# Patient Record
Sex: Male | Born: 1975
Health system: Southern US, Community
[De-identification: ages and names within clinical notes are randomized; demographics above are authoritative.]

## PROBLEM LIST (undated history)

## (undated) DIAGNOSIS — T7840XA Allergy, unspecified, initial encounter: Secondary | ICD-10-CM

## (undated) DIAGNOSIS — A770 Spotted fever due to Rickettsia rickettsii: Secondary | ICD-10-CM

## (undated) DIAGNOSIS — F419 Anxiety disorder, unspecified: Secondary | ICD-10-CM

## (undated) DIAGNOSIS — G039 Meningitis, unspecified: Secondary | ICD-10-CM

## (undated) HISTORY — DX: Allergy, unspecified, initial encounter: T78.40XA

## (undated) HISTORY — PX: WISDOM TOOTH EXTRACTION: SHX21

---

## 2009-01-19 ENCOUNTER — Emergency Department: Payer: Self-pay | Admitting: Emergency Medicine

## 2009-01-30 ENCOUNTER — Emergency Department: Payer: Self-pay | Admitting: Unknown Physician Specialty

## 2014-12-12 ENCOUNTER — Observation Stay
Admission: EM | Admit: 2014-12-12 | Discharge: 2014-12-14 | Disposition: A | Discharge status: Home / Self Care | Payer: BLUE CROSS/BLUE SHIELD | Attending: Internal Medicine | Admitting: Internal Medicine

## 2014-12-12 ENCOUNTER — Encounter: Payer: Self-pay | Admitting: Emergency Medicine

## 2014-12-12 DIAGNOSIS — R51 Headache: Secondary | ICD-10-CM | POA: Insufficient documentation

## 2014-12-12 DIAGNOSIS — R110 Nausea: Secondary | ICD-10-CM | POA: Insufficient documentation

## 2014-12-12 DIAGNOSIS — J0120 Acute ethmoidal sinusitis, unspecified: Secondary | ICD-10-CM | POA: Diagnosis not present

## 2014-12-12 DIAGNOSIS — J019 Acute sinusitis, unspecified: Secondary | ICD-10-CM | POA: Diagnosis present

## 2014-12-12 DIAGNOSIS — R509 Fever, unspecified: Secondary | ICD-10-CM | POA: Insufficient documentation

## 2014-12-12 DIAGNOSIS — Z23 Encounter for immunization: Secondary | ICD-10-CM | POA: Insufficient documentation

## 2014-12-12 DIAGNOSIS — J329 Chronic sinusitis, unspecified: Secondary | ICD-10-CM | POA: Diagnosis present

## 2014-12-12 DIAGNOSIS — M542 Cervicalgia: Secondary | ICD-10-CM | POA: Diagnosis not present

## 2014-12-12 DIAGNOSIS — G039 Meningitis, unspecified: Secondary | ICD-10-CM | POA: Diagnosis present

## 2014-12-12 MED ORDER — MORPHINE SULFATE (PF) 4 MG/ML IV SOLN
4.0000 mg | Freq: Once | INTRAVENOUS | Status: AC
  Administered 2014-12-13: 4 mg via INTRAVENOUS
  Filled 2014-12-12: qty 1

## 2014-12-12 MED ORDER — SODIUM CHLORIDE 0.9 % IV BOLUS (SEPSIS)
1000.0000 mL | Freq: Once | INTRAVENOUS | Status: AC
  Administered 2014-12-13: 1000 mL via INTRAVENOUS

## 2014-12-12 MED ORDER — ONDANSETRON 8 MG PO TBDP
ORAL_TABLET | ORAL | Status: AC
  Administered 2014-12-12: 8 mg via ORAL
  Filled 2014-12-12: qty 1

## 2014-12-12 MED ORDER — ONDANSETRON 8 MG PO TBDP
8.0000 mg | ORAL_TABLET | Freq: Once | ORAL | Status: AC
  Administered 2014-12-12: 8 mg via ORAL

## 2014-12-12 NOTE — ED Provider Notes (Signed)
[facility] Emergency Department Provider Note  ____________________________________________  Time seen: Approximately 11:32 PM  I have reviewed the triage vital signs and the nursing notes.   HISTORY  Chief Complaint Fever    HPI Khalin Royce is a 39 y.o. male resistance the emergency department complaining of nausea, fever, headache, sinus pressure, neck pain. [facility] and diagnosed with a sinus infection. He was given Augmentin at that time as well as Mucinex. He states that he has been taking both prescriptions with no relief. He reports that symptoms have increased especially over the "last few days". Patient reports increasing fever that he has been taking Tylenol and ibuprofen for without relief. He states that he "had to get a cold bath to reduce the fever." Today patient has endorsed increased nausea and vomiting. He states that he has vomited 3. Patient states that his headache is "the worst I've ever had in my life." He also endorses neck pain that "runs down the center of my neck."   History reviewed. No pertinent past medical history.  There are no active problems to display for this patient.   Past Surgical History  Procedure Laterality Date  . Wisdom tooth extraction      Current Outpatient Rx  Name  Route  Sig  Dispense  Refill  . amoxicillin-clavulanate (AUGMENTIN) 875-125 MG tablet   Oral   Take 1 tablet by mouth 2 (two) times daily.         . citalopram (CELEXA) 40 MG tablet   Oral   Take 40 mg by mouth daily.           Allergies Review of patient's allergies indicates not on file.  No family history on file.  Social History Social History  Substance Use Topics  . Smoking status: Never Smoker   . Smokeless tobacco: None  . Alcohol Use: Yes    Review of Systems Constitutional: Endorses high fever/chills Eyes: No visual changes. ENT: He endorses "scratchy" throat.  Endorses severe nasal congestion. He endorses fullness Cardiovascular: Denies chest pain. Respiratory: Denies shortness of breath. Gastrointestinal: No abdominal pain.  No nausea, no vomiting.  No diarrhea.  No constipation. Genitourinary: Negative for dysuria. Musculoskeletal: Negative for back pain. Skin: Negative for rash. Neurological: Negative for headaches, focal weakness or numbness.  10-point ROS otherwise negative.  ____________________________________________   PHYSICAL EXAM:  VITAL SIGNS: ED Triage Vitals  Enc Vitals Group     BP 12/12/14 2227 121/70 mmHg     Pulse Rate 12/12/14 2227 104     Resp 12/12/14 2227 20     Temp 12/12/14 2227 99.8 F (37.7 C)     Temp Source 12/12/14 2227 Oral     SpO2 12/12/14 2227 94 %     Weight 12/12/14 2227 240 lb (108.863 kg)     Height 12/12/14 2227  (1.778 m)     Head Cir --      Peak Flow --      Pain Score 12/12/14 2228 9     Pain Loc --      Pain Edu? --      Excl. in GC? --     Constitutional: Alert and oriented. Ill-appearing and in no mild distress. Eyes: Conjunctivae are normal. PERRL. EOMI. Head: Atraumatic. Ears: No tenderness to palpation of tragus. External ear canals nonerythematous or edematous. Moderate cerumen bilaterally but TMs visible beyond. Mild bulging but no air-fluid level. Nose: Moderate purulent  congestion/rhinnorhea. Tender to palpation over maxillary sinuses. Tender to percussion over frontal sinuses. Mouth/Throat: Mucous membranes are moist.  Oropharynx non-erythematous. Neck: No stridor.  Moderate cervical spine tenderness to palpation. Positive Kernig's and Brudzinski's. Patient is splinting neck. Hematological/Lymphatic/Immunilogical: Diffuse, mobile, anterior cervical lymphadenopathy. Cardiovascular: Normal rate, regular rhythm. Grossly normal heart sounds.  Good peripheral circulation. Respiratory: Normal respiratory effort.  No retractions. Lungs CTAB. Gastrointestinal: Soft and  nontender. No distention. No abdominal bruits. No CVA tenderness. Musculoskeletal: No lower extremity tenderness nor edema.  No joint effusions. Neurologic:  Normal speech and language. No gross focal neurologic deficits are appreciated. No gait instability. Skin:  Skin is very warm to palpation, diaphoretic and intact. No rash noted. Psychiatric: Mood and affect are normal. Speech and behavior are normal.  ____________________________________________   LABS (all labs ordered are listed, but only abnormal results are displayed)  Labs Reviewed - No data to display ____________________________________________  EKG   ____________________________________________  RADIOLOGY   ____________________________________________   PROCEDURES  Procedure(s) performed: None  Critical Care performed: No  ____________________________________________   INITIAL IMPRESSION / ASSESSMENT AND PLAN / ED COURSE  Pertinent labs & imaging results that were available during my care of the patient were reviewed by me and considered in my medical decision making (see chart for details).  The patient is a 39 year old male who presents to the emergency department complaining of nasal congestion, sinus pressure, and severe headache that is described as "worse of his life" and also "runs down the center of my neck". He also endorses a known sinus infection that was treated on 12/06/[facility]. Patient has been taking Augmentin and Mucinex for same with no improvement. Patient reports worsening of symptoms, increasing fever, and increasing headache and neck pain. Upon initial assessment of patient and the patient is a ill-appearing, moderately in distress male patient. During discussion with patient the patient was splinting his neck and moving of upper body to follow provider around the room. Patient was tender to palpation and percussion over sinuses. Moderate purulent rhinorrhea was noted.  Moderately positive Kernig's and Brudzinski's. I consulted with fellow PA Jenise. After both her assessment and we determined that the patient needed a more detailed workup. CBC, CMP, blood cultures were ordered. Patient was given symptomatic medication, Zofran, morphine, ibuprofen, and IV fluids. She did report some improvement with the morphine and Zofran  I discussed the patient's care up to this point with Dr. Zenda Alpers and the patient was transferred up to room 15. ____________________________________________   FINAL CLINICAL IMPRESSION(S) / ED DIAGNOSES  Final diagnoses:  None      Racheal Patches, PA-C 12/13/14 0101  Rebecka Apley, MD 12/13/14 [id number]

## 2014-12-12 NOTE — ED Notes (Signed)
Spoke with Christiane Ha, [place], patient feeling nauseous, new orders placed, see MAR.

## 2014-12-12 NOTE — ED Notes (Addendum)
Patient presents to ED with c/o fever since 9/27, patient was seen at [facility] and prescribed Augmentin. Patient also c/o headache since Thursday, nausea and vomiting since today. Patient reports has not been getting better. Patient took Augmentin and muccinex at noon today and last dose of Tylenol at 6:30 pm tonight without relief. Patient has vomited x 3 today. Patient alert and oriented, respirations even and unlabored, skin hot and dry. Patient denies chest pain, shortness of breath, or abdominal pain.

## 2014-12-13 ENCOUNTER — Emergency Department: Payer: BLUE CROSS/BLUE SHIELD

## 2014-12-13 DIAGNOSIS — J329 Chronic sinusitis, unspecified: Secondary | ICD-10-CM | POA: Diagnosis present

## 2014-12-13 LAB — COMPREHENSIVE METABOLIC PANEL
ALBUMIN: 4.2 g/dL (ref 3.5–5.0)
ALK PHOS: 160 U/L — AB (ref 38–126)
ALT: 225 U/L — ABNORMAL HIGH (ref 17–63)
ANION GAP: 9 (ref 5–15)
AST: 145 U/L — ABNORMAL HIGH (ref 15–41)
BUN: 15 mg/dL (ref 6–20)
CHLORIDE: 104 mmol/L (ref 101–111)
CO2: 25 mmol/L (ref 22–32)
CREATININE: 1.05 mg/dL (ref 0.61–1.24)
Calcium: 9 mg/dL (ref 8.9–10.3)
GFR calc non Af Amer: >60 mL/min (ref >60)
Glucose, Bld: 117 mg/dL — ABNORMAL HIGH (ref 65–99)
Potassium: 4.3 mmol/L (ref 3.5–5.1)
SODIUM: 138 mmol/L (ref 135–145)
Total Bilirubin: 1.4 mg/dL — ABNORMAL HIGH (ref 0.3–1.2)
Total Protein: 7.5 g/dL (ref 6.5–8.1)

## 2014-12-13 LAB — CBC WITH DIFFERENTIAL/PLATELET
BAND NEUTROPHILS: 5 %
BASOS PCT: 0 %
Basophils Absolute: 0 10*3/uL (ref 0–0.1)
Blasts: 0 %
EOS ABS: 0 10*3/uL (ref 0–0.7)
EOS PCT: 0 %
HCT: 46.9 % (ref 40.0–52.0)
HEMOGLOBIN: 15.5 g/dL (ref 13.0–18.0)
LYMPHS ABS: 2.8 10*3/uL (ref 1.0–3.6)
Lymphocytes Relative: 34 %
MCH: 29.1 pg (ref 26.0–34.0)
MCHC: 33.2 g/dL (ref 32.0–36.0)
MCV: 87.7 fL (ref 80.0–100.0)
MONO ABS: 1.1 10*3/uL — AB (ref 0.2–1.0)
MYELOCYTES: 0 %
Metamyelocytes Relative: 0 %
Monocytes Relative: 13 %
Neutro Abs: 4.3 10*3/uL (ref 1.4–6.5)
Neutrophils Relative %: 48 %
OTHER: 0 %
PLATELETS: 133 10*3/uL — AB (ref 150–440)
PROMYELOCYTES ABS: 0 %
RBC: 5.34 MIL/uL (ref 4.40–5.90)
RDW: 14 % (ref 11.5–14.5)
WBC: 8.2 10*3/uL (ref 3.8–10.6)
nRBC: 0 /100 WBC

## 2014-12-13 MED ORDER — CITALOPRAM HYDROBROMIDE 20 MG PO TABS
40.0000 mg | ORAL_TABLET | Freq: Every day | ORAL | Status: DC
  Administered 2014-12-13 – 2014-12-14 (×2): 40 mg via ORAL
  Filled 2014-12-13 (×2): qty 2

## 2014-12-13 MED ORDER — ONDANSETRON HCL 4 MG/2ML IJ SOLN
4.0000 mg | Freq: Once | INTRAMUSCULAR | Status: AC
  Administered 2014-12-13: 4 mg via INTRAVENOUS
  Filled 2014-12-13: qty 2

## 2014-12-13 MED ORDER — DM-GUAIFENESIN ER 30-600 MG PO TB12: 1.0000 | ORAL_TABLET | Freq: Two times a day (BID) | ORAL | Status: DC

## 2014-12-13 MED ORDER — ACETAMINOPHEN 325 MG PO TABS
650.0000 mg | ORAL_TABLET | Freq: Four times a day (QID) | ORAL | Status: DC | PRN
  Administered 2014-12-13: 650 mg via ORAL
  Filled 2014-12-13: qty 2

## 2014-12-13 MED ORDER — DEXTROSE 5 % IV SOLN
2.0000 g | INTRAVENOUS | Status: DC
  Filled 2014-12-13: qty 2

## 2014-12-13 MED ORDER — GUAIFENESIN ER 600 MG PO TB12
600.0000 mg | ORAL_TABLET | Freq: Two times a day (BID) | ORAL | Status: DC
  Administered 2014-12-13 – 2014-12-14 (×3): 600 mg via ORAL
  Filled 2014-12-13 (×3): qty 1

## 2014-12-13 MED ORDER — PREDNISONE 10 MG (21) PO TBPK: 10.0000 mg | ORAL_TABLET | Freq: Four times a day (QID) | ORAL | Status: DC

## 2014-12-13 MED ORDER — DEXTROSE 5 % IV SOLN: 2.0000 g | INTRAVENOUS | Status: DC

## 2014-12-13 MED ORDER — INFLUENZA VAC SPLIT QUAD 0.5 ML IM SUSY
0.5000 mL | PREFILLED_SYRINGE | INTRAMUSCULAR | Status: AC
  Administered 2014-12-14: 0.5 mL via INTRAMUSCULAR
  Filled 2014-12-13: qty 0.5

## 2014-12-13 MED ORDER — PREDNISONE 10 MG (21) PO TBPK
20.0000 mg | ORAL_TABLET | Freq: Every morning | ORAL | Status: AC
  Administered 2014-12-13: 20 mg via ORAL
  Filled 2014-12-13: qty 21

## 2014-12-13 MED ORDER — DEXTROSE 5 % IV SOLN
2.0000 g | INTRAVENOUS | Status: DC
  Administered 2014-12-14: 2 g via INTRAVENOUS
  Filled 2014-12-13: qty 2

## 2014-12-13 MED ORDER — PREDNISONE 10 MG (21) PO TBPK
10.0000 mg | ORAL_TABLET | Freq: Three times a day (TID) | ORAL | Status: DC
  Administered 2014-12-14: 10 mg via ORAL
  Filled 2014-12-13 (×2): qty 21

## 2014-12-13 MED ORDER — PREDNISONE 10 MG (21) PO TBPK: 20.0000 mg | ORAL_TABLET | Freq: Every evening | ORAL | Status: DC

## 2014-12-13 MED ORDER — OXYMETAZOLINE HCL 0.05 % NA SOLN
1.0000 | Freq: Once | NASAL | Status: AC
  Administered 2014-12-13: 1 via NASAL
  Filled 2014-12-13: qty 15

## 2014-12-13 MED ORDER — DEXTROSE 5 % IV SOLN
2.0000 g | Freq: Once | INTRAVENOUS | Status: AC
  Administered 2014-12-13: 2 g via INTRAVENOUS
  Filled 2014-12-13: qty 2

## 2014-12-13 MED ORDER — ENOXAPARIN SODIUM 40 MG/0.4ML ~~LOC~~ SOLN
40.0000 mg | SUBCUTANEOUS | Status: DC
  Administered 2014-12-13: 40 mg via SUBCUTANEOUS
  Filled 2014-12-13: qty 0.4

## 2014-12-13 MED ORDER — BUTALBITAL-APAP-CAFFEINE 50-325-40 MG PO TABS
2.0000 | ORAL_TABLET | Freq: Once | ORAL | Status: AC
  Administered 2014-12-13: 2 via ORAL
  Filled 2014-12-13: qty 2

## 2014-12-13 MED ORDER — HYDROCODONE-ACETAMINOPHEN 5-325 MG PO TABS
1.0000 | ORAL_TABLET | ORAL | Status: DC | PRN
  Administered 2014-12-13: 2 via ORAL
  Administered 2014-12-13 – 2014-12-14 (×3): 1 via ORAL
  Filled 2014-12-13: qty 1
  Filled 2014-12-13: qty 2
  Filled 2014-12-13 (×2): qty 1

## 2014-12-13 MED ORDER — LORAZEPAM 2 MG PO TABS
2.0000 mg | ORAL_TABLET | Freq: Once | ORAL | Status: AC
  Administered 2014-12-13: 2 mg via ORAL

## 2014-12-13 MED ORDER — SODIUM CHLORIDE 0.9 % IV SOLN
Freq: Once | INTRAVENOUS | Status: AC
  Administered 2014-12-13: 10:00:00 via INTRAVENOUS

## 2014-12-13 MED ORDER — PREDNISONE 10 MG (21) PO TBPK
10.0000 mg | ORAL_TABLET | ORAL | Status: AC
  Administered 2014-12-13: 10 mg via ORAL

## 2014-12-13 MED ORDER — SODIUM CHLORIDE 0.9 % IV BOLUS (SEPSIS)
INTRAVENOUS | Status: AC
Start: 2014-12-13 — End: 2014-12-13
  Administered 2014-12-13: 1000 mL via INTRAVENOUS
  Filled 2014-12-13: qty 1000

## 2014-12-13 MED ORDER — ACETAMINOPHEN 650 MG RE SUPP: 650.0000 mg | Freq: Four times a day (QID) | RECTAL | Status: DC | PRN

## 2014-12-13 MED ORDER — MORPHINE SULFATE (PF) 4 MG/ML IV SOLN
4.0000 mg | Freq: Once | INTRAVENOUS | Status: AC
  Administered 2014-12-13: 4 mg via INTRAVENOUS

## 2014-12-13 MED ORDER — IBUPROFEN 800 MG PO TABS
800.0000 mg | ORAL_TABLET | Freq: Once | ORAL | Status: AC
  Administered 2014-12-13: 800 mg via ORAL
  Filled 2014-12-13: qty 1

## 2014-12-13 MED ORDER — ONDANSETRON HCL 4 MG PO TABS: 4.0000 mg | ORAL_TABLET | Freq: Four times a day (QID) | ORAL | Status: DC | PRN

## 2014-12-13 MED ORDER — ONDANSETRON HCL 4 MG/2ML IJ SOLN: 4.0000 mg | Freq: Four times a day (QID) | INTRAMUSCULAR | Status: DC | PRN

## 2014-12-13 MED ORDER — PREDNISONE 10 MG (21) PO TBPK
20.0000 mg | ORAL_TABLET | Freq: Every evening | ORAL | Status: AC
  Administered 2014-12-13: 20 mg via ORAL

## 2014-12-13 MED ORDER — LORAZEPAM 2 MG PO TABS
ORAL_TABLET | ORAL | Status: AC
  Administered 2014-12-13: 2 mg via ORAL
  Filled 2014-12-13: qty 1

## 2014-12-13 MED ORDER — FLUTICASONE PROPIONATE 50 MCG/ACT NA SUSP
2.0000 | Freq: Every day | NASAL | Status: DC
  Administered 2014-12-13 – 2014-12-14 (×2): 2 via NASAL
  Filled 2014-12-13: qty 16

## 2014-12-13 MED ORDER — DEXTROMETHORPHAN POLISTIREX ER 30 MG/5ML PO SUER
30.0000 mg | Freq: Two times a day (BID) | ORAL | Status: DC
  Administered 2014-12-13 (×2): 30 mg via ORAL
  Filled 2014-12-13 (×5): qty 5

## 2014-12-13 MED ORDER — MORPHINE SULFATE (PF) 4 MG/ML IV SOLN
INTRAVENOUS | Status: AC
  Administered 2014-12-13: 4 mg via INTRAVENOUS
  Filled 2014-12-13: qty 1

## 2014-12-13 NOTE — ED Notes (Signed)
Patient requesting something for nausea. PA made aware, see MAR.

## 2014-12-13 NOTE — ED Notes (Signed)
Report given to Terry, RN

## 2014-12-13 NOTE — ED Notes (Signed)
Pt resting comfortably. Water provided. Abx infusing.   1A floor will call back in 5-10 mins to accept report.

## 2014-12-13 NOTE — H&P (Signed)
[facility] - [place] at [facility]   PATIENT NAME: Jon Saunders    MR#:  [id number]  DATE OF BIRTH:  1975-11-26  DATE OF ADMISSION:  12/12/2014  PRIMARY CARE PHYSICIAN: No primary care provider on file.   REQUESTING/REFERRING PHYSICIAN: Dr. Zenda Alpers  CHIEF COMPLAINT:  Gen. denies headache and fever since Thursday  HISTORY OF PRESENT ILLNESS:  Jon Saunders  is a 39 y.o. male with a known history of seasonal  sinusitis comes to the emergency room with fever since Thursday on and off. MAXIMUM TEMPERATURE was 101 at home. Patient complains of generalized headache more so behind his eyes. He was seen at walk-in clinic on Thursday and prescribed Mucinex and Augmentin. Patient continued to have headache along with fever. He also had some pain in the neck. Came to the emergency room and a CT of the head shows severe sinusitis of the ethmoid sinuses. Patient received 1 dose of IV 2 g Rocephin. He complained of some neck stiffness/pain. ER physician attempted LP which was unsuccessful. Patient currently is afebrile. He did experience some nausea and vomiting at home. He appears to be healing hemodynamically stable at present. He's being admitted for further evaluation and management. There is a question of possible meningitis.   PAST MEDICAL HISTORY:  Seasonal allergies On and off sinusitis  PAST SURGICAL HISTOIRY:   Past Surgical History  Procedure Laterality Date  . Wisdom tooth extraction      SOCIAL HISTORY:   Social History  Substance Use Topics  . Smoking status: Never Smoker   . Smokeless tobacco: Not on file  . Alcohol Use: Yes    FAMILY HISTORY:  Epilepsy  DRUG ALLERGIES:  No known drug allergy  REVIEW OF SYSTEMS:  Review of Systems  Constitutional: Positive for fever. Negative for chills and weight loss.  HENT: Negative for ear discharge, ear pain and nosebleeds.   Eyes: Negative for blurred vision, pain and discharge.  Respiratory: Negative  for sputum production, shortness of breath, wheezing and stridor.   Cardiovascular: Negative for chest pain, palpitations, orthopnea and PND.  Gastrointestinal: Negative for nausea, vomiting, abdominal pain and diarrhea.  Genitourinary: Negative for urgency and frequency.  Musculoskeletal: Negative for back pain and joint pain.  Neurological: Positive for weakness and headaches. Negative for sensory change, speech change and focal weakness.  Endo/Heme/Allergies: Positive for environmental allergies.  Psychiatric/Behavioral: Negative for depression and hallucinations. The patient is not nervous/anxious.   All other systems reviewed and are negative.    MEDICATIONS AT HOME:   Prior to Admission medications   Medication Sig Start Date End Date Taking? Authorizing Provider  amoxicillin-clavulanate (AUGMENTIN) 875-125 MG tablet Take 1 tablet by mouth 2 (two) times daily.   Yes Historical Provider, MD  citalopram (CELEXA) 40 MG tablet Take 40 mg by mouth daily.   Yes Historical Provider, MD      VITAL SIGNS:  Blood pressure 105/68, pulse 70, temperature 97.7 F (36.5 C), temperature source Oral, resp. rate 20, height  (1.778 m), weight 108.863 kg (240 lb), SpO2 97 %.  PHYSICAL EXAMINATION:  GENERAL:  39 y.o.-year-old patient lying in the bed with no acute distress.  EYES: Pupils equal, round, reactive to light and accommodation. No scleral icterus. Extraocular muscles intact.  HEENT: Head atraumatic, normocephalic. Oropharynx and nasopharynx clear.  NECK:  Supple, no jugular venous distention. No thyroid enlargement, no tenderness. Or neck stiffness LUNGS: Normal breath sounds bilaterally, no wheezing, rales,rhonchi or crepitation. No use of accessory muscles of  respiration.  CARDIOVASCULAR: S1, S2 normal. No murmurs, rubs, or gallops.  ABDOMEN: Soft, nontender, nondistended. Bowel sounds present. No organomegaly or mass.  EXTREMITIES: No pedal edema, cyanosis, or clubbing.   NEUROLOGIC: Cranial nerves II through XII are intact. Muscle strength 5/5 in all extremities. Sensation intact. Gait not checked.  PSYCHIATRIC: The patient is alert and oriented x 3.  SKIN: No obvious rash, lesion, or ulcer.   LABORATORY PANEL:   CBC  Recent Labs Lab 12/13/14 0019  WBC 8.2  HGB 15.5  HCT 46.9  PLT 133*   ------------------------------------------------------------------------------------------------------------------  Chemistries   Recent Labs Lab 12/13/14 0019  NA 138  K 4.3  CL 104  CO2 25  GLUCOSE 117*  BUN 15  CREATININE 1.05  CALCIUM 9.0  AST 145*  ALT 225*  ALKPHOS 160*  BILITOT 1.4*   ------------------------------------------------------------------------------------------------------------------  Cardiac Enzymes No results for input(s): TROPONINI in the last 168 hours. ------------------------------------------------------------------------------------------------------------------  RADIOLOGY:  Ct Head Wo Contrast  12/13/2014   CLINICAL DATA:  Nausea fever severe headache and sinus pressure. Seen in Urgent Care on 12/06/2014. Worsened.  EXAM: CT HEAD WITHOUT CONTRAST  TECHNIQUE: Contiguous axial images were obtained from the base of the skull through the vertex without intravenous contrast.  COMPARISON:  None.  FINDINGS: There is no intracranial hemorrhage, mass or evidence of acute infarction. There is no extra-axial fluid collection. Gray matter and white matter appear normal. Cerebral volume is normal for age. Brainstem and posterior fossa are unremarkable. The CSF spaces appear normal.  The bony structures are intact. There is complete opacification of many of the ethmoid air cells. There is moderate membrane thickening of the right sphenoid sinus. The maxillary sinuses are grossly clear but incompletely imaged. Frontal sinuses are clear. Mastoids and middle ears are grossly clear.  IMPRESSION: 1. Normal brain 2. Severe paranasal sinus  disease, predominantly involving the ethmoid air cells bilaterally, with milder involvement of the right sphenoid sinus. No bony destruction.   Electronically Signed   By: Ellery Plunk M.D.   On: 12/13/2014 03:03   IMPRESSION AND PLAN:   39 year old Mr. Michelle with past medical history of intermittent sinusitis comes to the emergency room with complaints of fever and headache and pain behind the eyes. He is being admitted with  1. Acute sinusitis, bilateral ethmoid sinusitis as noted on CT of the head and subjective symptoms with patient complaining of severe headaches and pain behind the eyes. Patient currently is afebrile white count normal CONTINUE IV 2 g Rocephin. ENT consultation We'll curbside infectious disease to discuss if patient needs fluoroscopy guided LP. I do not think this is meningitis.   when necessary pain meds Monitor fever curve  2. Headaches due to #1 Mucinex twice a day When necessary pain meds  3. DVT prophylaxis Lovenox   All the records are reviewed and case discussed with ED provider. Management plans discussed with the patient, family and they are in agreement.  CODE STATUS: full  TOTAL TIME TAKING CARE OF THIS PATIENT:  45 minutes.    Evelyne Makepeace M.D on 12/13/2014 at 7:18 AM  Between 7am to 6pm - Pager - [phone]  After 6pm go to [email or web address] - password [email or web address] [facility]  [place] [place] [facility]  Office  [phone]  CC: Primary care physician; No primary care provider on file.

## 2014-12-13 NOTE — ED Provider Notes (Signed)
-----------------------------------------   7:16 AM on 12/13/2014 -----------------------------------------   Blood pressure 105/68, pulse 70, temperature 97.7 F (36.5 C), temperature source Oral, resp. rate 20, height  (1.778 m), weight 240 lb (108.863 kg), SpO2 97 %.  Assuming care from [employer].  In short, Jon Saunders is a 39 y.o. male with a chief complaint of Fever .  Refer to the original H&P for additional details.  The current plan of care is to follow up the blood work and reassess the patient.   I did give the patient a dose of Fioricet for his headache as well as the morphine that he received earlier. Given the patient's complaint of this being the worse headache of his life I did do a CT scan of his head for evaluation.   CT head: Normal brain, severe when necessary nasal sinus disease predominantly involving the ethmoid air cells bilaterally with mild involvement of the right sphenoid sinus, no bony distraction.  The patient continued to have some significant pain in his head. At this time we had a discussion about possibility of meningitis and evaluation. The patient did agree to a lumbar puncture to evaluate for possible bacterial meningitis given his severe headache as well as neck stiffness he had previously.  LUMBAR PUNCTURE  Date/Time: 12/13/2014 at 500 AM Performed by: Lucrezia Europe P  Consent: Verbal consent obtained. Written consent obtained. Risks and benefits: risks, benefits and alternatives were discussed Consent given by: A shunt Patient understanding: patient states understanding of the procedure being performed  Patient consent: the patient's understanding of the procedure matches consent given  Procedure consent: procedure consent matches procedure scheduled  Relevant documents: relevant documents present and verified  Test results: test results available and properly labeled Site marked: the operative site was marked Imaging studies:  imaging studies available  Patient identity confirmed: verbally with patient and arm band  Time out: Immediately prior to procedure a "time out" was called to verify the correct patient, procedure, equipment, support staff and site/side marked as required.  Indications: Fever, headache and neck stiffness  Anesthesia: local infiltration Local anesthetic: lidocaine 1% without epinephrine Anesthetic total: 4 ml Patient sedated no  Preparation: Patient was prepped and draped in the usual sterile fashion. Lumbar space: L3-L4 interspace Patient's position: left lateral decubitus Needle gauge: 22 Needle length: 3.5 in Number of attempts: 3 I was unable to retrieve flow of cerebrospinal fluid.  Post-procedure: site cleaned and adhesive bandage applied Patient tolerance: Patient tolerated the procedure well with no immediate complications  After the failed lumbar puncture the decision was made to admit the patient to the hospitalist service for further evaluation and a fluoroscopic lumbar puncture. I will give the patient a dose of ceftriaxone 2 g IV 1 for treatment if the patient does have meningitis. Discussed this with the patient and he agrees with this plan. I contacted the admitting team and they did admit the patient to the hospitalist service.   Rebecka Apley, MD 12/13/14 [phone]

## 2014-12-13 NOTE — ED Notes (Signed)
MD Zenda Alpers at bedside, completing LP procedure

## 2014-12-14 MED ORDER — CEFUROXIME AXETIL 500 MG PO TABS: 500.0000 mg | ORAL_TABLET | Freq: Two times a day (BID) | ORAL | Status: DC

## 2014-12-14 MED ORDER — GUAIFENESIN ER 600 MG PO TB12
600.0000 mg | ORAL_TABLET | Freq: Two times a day (BID) | ORAL | Status: DC
Start: 2014-12-14 — End: 2015-01-05

## 2014-12-14 MED ORDER — FLUTICASONE PROPIONATE 50 MCG/ACT NA SUSP: 2.0000 | Freq: Every day | NASAL | Status: DC

## 2014-12-14 MED ORDER — HYDROCODONE-ACETAMINOPHEN 5-325 MG PO TABS: 1.0000 | ORAL_TABLET | ORAL | Status: DC | PRN

## 2014-12-14 MED ORDER — PREDNISONE 10 MG (21) PO TBPK: 10.0000 mg | ORAL_TABLET | Freq: Three times a day (TID) | ORAL | Status: DC

## 2014-12-14 NOTE — Discharge Summary (Signed)
[facility] [place] [facility]   PATIENT NAME: Jon Saunders    MR#:  [id number]  DATE OF BIRTH:  Mar 07, 1976  DATE OF ADMISSION:  12/12/2014 ADMITTING PHYSICIAN: Enedina Finner, MD  DATE OF DISCHARGE:   PRIMARY CARE PHYSICIAN: No primary care provider on file.    ADMISSION DIAGNOSIS:  Meningitis [G03.9] Acute sinusitis, recurrence not specified, unspecified location [J01.90]  DISCHARGE DIAGNOSIS:  Acute ethmoidal sinusitis  SECONDARY DIAGNOSIS:  History reviewed. No pertinent past medical history.  HOSPITAL COURSE:  39 year old Jon Saunders with past medical history of intermittent sinusitis comes to the emergency room with complaints of fever and headache and pain behind the eyes. He is being admitted with  1. Acute sinusitis, bilateral ethmoid sinusitis as noted on CT of the head and subjective symptoms with patient complaining of severe headaches and pain behind the eyes. Patient currently is afebrile white count normal CONTINUE IV 2 g Rocephin---Please adjust all future scheduled administration times for this order to these times:  To po ceftin for 10 days ENT consultation with Dr Jenne Campus. Pt will follow with him as out pt. No fever, pt feels better today Complete prednisone taper as instructed by ENT. flonase bid  2. Headaches due to #1 Mucinex twice a day, prn norco/tylenol When necessary pain meds  3. DVT prophylaxis Lovenox Overall improving D/c home D/w  Wife clinically pt does not have bacterial meningitis CONSULTS OBTAINED:  Treatment Team:  Linus Salmons, MD  DRUG ALLERGIES:  No Known Allergies  DISCHARGE MEDICATIONS:   Current Discharge Medication List    START taking these medications   Details  cefUROXime (CEFTIN) 500 MG tablet Take 1 tablet (500 mg total) by mouth 2 (two) times daily with a meal. Qty: 20 tablet, Refills: 0    fluticasone (FLONASE) 50 MCG/ACT nasal spray Place 2 sprays into both nostrils daily. Qty: 16  g, Refills: 2    guaiFENesin (MUCINEX) 600 MG 12 hr tablet Take 1 tablet (600 mg total) by mouth 2 (two) times daily. Qty: 20 tablet, Refills: 1    HYDROcodone-acetaminophen (NORCO/VICODIN) 5-325 MG tablet Take 1-2 tablets by mouth every 4 (four) hours as needed for moderate pain. Qty: 30 tablet, Refills: 0    predniSONE (STERAPRED UNI-PAK 21 TAB) 10 MG (21) TBPK tablet Take 1 tablet (10 mg total) by mouth 3 x daily with food. Qty: 10 tablet, Refills: 0      CONTINUE these medications which have NOT CHANGED   Details  citalopram (CELEXA) 40 MG tablet Take 40 mg by mouth daily.      STOP taking these medications     amoxicillin-clavulanate (AUGMENTIN) 875-125 MG tablet         If you experience worsening of your admission symptoms, develop shortness of breath, life threatening emergency, suicidal or homicidal thoughts you must seek medical attention immediately by calling 911 or calling your MD immediately  if symptoms less severe.  You Must read complete instructions/literature along with all the possible adverse reactions/side effects for all the Medicines you take and that have been prescribed to you. Take any new Medicines after you have completely understood and accept all the possible adverse reactions/side effects.   Please note  You were cared for by a hospitalist during your hospital stay. If you have any questions about your discharge medications or the care you received while you were in the hospital after you are discharged, you can call the unit and asked to speak with the hospitalist on  call if the hospitalist that took care of you is not available. Once you are discharged, your primary care physician will handle any further medical issues. Please note that NO REFILLS for any discharge medications will be authorized once you are discharged, as it is imperative that you return to your primary care physician (or establish a relationship with a primary care physician if you do  not have one) for your aftercare needs so that they can reassess your need for medications and monitor your lab values. Today   SUBJECTIVE   Feels better  VITAL SIGNS:  Blood pressure 118/78, pulse 75, temperature 97.5 F (36.4 C), temperature source Oral, resp. rate 18, height  (1.778 m), weight 108.863 kg (240 lb), SpO2 96 %.  I/O:   Intake/Output Summary (Last 24 hours) at 12/14/14 0753 Last data filed at 12/13/14 1600  Gross per 24 hour  Intake    741 ml  Output      0 ml  Net    741 ml    PHYSICAL EXAMINATION:  GENERAL:  39 y.o.-year-old patient lying in the bed with no acute distress.  EYES: Pupils equal, round, reactive to light and accommodation. No scleral icterus. Extraocular muscles intact.  HEENT: Head atraumatic, normocephalic. Oropharynx and nasopharynx clear.  NECK:  Supple, no jugular venous distention. No thyroid enlargement, no tenderness.  LUNGS: Normal breath sounds bilaterally, no wheezing, rales,rhonchi or crepitation. No use of accessory muscles of respiration.  CARDIOVASCULAR: S1, S2 normal. No murmurs, rubs, or gallops.  ABDOMEN: Soft, non-tender, non-distended. Bowel sounds present. No organomegaly or mass.  EXTREMITIES: No pedal edema, cyanosis, or clubbing.  NEUROLOGIC: Cranial nerves II through XII are intact. Muscle strength 5/5 in all extremities. Sensation intact. Gait not checked.  PSYCHIATRIC: The patient is alert and oriented x 3.  SKIN: No obvious rash, lesion, or ulcer.   DATA REVIEW:   CBC   Recent Labs Lab 12/13/14 0019  WBC 8.2  HGB 15.5  HCT 46.9  PLT 133*    Chemistries   Recent Labs Lab 12/13/14 0019  NA 138  K 4.3  CL 104  CO2 25  GLUCOSE 117*  BUN 15  CREATININE 1.05  CALCIUM 9.0  AST 145*  ALT 225*  ALKPHOS 160*  BILITOT 1.4*    Microbiology Results   No results found for this or any previous visit (from the past 240 hour(s)).  RADIOLOGY:  Ct Head Wo Contrast  12/13/2014   CLINICAL DATA:   Nausea fever severe headache and sinus pressure. Seen in Urgent Care on 12/06/2014. Worsened.  EXAM: CT HEAD WITHOUT CONTRAST  TECHNIQUE: Contiguous axial images were obtained from the base of the skull through the vertex without intravenous contrast.  COMPARISON:  None.  FINDINGS: There is no intracranial hemorrhage, mass or evidence of acute infarction. There is no extra-axial fluid collection. Gray matter and white matter appear normal. Cerebral volume is normal for age. Brainstem and posterior fossa are unremarkable. The CSF spaces appear normal.  The bony structures are intact. There is complete opacification of many of the ethmoid air cells. There is moderate membrane thickening of the right sphenoid sinus. The maxillary sinuses are grossly clear but incompletely imaged. Frontal sinuses are clear. Mastoids and middle ears are grossly clear.  IMPRESSION: 1. Normal brain 2. Severe paranasal sinus disease, predominantly involving the ethmoid air cells bilaterally, with milder involvement of the right sphenoid sinus. No bony destruction.   Electronically Signed   By: Rosey Bath.D.  On: 12/13/2014 03:03     Management plans discussed with the patient, family and they are in agreement.  CODE STATUS:     Code Status Orders        Start     Ordered   12/13/14 0846  Full code   Continuous     12/13/14 0845    Advance Directive Documentation        Most Recent Value   Type of Advance Directive  Healthcare Power of Attorney   Pre-existing out of facility DNR order (yellow form or pink MOST form)     "MOST" Form in Place?        TOTAL TIME TAKING CARE OF THIS PATIENT: *40 minutes.    Nation Cradle M.D on 12/14/2014 at 7:53 AM  Between 7am to 6pm - Pager - [phone] After 6pm go to [email or web address] - password [email or web address] [facility]  [place] [facility]  Office  [phone]  CC: Primary care physician; No primary care provider on file.

## 2014-12-14 NOTE — ED Notes (Signed)
Pt placed on droplet precautions at this time, with consultation with MD Zenda Alpers.

## 2014-12-18 LAB — CULTURE, BLOOD (ROUTINE X 2)
CULTURE: NO GROWTH
CULTURE: NO GROWTH

## 2015-01-05 ENCOUNTER — Encounter: Payer: Self-pay | Admitting: Internal Medicine

## 2015-01-05 ENCOUNTER — Ambulatory Visit (INDEPENDENT_AMBULATORY_CARE_PROVIDER_SITE_OTHER): Payer: BLUE CROSS/BLUE SHIELD | Admitting: Internal Medicine

## 2015-01-05 VITALS — BP 116/78 | HR 80 | Temp 98.1°F | Ht 71.0 in | Wt 247.2 lb

## 2015-01-05 DIAGNOSIS — D696 Thrombocytopenia, unspecified: Secondary | ICD-10-CM

## 2015-01-05 DIAGNOSIS — R748 Abnormal levels of other serum enzymes: Secondary | ICD-10-CM | POA: Diagnosis not present

## 2015-01-05 DIAGNOSIS — R21 Rash and other nonspecific skin eruption: Secondary | ICD-10-CM | POA: Insufficient documentation

## 2015-01-05 DIAGNOSIS — D72821 Monocytosis (symptomatic): Secondary | ICD-10-CM | POA: Insufficient documentation

## 2015-01-05 DIAGNOSIS — R509 Fever, unspecified: Secondary | ICD-10-CM

## 2015-01-05 LAB — CBC WITH DIFFERENTIAL/PLATELET
BASOS PCT: 1 % (ref 0–1)
Basophils Absolute: 0.1 10*3/uL (ref 0.0–0.1)
Eosinophils Absolute: 0.5 10*3/uL (ref 0.0–0.7)
Eosinophils Relative: 5 % (ref 0–5)
HEMATOCRIT: 44.9 % (ref 39.0–52.0)
HEMOGLOBIN: 15.2 g/dL (ref 13.0–17.0)
LYMPHS ABS: 3.5 10*3/uL (ref 0.7–4.0)
LYMPHS PCT: 39 % (ref 12–46)
MCH: 29.9 pg (ref 26.0–34.0)
MCHC: 33.9 g/dL (ref 30.0–36.0)
MCV: 88.4 fL (ref 78.0–100.0)
MONO ABS: 0.9 10*3/uL (ref 0.1–1.0)
MONOS PCT: 10 % (ref 3–12)
MPV: 10.7 fL (ref 8.6–12.4)
NEUTROS ABS: 4.1 10*3/uL (ref 1.7–7.7)
Neutrophils Relative %: 45 % (ref 43–77)
Platelets: 190 10*3/uL (ref 150–400)
RBC: 5.08 MIL/uL (ref 4.22–5.81)
RDW: 14.4 % (ref 11.5–15.5)
WBC: 9 10*3/uL (ref 4.0–10.5)

## 2015-01-05 LAB — COMPREHENSIVE METABOLIC PANEL
ALBUMIN: 4.1 g/dL (ref 3.6–5.1)
ALT: 30 U/L (ref 9–46)
AST: 22 U/L (ref 10–40)
Alkaline Phosphatase: 72 U/L (ref 40–115)
BUN: 16 mg/dL (ref 7–25)
CALCIUM: 9.1 mg/dL (ref 8.6–10.3)
CHLORIDE: 102 mmol/L (ref 98–110)
CO2: 27 mmol/L (ref 20–31)
Creat: 0.88 mg/dL (ref 0.60–1.35)
Glucose, Bld: 67 mg/dL (ref 65–99)
Potassium: 4.2 mmol/L (ref 3.5–5.3)
Sodium: 139 mmol/L (ref 135–146)
TOTAL PROTEIN: 7 g/dL (ref 6.1–8.1)
Total Bilirubin: 0.9 mg/dL (ref 0.2–1.2)

## 2015-01-05 NOTE — Progress Notes (Signed)
Patient ID: Jon Saunders, male   DOB: [birth year], 39 y.o.   MRN: [id number]         [facility]  Reason for Consult: Fever, headache and rash Referring Physician: Dr. Erline Hau  Patient Active Problem List   Diagnosis Date Noted  . Fever 01/05/2015  . Thrombocytopenia (HCC) 01/05/2015  . Elevated liver enzymes 01/05/2015  . Monocytosis 01/05/2015  . Rash 01/05/2015  . Sinusitis 12/13/2014    Patient's Medications  New Prescriptions   No medications on file  Previous Medications   CETIRIZINE (ZYRTEC) 10 MG TABLET    Take 10 mg by mouth daily.   FLUTICASONE (FLONASE) 50 MCG/ACT NASAL SPRAY    Place 2 sprays into both nostrils daily.  Modified Medications   No medications on file  Discontinued Medications   CEFUROXIME (CEFTIN) 500 MG TABLET    Take 1 tablet (500 mg total) by mouth 2 (two) times daily with a meal.   CITALOPRAM (CELEXA) 40 MG TABLET    Take 40 mg by mouth daily.   GUAIFENESIN (MUCINEX) 600 MG 12 HR TABLET    Take 1 tablet (600 mg total) by mouth 2 (two) times daily.   HYDROCODONE-ACETAMINOPHEN (NORCO/VICODIN) 5-325 MG TABLET    Take 1-2 tablets by mouth every 4 (four) hours as needed for moderate pain.   PREDNISONE (STERAPRED UNI-PAK 21 TAB) 10 MG (21) TBPK TABLET    Take 1 tablet (10 mg total) by mouth 3 x daily with food.    Recommendations: 1. Continue observation off of antibiotics and prednisone 2. CBC with differential 3. Complete metabolic panel 4. Cytomegalovirus IgM 5. Follow-up in 2 weeks   Assessment: I am not entirely certain how to put all of his recent illness together but I suspect that he did have acute ethmoid and sphenoid sinusitis with possible basilar meningitis when he was admitted to the hospital recently. He has had steady clinical improvement and radiographic improvement following antibiotic therapy. Another possibility would be that he had an acute mononucleosis syndrome with incidental sinus findings. This  could explain his mild thrombocytopenia, monocytosis and elevated liver enzymes. Cytomegalovirus mononucleosis can cause severe illness with fever and systemic symptoms that often last week to months and healthy adults. I doubt that he had acute Lyme disease. Lyme is not endemic in the [place] and his presentation is not typical of early Lyme infection.  I suspect that his more recent rash is an allergic reaction to one of his recent antibiotics. The rash started at the tail end of his antibiotic therapy and flared up after he stopped antibiotics and prednisone. The steroids may have been suppressing his allergic reaction. The rash on his lower legs looks very typical of leukocytoclastic vasculitis which is often triggered by allergic drug reactions. Overall he is much better. I favor observation off of antibiotics and prednisone for now. I will repeat his CBC and complete metabolic panel and check for acute cytomegalovirus infection. I will see him back in 2 weeks.  HPI: Jon Saunders is a 39 y.o. male who was previously healthy other than mild seasonal allergies until he developed severe retro-orbital headache in late September. He was seen at a walk-in clinic in [place], [place] and started on Mucinex and amoxicillin clavulanate for possible respiratory infection. Over the next 3 days the headache became the " worst of his life". He also began having high fever and photophobia. He then developed nausea and vomiting leading him to be admitted to [facility] 12/12/2014. A lumbar puncture was attempted but was unsuccessful. A CT scan revealed complete opacification of the ethmoid air cells and the right sphenoid sinus. He was started on empiric ceftriaxone and prednisone. He states that after starting prednisone his headache and photophobia resolved promptly. He was discharged the following day on oral cefuroxime and 3 more days of prednisone. Blood cultures done on admission (after  being on amoxicillin clavulanate) were negative. His CBC revealed mild thrombocytopenia with a platelet count of 133,000 and monocytosis. Complete metabolic panel revealed elevated AST, ALT, alkaline phosphatase and bilirubin.  He was seen by Dr. Jenne CampusMcQueen on 12/20/2014. He still had some mild pressure headache. He was started back on prednisone and his antibiotic was changed from cefuroxime to levofloxacin and clindamycin. He completed those medications on 01/01/2015. Blood work done on 12/20/2014 showed a positive Lyme IgM antibody with 3 positive bands on Western blot testing. Shortly before stopping his antibiotics and prednisone he began to noticed a diffuse truncal rash. He saw Dr. Jenne CampusMcQueen again on 01/02/2015. Repeat CT scan showed improvement in the sinus opacification. He underwent allergy testing and was started on Zyrtec. The rash on his trunk has become less red and pruritic but he has developed more pronounced rash on his lower legs bilaterally. His headache continues to improve. He has had no further fever, nausea or vomiting. He does remain extremely fatigued. He has been anorexic and believes he has lost about 10 pounds during this acute illness.  He works in [employer], [employer]. He has traveled to [place] this year on business but nowhere else out of state. He recalls having an attached tick on his left lower leg in May of this year.  Review of Systems: Constitutional: positive for anorexia, fatigue, fevers and weight loss, negative for chills and sweats Eyes: positive for photophobia Ears, nose, mouth, throat, and face: negative for ear drainage, earaches, nasal congestion, sore mouth and sore throat Respiratory: negative Cardiovascular: negative Gastrointestinal: positive for nausea and vomiting, negative for abdominal pain, change in bowel habits, constipation and diarrhea Genitourinary:positive for frequency, negative for dysuria, hematuria and  hesitancy Integument/breast: positive for pruritus and rash Hematologic/lymphatic: positive for lymphadenopathy Musculoskeletal:positive for arthralgias and neck pain, negative for myalgias and stiff joints Behavioral/Psych: negative    Past Medical History  Diagnosis Date  . Allergy     Social History  Substance Use Topics  . Smoking status: Never Smoker   . Smokeless tobacco: None  . Alcohol Use: 1.8 oz/week    3 Standard drinks or equivalent per week    Family History  Problem Relation Age of Onset  . Hypertension Mother    No Known Allergies  OBJECTIVE: Filed Vitals:   01/05/15 1149  BP: 116/78  Pulse: 80  Temp: 98.1 F (36.7 C)  TempSrc: Oral  Height: 5\' 11"  (1.803 m)  Weight: 247 lb 4 oz (112.152 kg)   Body mass index is 34.5 kg/(m^2).  General: His weight is actually up 7 pounds since he was admitted to the hospital on 12/12/2014. He is alert, well dressed and in no distress. He is accompanied by his wife. Skin: Diffuse blanching maculopapular rash on trunk and arms. Nonblanching, erythematous rash on lower legs compatible with leukocytoclastic vasculitis Lymph nodes: No palpable adenopathy Neck: Supple Ears: Clear bilaterally Eyes: Normal external exam Oral: No oropharyngeal lesions Lungs: Clear Cor: Regular S1 and S2 with no murmurs Abdomen: Soft and nontender with no palpable masses Joints and extremities: Normal Neuro:  Alert with normal speech and conversation. Cranial nerves intact. No focal weakness Mood: Normal. He does not appear anxious or depressed  Microbiology: No results found for this or any previous visit (from the past 240 hour(s)).  Cliffton Asters, MD [facility] [phone] pager   [phone] cell 01/05/2015, 12:51 PM

## 2015-01-06 LAB — CMV IGM: CMV IGM: 32.3 [AU]/ml — AB (ref <30.00)

## 2015-01-16 ENCOUNTER — Ambulatory Visit: Payer: BLUE CROSS/BLUE SHIELD | Admitting: Internal Medicine

## 2015-01-23 ENCOUNTER — Ambulatory Visit: Payer: BLUE CROSS/BLUE SHIELD | Admitting: Internal Medicine

## 2015-01-23 ENCOUNTER — Telehealth: Payer: Self-pay | Admitting: *Deleted

## 2015-01-23 NOTE — Telephone Encounter (Signed)
Pt is improved and does not need to return to [facility]

## 2015-01-31 ENCOUNTER — Other Ambulatory Visit: Payer: Self-pay

## 2016-08-24 ENCOUNTER — Emergency Department
Admission: EM | Admit: 2016-08-24 | Discharge: 2016-08-24 | Disposition: A | Discharge status: Home / Self Care | Payer: BLUE CROSS/BLUE SHIELD | Attending: Emergency Medicine | Admitting: Emergency Medicine

## 2016-08-24 ENCOUNTER — Encounter: Payer: Self-pay | Admitting: Emergency Medicine

## 2016-08-24 ENCOUNTER — Emergency Department: Payer: BLUE CROSS/BLUE SHIELD

## 2016-08-24 DIAGNOSIS — Z79899 Other long term (current) drug therapy: Secondary | ICD-10-CM | POA: Diagnosis not present

## 2016-08-24 DIAGNOSIS — R519 Headache, unspecified: Secondary | ICD-10-CM

## 2016-08-24 DIAGNOSIS — J0120 Acute ethmoidal sinusitis, unspecified: Secondary | ICD-10-CM | POA: Diagnosis not present

## 2016-08-24 DIAGNOSIS — R51 Headache: Secondary | ICD-10-CM

## 2016-08-24 MED ORDER — TRAMADOL HCL 50 MG PO TABS
50.0000 mg | ORAL_TABLET | Freq: Once | ORAL | Status: AC
  Administered 2016-08-24: 50 mg via ORAL
  Filled 2016-08-24: qty 1

## 2016-08-24 MED ORDER — FEXOFENADINE-PSEUDOEPHED ER 60-120 MG PO TB12: 1.0000 | ORAL_TABLET | Freq: Two times a day (BID) | ORAL | 0 refills | Status: DC

## 2016-08-24 MED ORDER — TRAMADOL HCL 50 MG PO TABS: 50.0000 mg | ORAL_TABLET | Freq: Four times a day (QID) | ORAL | 0 refills | Status: DC | PRN

## 2016-08-24 MED ORDER — IBUPROFEN 600 MG PO TABS
600.0000 mg | ORAL_TABLET | Freq: Once | ORAL | Status: AC
  Administered 2016-08-24: 600 mg via ORAL
  Filled 2016-08-24: qty 1

## 2016-08-24 NOTE — ED Notes (Addendum)
Pt stating that he has had a HA since yesterday. Pt stating throbbing in his anterior portion of his head. Pt stating nausea, dizziness, lightheadedness, and light sensitivity. Pt stating he has had meningitis in the past but that this HA is different. Pt is being tx currently for a sinus infection with amoxicillin.

## 2016-08-24 NOTE — ED Triage Notes (Signed)
Pt reports he has been dealing with sinus infection, reports currently on antibiotics reports severe headache since yesterday reports pain to forehead and is sharp reports been taking Tylenol,07:00, and Ibuprofen yesterday. Pt talks in complete sentences no distress noted

## 2016-08-24 NOTE — Discharge Instructions (Signed)
Continue antibiotics as directed

## 2016-08-24 NOTE — ED Provider Notes (Signed)
[facility] Emergency Department Provider Note   ____________________________________________   First MD Initiated Contact with Patient 08/24/16 1230     (approximate)  I have reviewed the triage vital signs and the nursing notes.   HISTORY  Chief Complaint Facial Pain and Headache    HPI Jon Saunders is a 41 y.o. male patient complain of acute frontal headache and photophobia. Patient state currently is being treated for sinus infection abdominal antibiotic for 3 days with no relief of his pain complaint. Patient denies weakness, vertigo, or vision disturbance. Patient state he took Tylenol this morning ibuprofen yesterday with no relief. Patient rates his pain as a 10 over 10. Patient say worse headache of her head. Past Medical History:  Diagnosis Date  . Allergy     Patient Active Problem List   Diagnosis Date Noted  . Fever 01/05/2015  . Thrombocytopenia (HCC) 01/05/2015  . Elevated liver enzymes 01/05/2015  . Monocytosis 01/05/2015  . Rash 01/05/2015  . Sinusitis 12/13/2014    Past Surgical History:  Procedure Laterality Date  . WISDOM TOOTH EXTRACTION      Prior to Admission medications   Medication Sig Start Date End Date Taking? Authorizing Provider  cetirizine (ZYRTEC) 10 MG tablet Take 10 mg by mouth daily.    [provider]  fexofenadine-pseudoephedrine (ALLEGRA-D) 60-120 MG 12 hr tablet Take 1 tablet by mouth 2 (two) times daily. 08/24/16   Joni Reining, PA-C  fluticasone (FLONASE) 50 MCG/ACT nasal spray Place 2 sprays into both nostrils daily. 12/14/14   Enedina Finner, MD  traMADol (ULTRAM) 50 MG tablet Take 1 tablet (50 mg total) by mouth every 6 (six) hours as needed. 08/24/16 08/24/17  Joni Reining, PA-C    Allergies Patient has no known allergies.  Family History  Problem Relation Age of Onset  . Hypertension Mother     Social History Social History  Substance Use Topics  . Smoking status: Never Smoker  .  Smokeless tobacco: Never Used  . Alcohol use 1.8 oz/week    3 Standard drinks or equivalent per week     Comment: every couple days     Review of Systems  Constitutional: No fever/chills Eyes: No visual changes. ENT: No sore throat. Nasal congestion and facial pain. Cardiovascular: Denies chest pain. Respiratory: Denies shortness of breath. Gastrointestinal: No abdominal pain.  No nausea, no vomiting.  No diarrhea.  No constipation. Genitourinary: Negative for dysuria. Musculoskeletal: Negative for back pain. Skin: Negative for rash. Neurological: Positive for headaches, but denies focal weakness or numbness.  ____________________________________________   PHYSICAL EXAM:  VITAL SIGNS: ED Triage Vitals  Enc Vitals Group     BP 08/24/16 1205 119/69     Pulse Rate 08/24/16 1205 79     Resp 08/24/16 1205 20     Temp --      Temp src --      SpO2 08/24/16 1205 97 %     Weight 08/24/16 1205 250 lb (113.4 kg)     Height 08/24/16 1205 5\' 10"  (1.778 m)     Head Circumference --      Peak Flow --      Pain Score 08/24/16 1204 10     Pain Loc --      Pain Edu? --      Excl. in GC? --     Constitutional: Alert and oriented. Moderate distress  Eyes: Conjunctivae are normal. Photophobic  Head: Atraumatic. Nose: Edematous nasal turbinates. Moderate guarding  with palpation of frontal sinuses. Mouth/Throat: Mucous membranes are moist.  Oropharynx non-erythematous. Neck: No stridor.  No cervical spine tenderness to palpation. Hematological/Lymphatic/Immunilogical: No cervical lymphadenopathy. Cardiovascular: Normal rate, regular rhythm. Grossly normal heart sounds.  Good peripheral circulation. Respiratory: Normal respiratory effort.  No retractions. Lungs CTAB. Neurologic:  Normal speech and language. No gross focal neurologic deficits are appreciated. No gait instability. Skin:  Skin is warm, dry and intact. No rash noted. Psychiatric: Mood and affect are normal. Speech and  behavior are normal.  ____________________________________________   LABS (all labs ordered are listed, but only abnormal results are displayed)  Labs Reviewed - No data to display ____________________________________________  EKG   ____________________________________________  RADIOLOGY  No results found.  _His CT unremarkable for severe ethmoid sinusitis ___________________________________________   PROCEDURES  Procedure(s) performed: None  Procedures  Critical Care performed: No  ____________________________________________   INITIAL IMPRESSION / ASSESSMENT AND PLAN / ED COURSE  Pertinent labs & imaging results that were available during my care of the patient were reviewed by me and considered in my medical decision making (see chart for details).  Sinusitis and headache. Discuss CT findings with patient. Patient advised to continue antibiotics. Patient advised to start Allegra-D and tramadol as directed. Patient advised return by ER for condition worsens.      ____________________________________________   FINAL CLINICAL IMPRESSION(S) / ED DIAGNOSES  Final diagnoses:  Acute non-recurrent ethmoidal sinusitis  Sinus headache      NEW MEDICATIONS STARTED DURING THIS VISIT:  New Prescriptions   FEXOFENADINE-PSEUDOEPHEDRINE (ALLEGRA-D) 60-120 MG 12 HR TABLET    Take 1 tablet by mouth 2 (two) times daily.   TRAMADOL (ULTRAM) 50 MG TABLET    Take 1 tablet (50 mg total) by mouth every 6 (six) hours as needed.     Note:  This document was prepared using Dragon voice recognition software and may include unintentional dictation errors.    Joni ReiningSmith, Graylen Noboa K, PA-C 08/24/16 1324    Schaevitz, Myra Rudeavid Matthew, MD 08/24/16 [phone]

## 2016-08-25 ENCOUNTER — Observation Stay
Admission: EM | Admit: 2016-08-25 | Discharge: 2016-08-26 | Disposition: A | Discharge status: Home / Self Care | Payer: BLUE CROSS/BLUE SHIELD | Attending: Internal Medicine | Admitting: Internal Medicine

## 2016-08-25 ENCOUNTER — Inpatient Hospital Stay: Payer: BLUE CROSS/BLUE SHIELD

## 2016-08-25 ENCOUNTER — Encounter: Payer: Self-pay | Admitting: Emergency Medicine

## 2016-08-25 DIAGNOSIS — J329 Chronic sinusitis, unspecified: Secondary | ICD-10-CM | POA: Insufficient documentation

## 2016-08-25 DIAGNOSIS — Z7951 Long term (current) use of inhaled steroids: Secondary | ICD-10-CM | POA: Insufficient documentation

## 2016-08-25 DIAGNOSIS — R519 Headache, unspecified: Secondary | ICD-10-CM | POA: Diagnosis present

## 2016-08-25 DIAGNOSIS — Z79899 Other long term (current) drug therapy: Secondary | ICD-10-CM | POA: Insufficient documentation

## 2016-08-25 DIAGNOSIS — R739 Hyperglycemia, unspecified: Secondary | ICD-10-CM | POA: Insufficient documentation

## 2016-08-25 DIAGNOSIS — F329 Major depressive disorder, single episode, unspecified: Secondary | ICD-10-CM | POA: Diagnosis not present

## 2016-08-25 DIAGNOSIS — A879 Viral meningitis, unspecified: Principal | ICD-10-CM | POA: Insufficient documentation

## 2016-08-25 DIAGNOSIS — R51 Headache: Secondary | ICD-10-CM | POA: Diagnosis not present

## 2016-08-25 HISTORY — DX: Anxiety disorder, unspecified: F41.9

## 2016-08-25 LAB — CSF CELL COUNT WITH DIFFERENTIAL
EOS CSF: 0 %
EOS CSF: UNDETERMINED %
Lymphs, CSF: 77 %
Lymphs, CSF: UNDETERMINED %
Monocyte-Macrophage-Spinal Fluid: 21 %
Monocyte-Macrophage-Spinal Fluid: UNDETERMINED %
OTHER CELLS CSF: 0
Other Cells, CSF: UNDETERMINED
RBC Count, CSF: 56 /mm3 — ABNORMAL HIGH (ref 0–3)
RBC Count, CSF: 75 /mm3 — ABNORMAL HIGH (ref 0–3)
SEGMENTED NEUTROPHILS-CSF: UNDETERMINED %
Segmented Neutrophils-CSF: 2 %
TUBE #: 1
TUBE #: 4
WBC, CSF: 24 /mm3 (ref 0–5)
WBC, CSF: 26 /mm3 (ref 0–5)

## 2016-08-25 LAB — COMPREHENSIVE METABOLIC PANEL
ALBUMIN: 4.6 g/dL (ref 3.5–5.0)
ALK PHOS: 65 U/L (ref 38–126)
ALT: 22 U/L (ref 17–63)
AST: 22 U/L (ref 15–41)
Anion gap: 8 (ref 5–15)
BUN: 21 mg/dL — AB (ref 6–20)
CO2: 26 mmol/L (ref 22–32)
CREATININE: 1.03 mg/dL (ref 0.61–1.24)
Calcium: 9.2 mg/dL (ref 8.9–10.3)
Chloride: 102 mmol/L (ref 101–111)
GFR calc Af Amer: >60 mL/min (ref >60)
GLUCOSE: 105 mg/dL — AB (ref 65–99)
POTASSIUM: 4.2 mmol/L (ref 3.5–5.1)
Sodium: 136 mmol/L (ref 135–145)
Total Bilirubin: 1.2 mg/dL (ref 0.3–1.2)
Total Protein: 8.1 g/dL (ref 6.5–8.1)

## 2016-08-25 LAB — LACTIC ACID, PLASMA: LACTIC ACID, VENOUS: 0.9 mmol/L (ref 0.5–1.9)

## 2016-08-25 LAB — CBC WITH DIFFERENTIAL/PLATELET
BASOS ABS: 0.1 10*3/uL (ref 0–0.1)
BASOS PCT: 1 %
Eosinophils Absolute: 0.1 10*3/uL (ref 0–0.7)
Eosinophils Relative: 0 %
HEMATOCRIT: 46.4 % (ref 40.0–52.0)
HEMOGLOBIN: 15.5 g/dL (ref 13.0–18.0)
LYMPHS PCT: 11 %
Lymphs Abs: 1.4 10*3/uL (ref 1.0–3.6)
MCH: 29 pg (ref 26.0–34.0)
MCHC: 33.4 g/dL (ref 32.0–36.0)
MCV: 86.7 fL (ref 80.0–100.0)
MONO ABS: 0.5 10*3/uL (ref 0.2–1.0)
Monocytes Relative: 4 %
NEUTROS ABS: 10.5 10*3/uL — AB (ref 1.4–6.5)
NEUTROS PCT: 84 %
Platelets: 208 10*3/uL (ref 150–440)
RBC: 5.35 MIL/uL (ref 4.40–5.90)
RDW: 13.3 % (ref 11.5–14.5)
WBC: 12.5 10*3/uL — AB (ref 3.8–10.6)

## 2016-08-25 LAB — PROTEIN AND GLUCOSE, CSF
Glucose, CSF: 53 mg/dL (ref 40–70)
Total  Protein, CSF: 125 mg/dL — ABNORMAL HIGH (ref 15–45)

## 2016-08-25 MED ORDER — ONDANSETRON HCL 4 MG/2ML IJ SOLN
4.0000 mg | Freq: Four times a day (QID) | INTRAMUSCULAR | Status: DC | PRN
Start: 2016-08-25 — End: 2016-08-26

## 2016-08-25 MED ORDER — ACETAMINOPHEN 650 MG RE SUPP: 650.0000 mg | Freq: Four times a day (QID) | RECTAL | Status: DC | PRN

## 2016-08-25 MED ORDER — KETOROLAC TROMETHAMINE 30 MG/ML IJ SOLN
30.0000 mg | Freq: Once | INTRAMUSCULAR | Status: AC
  Administered 2016-08-25: 30 mg via INTRAVENOUS
  Filled 2016-08-25: qty 1

## 2016-08-25 MED ORDER — HYDROMORPHONE HCL 1 MG/ML IJ SOLN
1.0000 mg | Freq: Once | INTRAMUSCULAR | Status: AC
  Administered 2016-08-25: 1 mg via INTRAVENOUS
  Filled 2016-08-25: qty 1

## 2016-08-25 MED ORDER — SENNOSIDES-DOCUSATE SODIUM 8.6-50 MG PO TABS: 1.0000 | ORAL_TABLET | Freq: Every evening | ORAL | Status: DC | PRN

## 2016-08-25 MED ORDER — LORAZEPAM 2 MG/ML IJ SOLN
1.0000 mg | Freq: Once | INTRAMUSCULAR | Status: AC
  Administered 2016-08-25: 1 mg via INTRAVENOUS
  Filled 2016-08-25: qty 1

## 2016-08-25 MED ORDER — ONDANSETRON HCL 4 MG PO TABS: 4.0000 mg | ORAL_TABLET | Freq: Four times a day (QID) | ORAL | Status: DC | PRN

## 2016-08-25 MED ORDER — DEXTROSE 5 % IV SOLN
1.0000 g | INTRAVENOUS | Status: AC
  Administered 2016-08-25: 1 g via INTRAVENOUS
  Filled 2016-08-25: qty 10

## 2016-08-25 MED ORDER — SODIUM CHLORIDE 0.9 % IV SOLN
INTRAVENOUS | Status: DC
  Administered 2016-08-25 (×2): via INTRAVENOUS

## 2016-08-25 MED ORDER — AMOXICILLIN 500 MG PO CAPS
500.0000 mg | ORAL_CAPSULE | Freq: Three times a day (TID) | ORAL | Status: DC
  Administered 2016-08-25 – 2016-08-26 (×4): 500 mg via ORAL
  Filled 2016-08-25 (×4): qty 1

## 2016-08-25 MED ORDER — LIDOCAINE HCL (PF) 1 % IJ SOLN
5.0000 mL | Freq: Once | INTRAMUSCULAR | Status: AC
  Administered 2016-08-25: 5 mL
  Filled 2016-08-25: qty 5

## 2016-08-25 MED ORDER — KETOROLAC TROMETHAMINE 30 MG/ML IJ SOLN
30.0000 mg | Freq: Four times a day (QID) | INTRAMUSCULAR | Status: DC | PRN
  Administered 2016-08-25: 30 mg via INTRAVENOUS
  Filled 2016-08-25: qty 1

## 2016-08-25 MED ORDER — SUMATRIPTAN SUCCINATE 50 MG PO TABS
50.0000 mg | ORAL_TABLET | ORAL | Status: DC | PRN
  Administered 2016-08-26: 02:00:00 50 mg via ORAL
  Filled 2016-08-25: qty 1
  Filled 2016-08-25: qty 2

## 2016-08-25 MED ORDER — LORATADINE 10 MG PO TABS
10.0000 mg | ORAL_TABLET | Freq: Every day | ORAL | Status: DC
  Administered 2016-08-25 – 2016-08-26 (×2): 10 mg via ORAL
  Filled 2016-08-25 (×2): qty 1

## 2016-08-25 MED ORDER — CITALOPRAM HYDROBROMIDE 20 MG PO TABS
40.0000 mg | ORAL_TABLET | Freq: Every day | ORAL | Status: DC
  Administered 2016-08-25 – 2016-08-26 (×2): 40 mg via ORAL
  Filled 2016-08-25 (×2): qty 2

## 2016-08-25 MED ORDER — FLUTICASONE PROPIONATE 50 MCG/ACT NA SUSP
2.0000 | Freq: Every day | NASAL | Status: DC
  Administered 2016-08-25 – 2016-08-26 (×2): 2 via NASAL
  Filled 2016-08-25: qty 16

## 2016-08-25 MED ORDER — ACETAMINOPHEN 325 MG PO TABS: 650.0000 mg | ORAL_TABLET | Freq: Four times a day (QID) | ORAL | Status: DC | PRN

## 2016-08-25 MED ORDER — DEXTROSE 5 % IV SOLN
2.0000 g | Freq: Two times a day (BID) | INTRAVENOUS | Status: DC
  Administered 2016-08-25 – 2016-08-26 (×2): 2 g via INTRAVENOUS
  Filled 2016-08-25 (×3): qty 2

## 2016-08-25 MED ORDER — DEXAMETHASONE SODIUM PHOSPHATE 10 MG/ML IJ SOLN
10.0000 mg | Freq: Once | INTRAMUSCULAR | Status: AC
  Administered 2016-08-25: 10 mg via INTRAVENOUS
  Filled 2016-08-25: qty 1

## 2016-08-25 MED ORDER — DIPHENHYDRAMINE HCL 50 MG/ML IJ SOLN
25.0000 mg | Freq: Once | INTRAMUSCULAR | Status: AC
  Administered 2016-08-25: 25 mg via INTRAVENOUS
  Filled 2016-08-25: qty 1

## 2016-08-25 MED ORDER — METOCLOPRAMIDE HCL 5 MG/ML IJ SOLN
10.0000 mg | Freq: Once | INTRAMUSCULAR | Status: AC
  Administered 2016-08-25: 10 mg via INTRAVENOUS
  Filled 2016-08-25: qty 2

## 2016-08-25 MED ORDER — DEXTROSE 5 % IV SOLN
1.0000 g | Freq: Once | INTRAVENOUS | Status: AC
  Administered 2016-08-25: 1 g via INTRAVENOUS
  Filled 2016-08-25: qty 10

## 2016-08-25 MED ORDER — OXYCODONE-ACETAMINOPHEN 5-325 MG PO TABS
1.0000 | ORAL_TABLET | Freq: Once | ORAL | Status: AC
  Administered 2016-08-25: 1 via ORAL
  Filled 2016-08-25: qty 1

## 2016-08-25 MED ORDER — SODIUM CHLORIDE 0.9 % IV SOLN
Freq: Once | INTRAVENOUS | Status: AC
  Administered 2016-08-25: 05:00:00 via INTRAVENOUS

## 2016-08-25 MED ORDER — SALINE SPRAY 0.65 % NA SOLN
1.0000 | NASAL | Status: DC | PRN
  Administered 2016-08-26: 06:00:00 1 via NASAL
  Filled 2016-08-25 (×2): qty 44

## 2016-08-25 MED ORDER — ONDANSETRON 4 MG PO TBDP
4.0000 mg | ORAL_TABLET | Freq: Once | ORAL | Status: AC | PRN
  Administered 2016-08-25: 4 mg via ORAL
  Filled 2016-08-25: qty 1

## 2016-08-25 MED ORDER — ENOXAPARIN SODIUM 40 MG/0.4ML ~~LOC~~ SOLN
40.0000 mg | SUBCUTANEOUS | Status: DC
  Administered 2016-08-25: 20:00:00 40 mg via SUBCUTANEOUS
  Filled 2016-08-25: qty 0.4

## 2016-08-25 MED ORDER — HYDROCODONE-ACETAMINOPHEN 5-325 MG PO TABS
1.0000 | ORAL_TABLET | ORAL | Status: DC | PRN
  Administered 2016-08-25 (×3): 1 via ORAL
  Administered 2016-08-26: 2 via ORAL
  Filled 2016-08-25 (×3): qty 2
  Filled 2016-08-25 (×2): qty 1

## 2016-08-25 NOTE — ED Provider Notes (Signed)
[facility] Emergency Department Provider Note       Time seen: ----------------------------------------- 4:41 AM on 08/25/2016 -----------------------------------------     I have reviewed the triage vital signs and the nursing notes.   HISTORY   Chief Complaint Headache and Emesis    HPI Jon Saunders is a 41 y.o. male who presents to the ED for severe headache. Patient describes pain as 10 out of 10 behind both eyes. Nothing makes the pain better or worse. Patient was seen in the ER earlier this evening and started treatment for sinusitis. He denies fevers or chills, has had some vomiting and couldn't keep anything down earlier. This happened several years ago where he was diagnosed with pansinusitis.   Past Medical History:  Diagnosis Date  . Allergy     Patient Active Problem List   Diagnosis Date Noted  . Fever 01/05/2015  . Thrombocytopenia (HCC) 01/05/2015  . Elevated liver enzymes 01/05/2015  . Monocytosis 01/05/2015  . Rash 01/05/2015  . Sinusitis 12/13/2014    Past Surgical History:  Procedure Laterality Date  . WISDOM TOOTH EXTRACTION      Allergies Patient has no known allergies.  Social History Social History  Substance Use Topics  . Smoking status: Never Smoker  . Smokeless tobacco: Never Used  . Alcohol use 1.8 oz/week    3 Standard drinks or equivalent per week     Comment: every couple days     Review of Systems Constitutional: Negative for fever. Eyes: Negative for vision changes ENT:  Positive for sinus congestion and pressure Cardiovascular: Negative for chest pain. Respiratory: Negative for shortness of breath. Gastrointestinal: Negative for abdominal pain, positive for vomiting Genitourinary: Negative for dysuria. Musculoskeletal: Negative for back pain. Skin: Negative for rash. Neurological: Positive for headache  All systems negative/normal/unremarkable except as stated in the  HPI  ____________________________________________   PHYSICAL EXAM:  VITAL SIGNS: ED Triage Vitals  Enc Vitals Group     BP 08/25/16 0141 129/74     Pulse Rate 08/25/16 0141 89     Resp 08/25/16 0141 18     Temp 08/25/16 0141 98.4 F (36.9 C)     Temp Source 08/25/16 0141 Oral     SpO2 08/25/16 0141 97 %     Weight 08/25/16 0142 250 lb (113.4 kg)     Height 08/25/16 0142 5\' 10"  (1.778 m)     Head Circumference --      Peak Flow --      Pain Score 08/25/16 0140 10     Pain Loc --      Pain Edu? --      Excl. in GC? --     Constitutional: Alert and oriented. Mild distress Eyes: Conjunctivae are normal. Normal extraocular movements. Mild photophobia is noted ENT   Head: Normocephalic and atraumatic.   Nose: No congestion/rhinnorhea.   Mouth/Throat: Mucous membranes are moist.   Neck: No stridor. Cardiovascular: Normal rate, regular rhythm. No murmurs, rubs, or gallops. Respiratory: Normal respiratory effort without tachypnea nor retractions. Breath sounds are clear and equal bilaterally. No wheezes/rales/rhonchi. Gastrointestinal: Soft and nontender. Normal bowel sounds Musculoskeletal: Nontender with normal range of motion in extremities. No lower extremity tenderness nor edema. Neurologic:  Normal speech and language. No gross focal neurologic deficits are appreciated.  Skin:  Skin is warm, dry and intact. No rash noted. Psychiatric: Mood and affect are normal. Speech and behavior are normal.  ___________________________________________  ED COURSE:  Pertinent labs & imaging results that  were available during my care of the patient were reviewed by me and considered in my medical decision making (see chart for details). Patient presents for headache, we will assess with labs and imaging as indicated.   .Lumbar Puncture Date/Time: 08/25/2016 6:18 AM Performed by: Emily Filbert Authorized by: Daryel November E   Consent:    Consent obtained:   Verbal and written   Consent given by:  Patient Pre-procedure details:    Procedure purpose:  Diagnostic Sedation:    Sedation type:  Anxiolysis Anesthesia (see MAR for exact dosages):    Anesthesia method:  Local infiltration   Local anesthetic:  Lidocaine 1% w/o epi Procedure details:    Lumbar space:  L3-L4 interspace   Patient position:  L lateral decubitus   Needle gauge:  20   Ultrasound guidance: no     Number of attempts:  1   Opening pressure (cm H2O):  25   Fluid appearance:  Clear   Tubes of fluid:  4   Total volume (ml):  6 Post-procedure:    Puncture site:  Adhesive bandage applied   Patient tolerance of procedure:  Tolerated well, no immediate complications   ____________________________________________   LABS (pertinent positives/negatives)  Labs Reviewed  CBC WITH DIFFERENTIAL/PLATELET - Abnormal; Notable for the following:       Result Value   WBC 12.5 (*)    Neutro Abs 10.5 (*)    All other components within normal limits  COMPREHENSIVE METABOLIC PANEL - Abnormal; Notable for the following:    Glucose, Bld 105 (*)    BUN 21 (*)    All other components within normal limits  CSF CULTURE  GRAM STAIN  CULTURE, BLOOD (ROUTINE X 2)  CULTURE, BLOOD (ROUTINE X 2)  CSF CELL COUNT WITH DIFFERENTIAL  CSF CELL COUNT WITH DIFFERENTIAL  PROTEIN AND GLUCOSE, CSF  HIV ANTIBODY (ROUTINE TESTING)  HERPES SIMPLEX VIRUS(HSV) DNA BY PCR  LACTIC ACID, PLASMA  LACTIC ACID, PLASMA    RADIOLOGY  CT head IMPRESSION: No acute intracranial abnormality.  Paranasal sinus disease.  ____________________________________________  FINAL ASSESSMENT AND PLAN  Acute intractable headache, sinusitis, Lumbar puncture  Plan: Patient's labs and imaging were dictated above. Patient had presented for severe acute headache, had been seen earlier in the day and was found to have sinus disease on CT. Labs here revealed mildly elevated white count and he was afebrile but his  headache is intractable. We tried numerous headache medications without any improvement. Decision was made to perform a lumbar puncture which he consented to. CSF results are pending at this time.   Emily Filbert, MD   Note: This note was generated in part or whole with voice recognition software. Voice recognition is usually quite accurate but there are transcription errors that can and very often do occur. I apologize for any typographical errors that were not detected and corrected.     Emily Filbert, MD 08/25/16 [phone]

## 2016-08-25 NOTE — H&P (Signed)
[facility] - [place] [facility]   PATIENT NAME: Jon Saunders    MR#:  [id number]  DATE OF BIRTH:  May 16, 1975  DATE OF ADMISSION:  08/25/2016  PRIMARY CARE PHYSICIAN: System, Pcp Not In   REQUESTING/REFERRING PHYSICIAN: Dr Mayford Knife  CHIEF COMPLAINT:   headache HISTORY OF PRESENT ILLNESS:  Add Dinapoli  is a 41 y.o. male with depression who presents for the second time today to ED for intractable headache. Currently his pain is 5/10. Sneezing, coughing, lights, sitting up make it worse and lying quietly makes it some what better. Oral and pian medications have not been effective for his headache. He developed heaache Friday. He is currently being treated for sinusitis with amoxicillin. LP results are pending. CT head shows no acute pathology.  PAST MEDICAL HISTORY:   Past Medical History:  Diagnosis Date  . Allergy    depression PAST SURGICAL HISTORY:   Past Surgical History:  Procedure Laterality Date  . WISDOM TOOTH EXTRACTION      SOCIAL HISTORY:   Social History  Substance Use Topics  . Smoking status: Never Smoker  . Smokeless tobacco: Never Used  . Alcohol use 1.8 oz/week    3 Standard drinks or equivalent per week     Comment: every couple days     FAMILY HISTORY:   Family History  Problem Relation Age of Onset  . Hypertension Mother     DRUG ALLERGIES:  No Known Allergies  REVIEW OF SYSTEMS:   Review of Systems  Constitutional: Negative.  Negative for chills, fever and malaise/fatigue.  HENT: Negative for ear discharge, ear pain, hearing loss, nosebleeds and sore throat.   Eyes: Negative.  Negative for blurred vision and pain.  Respiratory: Negative.  Negative for cough, hemoptysis, shortness of breath and wheezing.   Cardiovascular: Negative.  Negative for chest pain, palpitations and leg swelling.  Gastrointestinal: Positive for nausea and vomiting. Negative for abdominal pain, blood in stool and diarrhea.  Genitourinary:  Negative.  Negative for dysuria.  Musculoskeletal: Negative.  Negative for back pain.  Skin: Negative.   Neurological: Positive for headaches. Negative for dizziness, tremors, speech change, focal weakness and seizures.  Endo/Heme/Allergies: Negative.  Does not bruise/bleed easily.  Psychiatric/Behavioral: Negative.  Negative for depression, hallucinations and suicidal ideas.    MEDICATIONS AT HOME:   Prior to Admission medications   Medication Sig Start Date End Date Taking? Authorizing Provider  amoxicillin (AMOXIL) 875 MG tablet Take 875 mg by mouth 2 (two) times daily. for 10 days   Yes [provider]  citalopram (CELEXA) 40 MG tablet Take 40 mg by mouth daily.   Yes [provider]  fexofenadine-pseudoephedrine (ALLEGRA-D) 60-120 MG 12 hr tablet Take 1 tablet by mouth 2 (two) times daily. 08/24/16  Yes Joni Reining, PA-C  fluticasone (FLONASE) 50 MCG/ACT nasal spray Place 2 sprays into both nostrils daily. 12/14/14  Yes Enedina Finner, MD  traMADol (ULTRAM) 50 MG tablet Take 1 tablet (50 mg total) by mouth every 6 (six) hours as needed. 08/24/16 08/24/17 Yes Joni Reining, PA-C      VITAL SIGNS:  Blood pressure 107/75, pulse 74, temperature 98.4 F (36.9 C), temperature source Oral, resp. rate 18, height 5\' 10"  (1.778 m), weight 113.4 kg (250 lb), SpO2 91 %.  PHYSICAL EXAMINATION:   Physical Exam  Constitutional: He is oriented to person, place, and time and well-developed, well-nourished, and in no distress. No distress.  HENT:  Head: Normocephalic.  Eyes: No scleral icterus.  Neck: Normal range of motion. Neck supple. No JVD present. No tracheal deviation present.  Cardiovascular: Normal rate, regular rhythm and normal heart sounds.  Exam reveals no gallop and no friction rub.   No murmur heard. Pulmonary/Chest: Effort normal and breath sounds normal. No respiratory distress. He has no wheezes. He has no rales. He exhibits no tenderness.  Abdominal: Soft.  Bowel sounds are normal. He exhibits no distension and no mass. There is no tenderness. There is no rebound and no guarding.  Musculoskeletal: Normal range of motion. He exhibits no edema.  Neurological: He is alert and oriented to person, place, and time.  Skin: Skin is warm. No rash noted. No erythema.  Psychiatric: Affect and judgment normal.      LABORATORY PANEL:   CBC  Recent Labs Lab 08/25/16 0448  WBC 12.5*  HGB 15.5  HCT 46.4  PLT 208   ------------------------------------------------------------------------------------------------------------------  Chemistries   Recent Labs Lab 08/25/16 0448  NA 136  K 4.2  CL 102  CO2 26  GLUCOSE 105*  BUN 21*  CREATININE 1.03  CALCIUM 9.2  AST 22  ALT 22  ALKPHOS 65  BILITOT 1.2   ------------------------------------------------------------------------------------------------------------------  Cardiac Enzymes No results for input(s): TROPONINI in the last 168 hours. ------------------------------------------------------------------------------------------------------------------  RADIOLOGY:  Ct Head Wo Contrast  Result Date: 08/24/2016 CLINICAL DATA:  Acute onset of headache. Being treated for sinus infection. EXAM: CT HEAD WITHOUT CONTRAST TECHNIQUE: Contiguous axial images were obtained from the base of the skull through the vertex without intravenous contrast. COMPARISON:  12/13/2014 FINDINGS: Brain: No evidence of acute infarction, hemorrhage, hydrocephalus, extra-axial collection or mass lesion/mass effect. Vascular: No hyperdense vessel or unexpected calcification. Skull: Normal. Negative for fracture or focal lesion. Sinuses/Orbits: Extensive mucosal thickening in the bilateral ethmoid air cells. Mucosal disease in the bilateral maxillary sinuses, right side greater than left. Minimal disease in the sphenoid sinuses. Other: None. IMPRESSION: No acute intracranial abnormality. Paranasal sinus disease.  Electronically Signed   By: Richarda OverlieAdam  Henn M.D.   On: 08/24/2016 13:38    EKG:  No orders found for this or any previous visit.  IMPRESSION AND PLAN:   41 year old male who presents with intractable headache.   1. Intractable headache: Patient endorses photophobia and neck stiffness however on examination neck is supple. Lumbar puncture results are pending Continue Rocephin Order MRI/MRA brain to rule out aneurysm Follow-up an LP cultures and results Neurology consultation Try Imitrex When necessary supportive pain medications and antiemetics  2. Hyperglycemia: Recheck in a.m.  3. Depression: Continue Celexa  4. Sinusitis: Patient is on Rocephin  ry ocean spray  All the records are reviewed and case discussed with ED provider. Management plans discussed with the patient and he in agreement  CODE STATUS: full  TOTAL TIME TAKING CARE OF THIS PATIENT: 50 minutes.    Arrick Dutton M.D on 08/25/2016 at 7:04 AM  Between 7am to 6pm - Pager - [phone]  After 6pm go to [email or web address] - password [email or web address]  [facility]  Office  [phone]  CC: Primary care physician; System, Pcp Not In

## 2016-08-25 NOTE — Consult Note (Signed)
Reason for Consult:Headache Referring Physician: Mody  CC: Headache  HPI: Jon Saunders is an 41 y.o. male who reports that he has recently been treated for a sinus infection.  On Friday began with a severe headache.  Unable to be relieved with any OTC medications.  Describes headache as frontal, sharp and throbbing.  Headache associated with nausea, vomiting and photophobia.  Was seen in the ED on yesterday and given Allegra and Tramadol.  Tramadol caused vomiting and patient returned with continued severe headache.  LP performed and patient admitted at that time.    Patient reports a similar episode about 2 years ago.  LP unable to be performed and diagnosis unable to be made at that time.     Past Medical History:  Diagnosis Date  . Allergy   . Anxiety     Past Surgical History:  Procedure Laterality Date  . WISDOM TOOTH EXTRACTION      Family History  Problem Relation Age of Onset  . Hypertension Mother     Social History:  reports that he has never smoked. He has never used smokeless tobacco. He reports that he drinks about 2.4 oz of alcohol per week . He reports that he does not use drugs.  No Known Allergies  Medications:  I have reviewed the patient's current medications. Prior to Admission:  Prescriptions Prior to Admission  Medication Sig Dispense Refill Last Dose  . amoxicillin (AMOXIL) 875 MG tablet Take 875 mg by mouth 2 (two) times daily. for 10 days   08/24/2016 at Unknown time  . citalopram (CELEXA) 40 MG tablet Take 40 mg by mouth daily.   08/24/2016 at Unknown time  . fexofenadine-pseudoephedrine (ALLEGRA-D) 60-120 MG 12 hr tablet Take 1 tablet by mouth 2 (two) times daily. 20 tablet 0 08/24/2016 at Unknown time  . fluticasone (FLONASE) 50 MCG/ACT nasal spray Place 2 sprays into both nostrils daily. 16 g 2 08/24/2016 at Unknown time  . traMADol (ULTRAM) 50 MG tablet Take 1 tablet (50 mg total) by mouth every 6 (six) hours as needed. 20 tablet 0 08/24/2016 at Unknown  time   Scheduled: . amoxicillin  500 mg Oral TID  . citalopram  40 mg Oral Daily  . enoxaparin (LOVENOX) injection  40 mg Subcutaneous Q24H  . fluticasone  2 spray Each Nare Daily  . loratadine  10 mg Oral Daily    ROS: History obtained from the patient  General ROS: negative for - chills, fatigue, fever, night sweats, weight gain or weight loss Psychological ROS: negative for - behavioral disorder, hallucinations, memory difficulties, mood swings or suicidal ideation Ophthalmic ROS: negative for - blurry vision, double vision, eye pain or loss of vision ENT ROS: negative for - epistaxis, nasal discharge, oral lesions, sore throat, tinnitus or vertigo Allergy and Immunology ROS: negative for - hives or itchy/watery eyes Hematological and Lymphatic ROS: negative for - bleeding problems, bruising or swollen lymph nodes Endocrine ROS: negative for - galactorrhea, hair pattern changes, polydipsia/polyuria or temperature intolerance Respiratory ROS: negative for - cough, hemoptysis, shortness of breath or wheezing Cardiovascular ROS: negative for - chest pain, dyspnea on exertion, edema or irregular heartbeat Gastrointestinal ROS: as noted in HPI Genito-Urinary ROS: negative for - dysuria, hematuria, incontinence or urinary frequency/urgency Musculoskeletal ROS: negative for - joint swelling or muscular weakness Neurological ROS: as noted in HPI Dermatological ROS: rash on legs  Physical Examination: Blood pressure 93/65, pulse 68, temperature 98.7 F (37.1 C), temperature source Oral, resp. rate 18, height 5'  10" (1.778 m), weight 113.4 kg (250 lb), SpO2 92 %.  HEENT-  Normocephalic, no lesions, without obvious abnormality.  Normal external eye and conjunctiva.  Normal TM's bilaterally.  Normal auditory canals and external ears. Normal external nose, mucus membranes and septum.  Normal pharynx. Cardiovascular- S1, S2 normal, pulses palpable throughout   Lungs- chest clear, no wheezing,  rales, normal symmetric air entry Abdomen- soft, non-tender; bowel sounds normal; no masses,  no organomegaly Extremities- no edema Lymph-no adenopathy palpable Musculoskeletal-no joint tenderness, deformity or swelling Skin-petechial rash on lower extremities  Neurological Examination   Mental Status: Alert, oriented, thought content appropriate.  Speech fluent without evidence of aphasia.  Able to follow 3 step commands without difficulty. Cranial Nerves: II: Discs flat bilaterally; Visual fields grossly normal, pupils equal, round, reactive to light and accommodation III,IV, VI: ptosis not present, extra-ocular motions intact bilaterally V,VII: smile symmetric, facial light touch sensation normal bilaterally VIII: hearing normal bilaterally IX,X: gag reflex present XI: bilateral shoulder shrug XII: midline tongue extension Motor: Right : Upper extremity   5/5    Left:     Upper extremity   5/5  Lower extremity   5/5     Lower extremity   5/5 Tone and bulk:normal tone throughout; no atrophy noted Sensory: Pinprick and light touch intact throughout, bilaterally Deep Tendon Reflexes: 2+ and symmetric throughout Plantars: Right: downgoing   Left: downgoing Cerebellar: normal finger-to-nose, normal rapid alternating movements and normal heel-to-shin test Gait: not tested secondary to severe pain    Laboratory Studies:   Basic Metabolic Panel:  Recent Labs Lab 08/25/16 0448  NA 136  K 4.2  CL 102  CO2 26  GLUCOSE 105*  BUN 21*  CREATININE 1.03  CALCIUM 9.2    Liver Function Tests:  Recent Labs Lab 08/25/16 0448  AST 22  ALT 22  ALKPHOS 65  BILITOT 1.2  PROT 8.1  ALBUMIN 4.6   No results for input(s): LIPASE, AMYLASE in the last 168 hours. No results for input(s): AMMONIA in the last 168 hours.  CBC:  Recent Labs Lab 08/25/16 0448  WBC 12.5*  NEUTROABS 10.5*  HGB 15.5  HCT 46.4  MCV 86.7  PLT 208    Cardiac Enzymes: No results for input(s):  CKTOTAL, CKMB, CKMBINDEX, TROPONINI in the last 168 hours.  BNP: Invalid input(s): POCBNP  CBG: No results for input(s): GLUCAP in the last 168 hours.  Microbiology: Results for orders placed or performed during the hospital encounter of 08/25/16  Gram stain     Status: None   Collection Time: 08/25/16  5:52 AM  Result Value Ref Range Status   Specimen Description CSF  Final   Special Requests Normal  Final   Gram Stain   Final    NO ORGANISMS SEEN MODERATE WBC SEEN RARE NO RBC SEEN    Report Status 08/25/2016 FINAL  Final    Coagulation Studies: No results for input(s): LABPROT, INR in the last 72 hours.  Urinalysis: No results for input(s): COLORURINE, LABSPEC, PHURINE, GLUCOSEU, HGBUR, BILIRUBINUR, KETONESUR, PROTEINUR, UROBILINOGEN, NITRITE, LEUKOCYTESUR in the last 168 hours.  Invalid input(s): APPERANCEUR  Lipid Panel:  No results found for: CHOL, TRIG, HDL, CHOLHDL, VLDL, LDLCALC  HgbA1C: No results found for: HGBA1C  Urine Drug Screen:  No results found for: LABOPIA, COCAINSCRNUR, LABBENZ, AMPHETMU, THCU, LABBARB  Alcohol Level: No results for input(s): ETH in the last 168 hours.   Imaging: Ct Head Wo Contrast  Result Date: 08/24/2016 CLINICAL DATA:  Acute  onset of headache. Being treated for sinus infection. EXAM: CT HEAD WITHOUT CONTRAST TECHNIQUE: Contiguous axial images were obtained from the base of the skull through the vertex without intravenous contrast. COMPARISON:  12/13/2014 FINDINGS: Brain: No evidence of acute infarction, hemorrhage, hydrocephalus, extra-axial collection or mass lesion/mass effect. Vascular: No hyperdense vessel or unexpected calcification. Skull: Normal. Negative for fracture or focal lesion. Sinuses/Orbits: Extensive mucosal thickening in the bilateral ethmoid air cells. Mucosal disease in the bilateral maxillary sinuses, right side greater than left. Minimal disease in the sphenoid sinuses. Other: None. IMPRESSION: No acute  intracranial abnormality. Paranasal sinus disease. Electronically Signed   By: Richarda OverlieAdam  Henn M.D.   On: 08/24/2016 13:38     Assessment/Plan: 41 year old male presenting with intractable headache.  Neurological examination is nonfocal.  Head CT reviewed and shows no acute changes.  LP results show an elevated protein at 125.  Normal glucose and elevated wbc's at 75 (77% lymphs).  Gram stain negative.  Suspect viral meningitis.  Concerned for rash though.  Patient does not admit to tick exposure.    Recommendations: 1.  Lyme titer, RMSF titer 2.  Would consider discontinuation of Rocephin 3.  Patient has received some improvement in pain from Dilaudid but has failed Percocet and Toradol.  Trying Imitrex today.  Will need to find a regimen patient will be able to continue on an outpatient basis.      Thana FarrLeslie Kentley Cedillo, MD Neurology [phone] 08/25/2016, 12:12 PM

## 2016-08-25 NOTE — Plan of Care (Signed)
Problem: Pain Managment: Goal: General experience of comfort will improve Outcome: Progressing Pt admitted from the ED. Headache managed with pain meds. Headache improved during the shift. No c/o n/v.

## 2016-08-25 NOTE — Progress Notes (Signed)
Pharmacy Antibiotic Note  Jon Saunders is a 41 y.o. male admitted on 08/25/2016 with meningitis.  Pharmacy has been consulted for Ceftriaxone dosing. Patient received ceftriaxone 1g IV in ED.  Plan: Will order ceftriaxone 1g IV x 1 dose now (=2g total dose) followed by ceftriaxone 2g IV every 12 hours there after.    Height: 5\' 10"  (177.8 cm) Weight: 250 lb (113.4 kg) IBW/kg (Calculated) : 73  Temp (24hrs), Avg:98.6 F (37 C), Min:98.4 F (36.9 C), Max:98.7 F (37.1 C)   Recent Labs Lab 08/25/16 0448 08/25/16 0552  WBC 12.5*  --   CREATININE 1.03  --   LATICACIDVEN  --  0.9    Estimated Creatinine Clearance: 121.5 mL/min (by C-G formula based on SCr of 1.03 mg/dL).    No Known Allergies  Antimicrobials this admission: 6/17 ceftriaxone >>  6/17 amoxicillin >>  Dose adjustments this admission:  Microbiology results: 6/17 BCx: sent  6/17 CSF: sent    Thank you for allowing pharmacy to be a part of this patient's care.  Gardner CandleSheema M Mikle Sternberg, PharmD, BCPS Clinical Pharmacist 08/25/2016 9:47 AM

## 2016-08-26 DIAGNOSIS — R51 Headache: Secondary | ICD-10-CM

## 2016-08-26 DIAGNOSIS — R519 Headache, unspecified: Secondary | ICD-10-CM | POA: Diagnosis present

## 2016-08-26 LAB — CBC
HCT: 41.4 % (ref 40.0–52.0)
Hemoglobin: 14.1 g/dL (ref 13.0–18.0)
MCH: 30 pg (ref 26.0–34.0)
MCHC: 34.1 g/dL (ref 32.0–36.0)
MCV: 88 fL (ref 80.0–100.0)
Platelets: 165 10*3/uL (ref 150–440)
RBC: 4.7 MIL/uL (ref 4.40–5.90)
RDW: 13.4 % (ref 11.5–14.5)
WBC: 12.9 10*3/uL — ABNORMAL HIGH (ref 3.8–10.6)

## 2016-08-26 LAB — BASIC METABOLIC PANEL
Anion gap: 5 (ref 5–15)
BUN: 24 mg/dL — AB (ref 6–20)
CALCIUM: 8.6 mg/dL — AB (ref 8.9–10.3)
CO2: 26 mmol/L (ref 22–32)
CREATININE: 0.98 mg/dL (ref 0.61–1.24)
Chloride: 107 mmol/L (ref 101–111)
GFR calc non Af Amer: >60 mL/min (ref >60)
GLUCOSE: 96 mg/dL (ref 65–99)
Potassium: 4.2 mmol/L (ref 3.5–5.1)
Sodium: 138 mmol/L (ref 135–145)

## 2016-08-26 LAB — HERPES SIMPLEX VIRUS(HSV) DNA BY PCR
HSV 1 DNA: NEGATIVE
HSV 2 DNA: NEGATIVE

## 2016-08-26 LAB — HIV ANTIBODY (ROUTINE TESTING W REFLEX): HIV Screen 4th Generation wRfx: NONREACTIVE

## 2016-08-26 LAB — PATHOLOGIST SMEAR REVIEW

## 2016-08-26 MED ORDER — SUMATRIPTAN SUCCINATE 50 MG PO TABS: 50.0000 mg | ORAL_TABLET | ORAL | 0 refills | Status: DC | PRN

## 2016-08-26 MED ORDER — HYDROCODONE-ACETAMINOPHEN 5-325 MG PO TABS: 1.0000 | ORAL_TABLET | ORAL | 0 refills | Status: DC | PRN

## 2016-08-26 NOTE — Discharge Summary (Signed)
[facility]   PATIENT NAME: Jon Saunders    MR#:  [id number]  DATE OF BIRTH:  03/19/1975  DATE OF ADMISSION:  08/25/2016 ADMITTING PHYSICIAN: Adrian Saran, MD  DATE OF DISCHARGE: 08/26/2016  PRIMARY CARE PHYSICIAN: System, Pcp Not In    ADMISSION DIAGNOSIS:  Intractable headache, unspecified chronicity pattern, unspecified headache type [R51]  DISCHARGE DIAGNOSIS:  Active Problems:   Intractable headache   SECONDARY DIAGNOSIS:   Past Medical History:  Diagnosis Date  . Allergy   . Anxiety     HOSPITAL COURSE:   41 year old male with history of remote meningitis and acute sinusitis who presents with contract with headache.  1. Intractable headache due to viral meningitis: Patient's headache has improved with oral medications. Patient will need both Imitrex and narcotic at discharge for his headache. At this time HSV viral titers pending. He will have follow-up with neurology in the next 1-2 weeks for this lab. Neurology consultation was obtained and patient was evaluated neurologist. MRI and MR brain were negative for acute pathology  2. Depression: Patient continue Celexa  3. Sinusitis: Patient continue on amoxicillin, nasal saline and supportive measures    DISCHARGE CONDITIONS AND DIET:  Stable for discharge and regular diet  CONSULTS OBTAINED:  Treatment Team:  Thana Farr, MD  DRUG ALLERGIES:  No Known Allergies  DISCHARGE MEDICATIONS:   Current Discharge Medication List    START taking these medications   Details  HYDROcodone-acetaminophen (NORCO/VICODIN) 5-325 MG tablet Take 1-2 tablets by mouth every 4 (four) hours as needed for moderate pain. Qty: 30 tablet, Refills: 0    SUMAtriptan (IMITREX) 50 MG tablet Take 1 tablet (50 mg total) by mouth every 2 (two) hours as needed for migraine or headache. May repeat in 2 hours if headache persists or recurs. Qty: 10 tablet, Refills: 0      CONTINUE these  medications which have NOT CHANGED   Details  amoxicillin (AMOXIL) 875 MG tablet Take 875 mg by mouth 2 (two) times daily. for 10 days    citalopram (CELEXA) 40 MG tablet Take 40 mg by mouth daily.    fexofenadine-pseudoephedrine (ALLEGRA-D) 60-120 MG 12 hr tablet Take 1 tablet by mouth 2 (two) times daily. Qty: 20 tablet, Refills: 0    fluticasone (FLONASE) 50 MCG/ACT nasal spray Place 2 sprays into both nostrils daily. Qty: 16 g, Refills: 2      STOP taking these medications     traMADol (ULTRAM) 50 MG tablet           Today   CHIEF COMPLAINT:   Headache has improved currently 2 out of 10 with medications   VITAL SIGNS:  Blood pressure 101/69, pulse (!) 57, temperature 98 F (36.7 C), temperature source Oral, resp. rate 20, height 5\' 10"  (1.778 m), weight 113.8 kg (250 lb 14.4 oz), SpO2 99 %.   REVIEW OF SYSTEMS:  Review of Systems  Constitutional: Negative.  Negative for chills, fever and malaise/fatigue.  HENT: Negative for ear discharge, ear pain, hearing loss, nosebleeds and sore throat.   Eyes: Negative.  Negative for blurred vision and pain.  Respiratory: Negative.  Negative for cough, hemoptysis, shortness of breath and wheezing.   Cardiovascular: Negative.  Negative for chest pain, palpitations and leg swelling.  Gastrointestinal: Negative.  Negative for abdominal pain, blood in stool, diarrhea, nausea and vomiting.  Genitourinary: Negative.  Negative for dysuria.  Musculoskeletal: Negative.  Negative for back pain.  Skin: Negative.   Neurological: Positive  for headaches (better). Negative for dizziness, tremors, speech change, focal weakness and seizures.  Endo/Heme/Allergies: Negative.  Does not bruise/bleed easily.  Psychiatric/Behavioral: Negative.  Negative for depression, hallucinations and suicidal ideas.     PHYSICAL EXAMINATION:  GENERAL:  41 y.o.-year-old patient lying in the bed with no acute distress.  NECK:  Supple, no jugular venous  distention. No thyroid enlargement, no tenderness.  LUNGS: Normal breath sounds bilaterally, no wheezing, rales,rhonchi  No use of accessory muscles of respiration.  CARDIOVASCULAR: S1, S2 normal. No murmurs, rubs, or gallops.  ABDOMEN: Soft, non-tender, non-distended. Bowel sounds present. No organomegaly or mass.  EXTREMITIES: No pedal edema, cyanosis, or clubbing.  PSYCHIATRIC: The patient is alert and oriented x 3.  SKIN: No obvious rash, lesion, or ulcer.   DATA REVIEW:   CBC  Recent Labs Lab 08/26/16 0425  WBC 12.9*  HGB 14.1  HCT 41.4  PLT 165    Chemistries   Recent Labs Lab 08/25/16 0448 08/26/16 0425  NA 136 138  K 4.2 4.2  CL 102 107  CO2 26 26  GLUCOSE 105* 96  BUN 21* 24*  CREATININE 1.03 0.98  CALCIUM 9.2 8.6*  AST 22  --   ALT 22  --   ALKPHOS 65  --   BILITOT 1.2  --     Cardiac Enzymes No results for input(s): TROPONINI in the last 168 hours.  Microbiology Results  @[email or web address] @  RADIOLOGY:  Ct Head Wo Contrast  Result Date: 08/24/2016 CLINICAL DATA:  Acute onset of headache. Being treated for sinus infection. EXAM: CT HEAD WITHOUT CONTRAST TECHNIQUE: Contiguous axial images were obtained from the base of the skull through the vertex without intravenous contrast. COMPARISON:  12/13/2014 FINDINGS: Brain: No evidence of acute infarction, hemorrhage, hydrocephalus, extra-axial collection or mass lesion/mass effect. Vascular: No hyperdense vessel or unexpected calcification. Skull: Normal. Negative for fracture or focal lesion. Sinuses/Orbits: Extensive mucosal thickening in the bilateral ethmoid air cells. Mucosal disease in the bilateral maxillary sinuses, right side greater than left. Minimal disease in the sphenoid sinuses. Other: None. IMPRESSION: No acute intracranial abnormality. Paranasal sinus disease. Electronically Signed   By: Richarda Overlie M.D.   On: 08/24/2016 13:38   Mr Maxine Glenn Head Wo Contrast  Result Date: 08/25/2016 CLINICAL DATA:  41 y/o  M; recently treated sinus infection. Severe frontal headache with nausea, vomiting, and photophobia. EXAM: MRI HEAD WITHOUT CONTRAST MRA HEAD WITHOUT CONTRAST TECHNIQUE: Multiplanar, multiecho pulse sequences of the brain and surrounding structures were obtained without intravenous contrast. Angiographic images of the head were obtained using MRA technique without contrast. COMPARISON:  08/24/2016 CT head. FINDINGS: MRI HEAD FINDINGS Brain: No acute infarction, hemorrhage, hydrocephalus, extra-axial collection or mass lesion. Vascular: Normal flow voids. Skull and upper cervical spine: Normal marrow signal. Sinuses/Orbits: Diffuse paranasal sinus mucosal thickening with patchy ethmoid opacification. No significant abnormal signal of mastoid air cells. Orbits are unremarkable. Other: None. MRA HEAD FINDINGS Anterior circulation: No large vessel occlusion, aneurysm, or significant stenosis is identified. Posterior circulation: No large vessel occlusion, aneurysm, or significant stenosis is identified. Anatomic variant: Patent anterior and bilateral posterior communicating arteries. IMPRESSION: 1. No acute intracranial abnormality identified. Unremarkable MRI of the brain. 2. Normal MRA of the head. 3. Moderate diffuse paranasal sinus disease greatest in right maxillary and ethmoid sinuses. Electronically Signed   By: Mitzi Hansen M.D.   On: 08/25/2016 14:32   Mr Brain Wo Contrast  Result Date: 08/25/2016 CLINICAL DATA:  41 y/o M; recently treated sinus infection. Severe  frontal headache with nausea, vomiting, and photophobia. EXAM: MRI HEAD WITHOUT CONTRAST MRA HEAD WITHOUT CONTRAST TECHNIQUE: Multiplanar, multiecho pulse sequences of the brain and surrounding structures were obtained without intravenous contrast. Angiographic images of the head were obtained using MRA technique without contrast. COMPARISON:  08/24/2016 CT head. FINDINGS: MRI HEAD FINDINGS Brain: No acute infarction, hemorrhage,  hydrocephalus, extra-axial collection or mass lesion. Vascular: Normal flow voids. Skull and upper cervical spine: Normal marrow signal. Sinuses/Orbits: Diffuse paranasal sinus mucosal thickening with patchy ethmoid opacification. No significant abnormal signal of mastoid air cells. Orbits are unremarkable. Other: None. MRA HEAD FINDINGS Anterior circulation: No large vessel occlusion, aneurysm, or significant stenosis is identified. Posterior circulation: No large vessel occlusion, aneurysm, or significant stenosis is identified. Anatomic variant: Patent anterior and bilateral posterior communicating arteries. IMPRESSION: 1. No acute intracranial abnormality identified. Unremarkable MRI of the brain. 2. Normal MRA of the head. 3. Moderate diffuse paranasal sinus disease greatest in right maxillary and ethmoid sinuses. Electronically Signed   By: Mitzi HansenLance  Furusawa-Stratton M.D.   On: 08/25/2016 14:32      Current Discharge Medication List    START taking these medications   Details  HYDROcodone-acetaminophen (NORCO/VICODIN) 5-325 MG tablet Take 1-2 tablets by mouth every 4 (four) hours as needed for moderate pain. Qty: 30 tablet, Refills: 0    SUMAtriptan (IMITREX) 50 MG tablet Take 1 tablet (50 mg total) by mouth every 2 (two) hours as needed for migraine or headache. May repeat in 2 hours if headache persists or recurs. Qty: 10 tablet, Refills: 0      CONTINUE these medications which have NOT CHANGED   Details  amoxicillin (AMOXIL) 875 MG tablet Take 875 mg by mouth 2 (two) times daily. for 10 days    citalopram (CELEXA) 40 MG tablet Take 40 mg by mouth daily.    fexofenadine-pseudoephedrine (ALLEGRA-D) 60-120 MG 12 hr tablet Take 1 tablet by mouth 2 (two) times daily. Qty: 20 tablet, Refills: 0    fluticasone (FLONASE) 50 MCG/ACT nasal spray Place 2 sprays into both nostrils daily. Qty: 16 g, Refills: 2      STOP taking these medications     traMADol (ULTRAM) 50 MG tablet           }   Management plans discussed with the patient and he is in agreement. Stable for discharge home  Patient should follow up with dr Sherryll Burgershah  CODE STATUS:     Code Status Orders        Start     Ordered   08/25/16 0852  Full code  Continuous     08/25/16 0851    Code Status History    Date Active Date Inactive Code Status Order ID Comments User Context   12/13/2014  8:45 AM 12/14/2014  1:14 PM Full Code [id number]  Enedina FinnerPatel, Sona, MD Inpatient    Advance Directive Documentation     Most Recent Value  Type of Advance Directive  Healthcare Power of Attorney  Pre-existing out of facility DNR order (yellow form or pink MOST form)  -  "MOST" Form in Place?  -      TOTAL TIME TAKING CARE OF THIS PATIENT: 37minutes.    Note: This dictation was prepared with Dragon dictation along with smaller phrase technology. Any transcriptional errors that result from this process are unintentional.  Eleri Ruben M.D on 08/26/2016 at 8:20 AM  Between 7am to 6pm - Pager - [phone] After 6pm go to [email or web address] - password [email or web address]  [facility]  Office  [phone]  CC:dr hemang shah

## 2016-08-26 NOTE — Progress Notes (Signed)
Pt has been discharged home. Discharge papers given and explained to pt. Pt verbalized understanding. Meds and f/u appointment reviewed with pt. RX given. Pt walked to his car, declined a wheelchair.

## 2016-08-27 LAB — B. BURGDORFI ANTIBODIES

## 2016-08-28 LAB — ROCKY MTN SPOTTED FVR ABS PNL(IGG+IGM)
RMSF IGG: POSITIVE — AB
RMSF IGM: 0.47 {index} (ref 0.00–0.89)

## 2016-08-28 LAB — GRAM STAIN
Gram Stain: NONE SEEN
Special Requests: NORMAL

## 2016-08-28 LAB — RMSF, IGG, IFA

## 2016-08-29 LAB — CSF CULTURE
CULTURE: NO GROWTH
GRAM STAIN: NONE SEEN
SPECIAL REQUESTS: NORMAL

## 2016-08-29 LAB — CSF CULTURE W GRAM STAIN

## 2016-08-30 ENCOUNTER — Observation Stay
Admission: EM | Admit: 2016-08-30 | Discharge: 2016-08-31 | Disposition: A | Discharge status: Home / Self Care | Payer: BLUE CROSS/BLUE SHIELD | Attending: Specialist | Admitting: Specialist

## 2016-08-30 DIAGNOSIS — A770 Spotted fever due to Rickettsia rickettsii: Secondary | ICD-10-CM | POA: Diagnosis present

## 2016-08-30 DIAGNOSIS — G43909 Migraine, unspecified, not intractable, without status migrainosus: Secondary | ICD-10-CM | POA: Diagnosis not present

## 2016-08-30 DIAGNOSIS — G039 Meningitis, unspecified: Secondary | ICD-10-CM

## 2016-08-30 DIAGNOSIS — Z7951 Long term (current) use of inhaled steroids: Secondary | ICD-10-CM | POA: Diagnosis not present

## 2016-08-30 DIAGNOSIS — Z8661 Personal history of infections of the central nervous system: Secondary | ICD-10-CM | POA: Diagnosis not present

## 2016-08-30 DIAGNOSIS — Z79899 Other long term (current) drug therapy: Secondary | ICD-10-CM | POA: Insufficient documentation

## 2016-08-30 DIAGNOSIS — Z9889 Other specified postprocedural states: Secondary | ICD-10-CM | POA: Insufficient documentation

## 2016-08-30 DIAGNOSIS — R51 Headache: Secondary | ICD-10-CM | POA: Diagnosis not present

## 2016-08-30 DIAGNOSIS — J329 Chronic sinusitis, unspecified: Secondary | ICD-10-CM | POA: Diagnosis not present

## 2016-08-30 DIAGNOSIS — F419 Anxiety disorder, unspecified: Secondary | ICD-10-CM | POA: Insufficient documentation

## 2016-08-30 DIAGNOSIS — R519 Headache, unspecified: Secondary | ICD-10-CM | POA: Diagnosis present

## 2016-08-30 DIAGNOSIS — F329 Major depressive disorder, single episode, unspecified: Secondary | ICD-10-CM | POA: Diagnosis not present

## 2016-08-30 LAB — BASIC METABOLIC PANEL
Anion gap: 11 (ref 5–15)
BUN: 22 mg/dL — ABNORMAL HIGH (ref 6–20)
CHLORIDE: 101 mmol/L (ref 101–111)
CO2: 22 mmol/L (ref 22–32)
Calcium: 9.7 mg/dL (ref 8.9–10.3)
Creatinine, Ser: 1.23 mg/dL (ref 0.61–1.24)
GFR calc non Af Amer: >60 mL/min (ref >60)
Glucose, Bld: 99 mg/dL (ref 65–99)
POTASSIUM: 4.1 mmol/L (ref 3.5–5.1)
SODIUM: 134 mmol/L — AB (ref 135–145)

## 2016-08-30 LAB — CBC WITH DIFFERENTIAL/PLATELET
Basophils Absolute: 0.1 10*3/uL (ref 0–0.1)
Basophils Relative: 1 %
EOS ABS: 0.2 10*3/uL (ref 0–0.7)
Eosinophils Relative: 1 %
HEMATOCRIT: 51.2 % (ref 40.0–52.0)
HEMOGLOBIN: 17.5 g/dL (ref 13.0–18.0)
LYMPHS ABS: 2.5 10*3/uL (ref 1.0–3.6)
Lymphocytes Relative: 16 %
MCH: 29.2 pg (ref 26.0–34.0)
MCHC: 34.3 g/dL (ref 32.0–36.0)
MCV: 85.2 fL (ref 80.0–100.0)
Monocytes Absolute: 0.9 10*3/uL (ref 0.2–1.0)
Monocytes Relative: 6 %
NEUTROS ABS: 12.1 10*3/uL — AB (ref 1.4–6.5)
Neutrophils Relative %: 76 %
Platelets: 266 10*3/uL (ref 150–440)
RBC: 6.01 MIL/uL — AB (ref 4.40–5.90)
RDW: 13.4 % (ref 11.5–14.5)
WBC: 15.8 10*3/uL — AB (ref 3.8–10.6)

## 2016-08-30 LAB — CULTURE, BLOOD (ROUTINE X 2)
CULTURE: NO GROWTH
Culture: NO GROWTH
Special Requests: ADEQUATE

## 2016-08-30 LAB — CK: CK TOTAL: 39 U/L — AB (ref 49–397)

## 2016-08-30 MED ORDER — DOXYCYCLINE HYCLATE 100 MG IV SOLR
100.0000 mg | Freq: Once | INTRAVENOUS | Status: AC
  Administered 2016-08-31: 100 mg via INTRAVENOUS
  Filled 2016-08-30: qty 100

## 2016-08-30 MED ORDER — KETOROLAC TROMETHAMINE 30 MG/ML IJ SOLN
30.0000 mg | Freq: Once | INTRAMUSCULAR | Status: AC
  Administered 2016-08-30: 30 mg via INTRAVENOUS
  Filled 2016-08-30: qty 1

## 2016-08-30 MED ORDER — METOCLOPRAMIDE HCL 5 MG/ML IJ SOLN
10.0000 mg | Freq: Once | INTRAMUSCULAR | Status: AC
  Administered 2016-08-30: 10 mg via INTRAVENOUS
  Filled 2016-08-30: qty 2

## 2016-08-30 MED ORDER — SODIUM CHLORIDE 0.9 % IV BOLUS (SEPSIS)
1000.0000 mL | Freq: Once | INTRAVENOUS | Status: AC
  Administered 2016-08-30: 1000 mL via INTRAVENOUS

## 2016-08-30 MED ORDER — DIPHENHYDRAMINE HCL 50 MG/ML IJ SOLN
25.0000 mg | Freq: Once | INTRAMUSCULAR | Status: AC
  Administered 2016-08-30: 25 mg via INTRAVENOUS
  Filled 2016-08-30: qty 1

## 2016-08-30 MED ORDER — ONDANSETRON 4 MG PO TBDP
4.0000 mg | ORAL_TABLET | Freq: Once | ORAL | Status: AC
  Administered 2016-08-30: 4 mg via ORAL
  Filled 2016-08-30: qty 1

## 2016-08-30 NOTE — ED Triage Notes (Signed)
Patient states that he was diagnosed with meningitis last week and was discharged on Monday. Patient states that his headache became worse again yesterday.

## 2016-08-30 NOTE — ED Provider Notes (Signed)
[facility] Emergency Department Provider Note   ____________________________________________   First MD Initiated Contact with Patient 08/30/16 2309     (approximate)  I have reviewed the triage vital signs and the nursing notes.   HISTORY  Chief Complaint Headache    HPI Jon Saunders is a 41 y.o. male who comes into the hospital today with a headache. The patient was here Saturday Sunday and Monday after having a bad headache. The patient was diagnosed with viral meningitis. He also had some sinus infection and has been taking amoxicillin. The patient reports though that after he was discharged he is continued to have headaches. Nothing seems to touch the pain. The patient has had some nausea and vomiting with dizziness. He also has pain to the back of his neck. He reports that the pain is extending everywhere. He's had a low-grade temperature to 99 but he's been having chills that of come and gone. The patient states that he had meningitis a couple of years ago but they could not do the lumbar puncture. He reports that he was tested for everything and it was all negative. The patient rates his pain a 10 out of 10 in intensity currently. The patient has been using Imitrex and hydrocodone. He is here today for evaluation of his symptoms.   Past Medical History:  Diagnosis Date  . Allergy   . Anxiety     Patient Active Problem List   Diagnosis Date Noted  . Headache 08/26/2016  . Intractable headache 08/25/2016  . Fever 01/05/2015  . Thrombocytopenia (HCC) 01/05/2015  . Elevated liver enzymes 01/05/2015  . Monocytosis 01/05/2015  . Rash 01/05/2015  . Sinusitis 12/13/2014    Past Surgical History:  Procedure Laterality Date  . WISDOM TOOTH EXTRACTION      Prior to Admission medications   Medication Sig Start Date End Date Taking? Authorizing Provider  amoxicillin (AMOXIL) 875 MG tablet Take 875 mg by mouth 2 (two) times daily. for 10 days   Yes  [provider]  citalopram (CELEXA) 40 MG tablet Take 40 mg by mouth daily.   Yes [provider]  fexofenadine-pseudoephedrine (ALLEGRA-D) 60-120 MG 12 hr tablet Take 1 tablet by mouth 2 (two) times daily. 08/24/16  Yes Joni ReiningSmith, Ronald K, PA-C  fluticasone (FLONASE) 50 MCG/ACT nasal spray Place 2 sprays into both nostrils daily. 12/14/14  Yes Enedina FinnerPatel, Sona, MD  HYDROcodone-acetaminophen (NORCO/VICODIN) 5-325 MG tablet Take 1-2 tablets by mouth every 4 (four) hours as needed for moderate pain. 08/26/16  Yes Mody, Patricia PesaSital, MD  SUMAtriptan (IMITREX) 50 MG tablet Take 1 tablet (50 mg total) by mouth every 2 (two) hours as needed for migraine or headache. May repeat in 2 hours if headache persists or recurs. 08/26/16  Yes Adrian SaranMody, Sital, MD    Allergies Patient has no known allergies.  Family History  Problem Relation Age of Onset  . Hypertension Mother     Social History Social History  Substance Use Topics  . Smoking status: Never Smoker  . Smokeless tobacco: Never Used  . Alcohol use 2.4 oz/week    3 Standard drinks or equivalent, 1 Glasses of wine per week     Comment: every couple days     Review of Systems  Constitutional: chills Eyes: No visual changes. ENT: No sore throat. Cardiovascular: Denies chest pain. Respiratory: Denies shortness of breath. Gastrointestinal: Nausea and vomiting with No abdominal pain.  No diarrhea.  No constipation. Genitourinary: Negative for dysuria. Musculoskeletal: Neck pain  Skin: Negative for rash. Neurological: Headache   ____________________________________________   PHYSICAL EXAM:  VITAL SIGNS: ED Triage Vitals  Enc Vitals Group     BP 08/30/16 2106 125/78     Pulse Rate 08/30/16 2106 (!) 115     Resp 08/30/16 2106 18     Temp 08/30/16 2106 98.7 F (37.1 C)     Temp Source 08/30/16 2106 Oral     SpO2 08/30/16 2106 97 %     Weight 08/30/16 2028 250 lb (113.4 kg)     Height 08/30/16 2028 5\' 10"  (1.778 m)     Head  Circumference --      Peak Flow --      Pain Score 08/30/16 2028 9     Pain Loc --      Pain Edu? --      Excl. in GC? --     Constitutional: Alert and oriented. Well appearing and in Significant distress. Eyes: Conjunctivae are normal. PERRL. EOMI. Head: Atraumatic. Nose: No congestion/rhinnorhea. Mouth/Throat: Mucous membranes are moist.  Oropharynx non-erythematous. Neck: Tenderness to palpation of cervical spine Cardiovascular: Normal rate, regular rhythm. Grossly normal heart sounds.  Good peripheral circulation. Respiratory: Normal respiratory effort.  No retractions. Lungs CTAB. Gastrointestinal: Soft and nontender. No distention. Positive bowel sounds Musculoskeletal: No lower extremity tenderness nor edema.   Neurologic:  Normal speech and language. Cranial nerves II through XII are grossly intact with no focal motor or neuro deficits Skin:  Skin is warm, dry and intact.  Psychiatric: Mood and affect are normal.   ____________________________________________   LABS (all labs ordered are listed, but only abnormal results are displayed)  Labs Reviewed  CBC WITH DIFFERENTIAL/PLATELET - Abnormal; Notable for the following:       Result Value   WBC 15.8 (*)    RBC 6.01 (*)    Neutro Abs 12.1 (*)    All other components within normal limits  BASIC METABOLIC PANEL - Abnormal; Notable for the following:    Sodium 134 (*)    BUN 22 (*)    All other components within normal limits  CK - Abnormal; Notable for the following:    Total CK 39 (*)    All other components within normal limits   ____________________________________________  EKG  none ____________________________________________  RADIOLOGY  No results found.  ____________________________________________   PROCEDURES  Procedure(s) performed: None  Procedures  Critical Care performed: No  ____________________________________________   INITIAL IMPRESSION / ASSESSMENT AND PLAN / ED  COURSE  Pertinent labs & imaging results that were available during my care of the patient were reviewed by me and considered in my medical decision making (see chart for details).  This is a 41 year old male who comes into the hospital today with headache. The patient was diagnosed with meningitis approximately 1 week ago. He was sent home but reports that he is unable to manage the pain at home. Looking back at the patient's results it appears that he has a positive RMSF IgG EIA and IFA. Those results are concerning for possible RMSF infection. The patient's IgM was negative. I will give the patient some Reglan, Benadryl, Toradol, a liter of normal saline as well as some doxycycline. I will then reassess the patient. I will consider readmitting the patient to the hospitalist service.      The patient also received morphine for his headache. He still having some pains I will admit him to the hospitalist service for further treatment of his meningitis. ____________________________________________  FINAL CLINICAL IMPRESSION(S) / ED DIAGNOSES  Final diagnoses:  Meningitis  RMSF [facility] spotted fever)      NEW MEDICATIONS STARTED DURING THIS VISIT:  New Prescriptions   No medications on file     Note:  This document was prepared using Dragon voice recognition software and may include unintentional dictation errors.    Rebecka Apley, MD 08/31/16 [phone]

## 2016-08-31 ENCOUNTER — Encounter: Payer: Self-pay | Admitting: Internal Medicine

## 2016-08-31 LAB — BASIC METABOLIC PANEL
ANION GAP: 5 (ref 5–15)
BUN: 25 mg/dL — ABNORMAL HIGH (ref 6–20)
CALCIUM: 8.7 mg/dL — AB (ref 8.9–10.3)
CO2: 29 mmol/L (ref 22–32)
Chloride: 101 mmol/L (ref 101–111)
Creatinine, Ser: 1.24 mg/dL (ref 0.61–1.24)
GLUCOSE: 83 mg/dL (ref 65–99)
Potassium: 4.3 mmol/L (ref 3.5–5.1)
SODIUM: 135 mmol/L (ref 135–145)

## 2016-08-31 LAB — CBC
HCT: 45.7 % (ref 40.0–52.0)
HEMOGLOBIN: 15.3 g/dL (ref 13.0–18.0)
MCH: 29.7 pg (ref 26.0–34.0)
MCHC: 33.5 g/dL (ref 32.0–36.0)
MCV: 88.7 fL (ref 80.0–100.0)
Platelets: 189 10*3/uL (ref 150–440)
RBC: 5.16 MIL/uL (ref 4.40–5.90)
RDW: 13.5 % (ref 11.5–14.5)
WBC: 11.9 10*3/uL — AB (ref 3.8–10.6)

## 2016-08-31 MED ORDER — SODIUM CHLORIDE 0.9% FLUSH: 3.0000 mL | INTRAVENOUS | Status: DC | PRN

## 2016-08-31 MED ORDER — MORPHINE SULFATE (PF) 4 MG/ML IV SOLN
4.0000 mg | Freq: Once | INTRAVENOUS | Status: AC
  Administered 2016-08-31: 4 mg via INTRAVENOUS
  Filled 2016-08-31: qty 1

## 2016-08-31 MED ORDER — FLUTICASONE PROPIONATE 50 MCG/ACT NA SUSP
2.0000 | Freq: Every day | NASAL | Status: DC
  Administered 2016-08-31: 08:00:00 2 via NASAL
  Filled 2016-08-31: qty 16

## 2016-08-31 MED ORDER — ONDANSETRON HCL 4 MG/2ML IJ SOLN: 4.0000 mg | Freq: Four times a day (QID) | INTRAMUSCULAR | Status: DC | PRN

## 2016-08-31 MED ORDER — MORPHINE SULFATE (PF) 2 MG/ML IV SOLN: 2.0000 mg | INTRAVENOUS | Status: DC | PRN

## 2016-08-31 MED ORDER — OXYCODONE-ACETAMINOPHEN 5-325 MG PO TABS: 1.0000 | ORAL_TABLET | ORAL | Status: DC | PRN

## 2016-08-31 MED ORDER — SUMATRIPTAN SUCCINATE 50 MG PO TABS
50.0000 mg | ORAL_TABLET | ORAL | Status: DC | PRN
  Filled 2016-08-31: qty 1

## 2016-08-31 MED ORDER — ENOXAPARIN SODIUM 40 MG/0.4ML ~~LOC~~ SOLN
40.0000 mg | SUBCUTANEOUS | Status: DC
  Administered 2016-08-31: 07:00:00 40 mg via SUBCUTANEOUS
  Filled 2016-08-31: qty 0.4

## 2016-08-31 MED ORDER — ONDANSETRON HCL 4 MG PO TABS: 4.0000 mg | ORAL_TABLET | Freq: Four times a day (QID) | ORAL | Status: DC | PRN

## 2016-08-31 MED ORDER — MORPHINE SULFATE (PF) 2 MG/ML IV SOLN
2.0000 mg | Freq: Four times a day (QID) | INTRAVENOUS | Status: DC | PRN
Start: 2016-08-31 — End: 2016-08-31

## 2016-08-31 MED ORDER — BUTALBITAL-APAP-CAFFEINE 50-325-40 MG PO TABS
2.0000 | ORAL_TABLET | ORAL | Status: DC | PRN
  Administered 2016-08-31: 2 via ORAL
  Filled 2016-08-31: qty 2

## 2016-08-31 MED ORDER — SODIUM CHLORIDE 0.9 % IV SOLN
INTRAVENOUS | Status: DC
  Administered 2016-08-31: 04:00:00 via INTRAVENOUS

## 2016-08-31 MED ORDER — DOXYCYCLINE HYCLATE 100 MG PO TBEC: 100.0000 mg | DELAYED_RELEASE_TABLET | Freq: Two times a day (BID) | ORAL | 0 refills | Status: AC

## 2016-08-31 MED ORDER — SENNOSIDES-DOCUSATE SODIUM 8.6-50 MG PO TABS: 1.0000 | ORAL_TABLET | Freq: Every evening | ORAL | Status: DC | PRN

## 2016-08-31 MED ORDER — BUTALBITAL-APAP-CAFFEINE 50-325-40 MG PO TABS: 2.0000 | ORAL_TABLET | ORAL | 0 refills | Status: DC | PRN

## 2016-08-31 MED ORDER — DOXYCYCLINE HYCLATE 100 MG IV SOLR
100.0000 mg | Freq: Two times a day (BID) | INTRAVENOUS | Status: DC
  Administered 2016-08-31: 100 mg via INTRAVENOUS
  Filled 2016-08-31 (×2): qty 100

## 2016-08-31 MED ORDER — ACETAMINOPHEN 325 MG PO TABS: 650.0000 mg | ORAL_TABLET | Freq: Four times a day (QID) | ORAL | Status: DC | PRN

## 2016-08-31 MED ORDER — ACETAMINOPHEN 650 MG RE SUPP: 650.0000 mg | Freq: Four times a day (QID) | RECTAL | Status: DC | PRN

## 2016-08-31 MED ORDER — HYDROCODONE-ACETAMINOPHEN 5-325 MG PO TABS: 1.0000 | ORAL_TABLET | ORAL | Status: DC | PRN

## 2016-08-31 MED ORDER — CITALOPRAM HYDROBROMIDE 20 MG PO TABS
40.0000 mg | ORAL_TABLET | Freq: Every day | ORAL | Status: DC
  Administered 2016-08-31: 40 mg via ORAL
  Filled 2016-08-31: qty 2

## 2016-08-31 MED ORDER — SODIUM CHLORIDE 0.9% FLUSH
3.0000 mL | Freq: Two times a day (BID) | INTRAVENOUS | Status: DC
  Administered 2016-08-31: 3 mL via INTRAVENOUS

## 2016-08-31 MED ORDER — KETOROLAC TROMETHAMINE 30 MG/ML IJ SOLN
30.0000 mg | Freq: Four times a day (QID) | INTRAMUSCULAR | Status: DC | PRN
  Administered 2016-08-31: 06:00:00 30 mg via INTRAVENOUS
  Filled 2016-08-31: qty 1

## 2016-08-31 NOTE — Plan of Care (Addendum)
Problem: Education: Goal: Knowledge of [facility] Education information/materials will improve Outcome: Progressing Oriented to unit, including call bell, dietary.  VS WDL, free of falls.  Reported HA pain 9/10, improved to 6/10 w/ PRN IV Toradol 30mg  x1 when lying still.  Per pt request, Dr. Tobi BastosPyreddy paged for IV Morphine if needed.  IV Morphine 2mg  Q4H PRN ordered, pt aware.  No other needs.  Wife at bedside.  Bed in low position, call bell within reach.  WCTM.

## 2016-08-31 NOTE — Progress Notes (Signed)
Ready for discharge home. Transported pt in transport chair to private vehicle. Discharge home to self/wife's care.

## 2016-08-31 NOTE — Discharge Instructions (Signed)
[place] Spotted Fever [place] spotted fever is an illness that is spread to people by infected ticks. The illness causes flulike symptoms and a reddish-purple rash. This illness can quickly become very serious. Treatment must be started right away. When the illness is not treated right away, it can sometimes lead to long-term health problems or even death. This illness is most common during warm weather when ticks are most active. What are the causes? [place] spotted fever is caused by a type of bacteria that is called Rickettsia rickettsii. This type of bacteria is carried by American dog ticks and [place] wood ticks. People get infected through a bite from a tick that is infected with the bacteria. The bite is painless, and it frequently goes unnoticed. The bacteria can also infect a person when tick blood or tick feces get into a person's body through damaged skin. A tick bite is not necessary for an infection to occur. People can get [place] spotted fever if they get a tick's blood or body fluids on their skin in the area of a small cut or sore. This could happen while removing a tick from another person or a dog. The infection is not contagious, and it cannot be spread (transmitted) from person to person. What are the signs or symptoms? Symptoms may begin 2-14 days after a tick bite. The most common early symptoms are:  Fever.  Muscle aches.  Headache.  Nausea.  Vomiting.  Poor appetite.  Abdominal pain.  The reddish-purple rash usually appears 3-5 days after the first symptoms begin. The rash often starts on the wrists and ankles. It may then spread to the palms, the soles of the feet, the legs, and the trunk. How is this diagnosed? Diagnosis is based on a physical exam, medical history, and blood tests. Your health care provider may suspect [place] spotted fever in one of these cases:  If you have recently been bitten by a tick.  If you  have been in areas that have a lot of ticks or in areas where the disease is common.  How is this treated? It is important to begin treatment right away. Treatment will usually involve the use of antibiotic medicines. In some cases, your health care provider may begin treatment before the diagnosis is confirmed. If your symptoms are severe, a hospital stay may be needed. Follow these instructions at home:  Rest as much as possible until you feel better.  Take medicines only as directed by your health care provider.  Take your antibiotic medicine as directed by your health care provider. Finish the antibiotic even if you start to feel better.  Drink enough fluid to keep your urine clear or pale yellow.  Keep all follow-up visits as directed by your health care provider. This is important. How is this prevented? Avoiding tick bites can help to prevent this illness. Take these steps to avoid tick bites when you are outdoors:  Be aware that most ticks live in shrubs, low tree branches, and grassy areas. A tick can climb onto your body when you make contact with leaves or grass where the tick is waiting.  Wear protective clothing. Long sleeves and long pants are best.  Wear white clothes so you can see ticks more easily.  Tuck your pant legs into your socks.  If you go walking on a trail, stay in the middle of the trail to avoid brushing against bushes.  Avoid walking through areas that have long   grass.  Put insect repellent on all exposed skin and along boot tops, pant legs, and sleeve cuffs.  Check clothing, hair, and skin repeatedly and before going inside.  Check family members and pets for ticks.  Brush off any ticks that are not attached.  Take a shower or a bath as soon as possible after you have been outdoors. Check your skin for ticks. The most common places on the body where ticks attach themselves are the scalp, neck, armpits, waist, and groin.  You can also greatly  reduce your chances of getting [place] spotted fever if you remove attached ticks as soon as possible. To remove an attached tick, use a forceps or fine-point tweezers to detach the intact tick without leaving its mouth parts in the skin. The wound from the tick bite should be washed after the tick has been removed. Contact a health care provider if:  You have drainage, swelling, or increased redness or pain in the area of the rash. Get help right away if:  You have chest pain.  You have shortness of breath.  You have a severe headache.  You have a seizure.  You have severe abdominal pain.  You are feeling confused.  You are bruising easily.  You have bleeding from your gums.  You have blood in your stool. This information is not intended to replace advice given to you by your health care provider. Make sure you discuss any questions you have with your health care provider. Document Released: 06/09/2000 Document Revised: 08/03/2015 Document Reviewed: 10/11/2013 Elsevier Interactive Patient Education  2018 [employer]  

## 2016-08-31 NOTE — Progress Notes (Signed)
Pt reports fiorcet effective in reducing HA to tolerable level. Reports HA worse if lies down with head down. Reports decreased HA if lies on side and particularly if he sits up. Pt and wife agreeable with discharge status. IV doxcycline dose infused.Oral and written AVS instructions completed with prescriptions for doxycycline and fiorcet given. Also gave flonase nasal spray with label directions for home use.

## 2016-08-31 NOTE — Discharge Summary (Signed)
[facility] [place] [facility]   PATIENT NAME: Jon Saunders    MR#:  [id number]  DATE OF BIRTH:  November 26, 1975  DATE OF ADMISSION:  08/30/2016 ADMITTING PHYSICIAN: Ihor AustinPavan Pyreddy, MD  DATE OF DISCHARGE: 08/31/2016  PRIMARY CARE PHYSICIAN: System, Pcp Not In    ADMISSION DIAGNOSIS:  Meningitis [G03.9] RMSF [facility]([place] spotted fever) [A77.0]  DISCHARGE DIAGNOSIS:  Active Problems:   Headache   SECONDARY DIAGNOSIS:   Past Medical History:  Diagnosis Date  . Allergy   . Anxiety     HOSPITAL COURSE:   41 year old male with past medical history of Anxiety/depression, history of migraines who presents to the hospital due to a headache.  1. Headache-patient was recently discharge from the hospital after treatment for viral meningitis. He returns back due to his headache getting worse. [facility]. He was admitted and started on IV doxycycline. -Patient on his previous hospitalization had a CT scan of his head and MRI of his brain which were essentially normal. -Patient was admitted to the hospital and treated for his headache with some Toradol, morphine, Vicodin. I have started him on some Fioricet which seems to have helped his headache. At this point patient is being discharged on oral Fioricet and doxycycline will follow up with his primary care physician.  2. Anxiety/Depression - cont. Celexa.   DISCHARGE CONDITIONS:   Full code  CONSULTS OBTAINED:    DRUG ALLERGIES:  No Known Allergies  DISCHARGE MEDICATIONS:   Allergies as of 08/31/2016   No Known Allergies     Medication List    STOP taking these medications   amoxicillin 875 MG tablet Commonly known as:  AMOXIL     TAKE these medications   butalbital-acetaminophen-caffeine 50-325-40 MG tablet Commonly known as:  FIORICET, ESGIC Take 2 tablets by mouth every 4 (four) hours as needed for headache.   citalopram 40 MG tablet Commonly known as:   CELEXA Take 40 mg by mouth daily.   doxycycline 100 MG EC tablet Commonly known as:  DORYX Take 1 tablet (100 mg total) by mouth 2 (two) times daily.   fexofenadine-pseudoephedrine 60-120 MG 12 hr tablet Commonly known as:  ALLEGRA-D Take 1 tablet by mouth 2 (two) times daily.   fluticasone 50 MCG/ACT nasal spray Commonly known as:  FLONASE Place 2 sprays into both nostrils daily.   HYDROcodone-acetaminophen 5-325 MG tablet Commonly known as:  NORCO/VICODIN Take 1-2 tablets by mouth every 4 (four) hours as needed for moderate pain.   SUMAtriptan 50 MG tablet Commonly known as:  IMITREX Take 1 tablet (50 mg total) by mouth every 2 (two) hours as needed for migraine or headache. May repeat in 2 hours if headache persists or recurs.         DISCHARGE INSTRUCTIONS:   DIET:  Regular diet  DISCHARGE CONDITION:  Stable  ACTIVITY:  Activity as tolerated  OXYGEN:  Home Oxygen: No.   Oxygen Delivery: room air  DISCHARGE LOCATION:  home   If you experience worsening of your admission symptoms, develop shortness of breath, life threatening emergency, suicidal or homicidal thoughts you must seek medical attention immediately by calling 911 or calling your MD immediately  if symptoms less severe.  You Must read complete instructions/literature along with all the possible adverse reactions/side effects for all the Medicines you take and that have been prescribed to you. Take any new Medicines after you have completely understood and accpet all the possible adverse reactions/side  effects.   Please note  You were cared for by a hospitalist during your hospital stay. If you have any questions about your discharge medications or the care you received while you were in the hospital after you are discharged, you can call the unit and asked to speak with the hospitalist on call if the hospitalist that took care of you is not available. Once you are discharged, your primary care  physician will handle any further medical issues. Please note that NO REFILLS for any discharge medications will be authorized once you are discharged, as it is imperative that you return to your primary care physician (or establish a relationship with a primary care physician if you do not have one) for your aftercare needs so that they can reassess your need for medications and monitor your lab values.     Today   Headache has improved.  No N/V/Fever/Chills. Will d/c home today.   VITAL SIGNS:  Blood pressure 114/72, pulse 75, temperature 98.2 F (36.8 C), temperature source Oral, resp. rate 16, height 5\' 10"  (1.778 m), weight 108.8 kg (239 lb 14.4 oz), SpO2 98 %.  I/O:   Intake/Output Summary (Last 24 hours) at 08/31/16 1250 Last data filed at 08/31/16 1015  Gross per 24 hour  Intake             1574 ml  Output                0 ml  Net             1574 ml    PHYSICAL EXAMINATION:  GENERAL:  41 y.o.-year-old patient lying in the bed with no acute distress.  EYES: Pupils equal, round, reactive to light and accommodation. No scleral icterus. Extraocular muscles intact.  HEENT: Head atraumatic, normocephalic. Oropharynx and nasopharynx clear.  NECK:  Supple, no jugular venous distention. No thyroid enlargement, no tenderness.  LUNGS: Normal breath sounds bilaterally, no wheezing, rales,rhonchi. No use of accessory muscles of respiration.  CARDIOVASCULAR: S1, S2 normal. No murmurs, rubs, or gallops.  ABDOMEN: Soft, non-tender, non-distended. Bowel sounds present. No organomegaly or mass.  EXTREMITIES: No pedal edema, cyanosis, or clubbing.  NEUROLOGIC: Cranial nerves II through XII are intact. No focal motor or sensory defecits b/l.  PSYCHIATRIC: The patient is alert and oriented x 3. Good affect.  SKIN: No obvious rash, lesion, or ulcer.    DATA REVIEW:   CBC  Recent Labs Lab 08/31/16 0431  WBC 11.9*  HGB 15.3  HCT 45.7  PLT 189    Chemistries   Recent Labs Lab  08/25/16 0448  08/31/16 0431  NA 136  < > 135  K 4.2  < > 4.3  CL 102  < > 101  CO2 26  < > 29  GLUCOSE 105*  < > 83  BUN 21*  < > 25*  CREATININE 1.03  < > 1.24  CALCIUM 9.2  < > 8.7*  AST 22  --   --   ALT 22  --   --   ALKPHOS 65  --   --   BILITOT 1.2  --   --   < > = values in this interval not displayed.  Cardiac Enzymes No results for input(s): TROPONINI in the last 168 hours.  Microbiology Results  Results for orders placed or performed during the hospital encounter of 08/25/16  CSF culture     Status: None   Collection Time: 08/25/16  5:52 AM  Result Value  Ref Range Status   Specimen Description CSF  Final   Special Requests Normal  Final   Gram Stain NO ORGANISMS SEEN MODERATE WBC SEEN   Final   Culture   Final    NO GROWTH 3 DAYS Performed at [facility], [address], [place], [place] [address]    Report Status 08/29/2016 FINAL  Final  Gram stain     Status: None   Collection Time: 08/25/16  5:52 AM  Result Value Ref Range Status   Specimen Description CSF  Final   Special Requests Normal  Final   Gram Stain   Final    NO ORGANISMS SEEN MODERATE WBC SEEN RARE RED BLOOD CELLS CORRECTED ON 06/20 AT 1309: PREVIOUSLY REPORTED AS RARE NO RBC SEEN    Report Status 08/28/2016 FINAL  Final  Culture, blood (routine x 2)     Status: None   Collection Time: 08/25/16  5:52 AM  Result Value Ref Range Status   Specimen Description BLOOD RIGHT ANTECUBITAL  Final   Special Requests   Final    BOTTLES DRAWN AEROBIC AND ANAEROBIC Blood Culture results may not be optimal due to an excessive volume of blood received in culture bottles   Culture NO GROWTH 5 DAYS  Final   Report Status 08/30/2016 FINAL  Final  Culture, blood (routine x 2)     Status: None   Collection Time: 08/25/16  5:52 AM  Result Value Ref Range Status   Specimen Description BLOOD LEFT HAND  Final   Special Requests   Final    BOTTLES DRAWN AEROBIC AND ANAEROBIC Blood Culture adequate  volume   Culture NO GROWTH 5 DAYS  Final   Report Status 08/30/2016 FINAL  Final    RADIOLOGY:  No results found.    Management plans discussed with the patient, family and they are in agreement.  CODE STATUS:     Code Status Orders        Start     Ordered   08/31/16 0351  Full code  Continuous     08/31/16 0350    Code Status History    Date Active Date Inactive Code Status Order ID Comments User Context   08/25/2016  8:51 AM 08/26/2016  2:04 PM Full Code [id number]  Adrian Saran, MD Inpatient   12/13/2014  8:45 AM 12/14/2014  1:14 PM Full Code [id number]  Enedina Finner, MD Inpatient    Advance Directive Documentation     Most Recent Value  Type of Advance Directive  Healthcare Power of Attorney  Pre-existing out of facility DNR order (yellow form or pink MOST form)  -  "MOST" Form in Place?  -      TOTAL TIME TAKING CARE OF THIS PATIENT: 40 minutes.    Houston Siren M.D on 08/31/2016 at 12:50 PM  Between 7am to 6pm - Pager - [phone]  After 6pm go to [email or web address] - Social research officer, government  [facility] [place] [facility]  Office  [phone]  CC: Primary care physician; System, Pcp Not In

## 2016-08-31 NOTE — ED Notes (Signed)
Water provided to patient, per MD ok for patient to have PO fluids.

## 2016-08-31 NOTE — H&P (Signed)
[facility] [place] at [facility]   PATIENT NAME: Jon Saunders    MR#:  [id number]  DATE OF BIRTH:  12-06-1975  DATE OF ADMISSION:  08/30/2016  PRIMARY CARE PHYSICIAN: System, Pcp Not In   REQUESTING/REFERRING PHYSICIAN:   CHIEF COMPLAINT:   Chief Complaint  Patient presents with  . Headache    HISTORY OF PRESENT ILLNESS: Jon Saunders  is a 41 y.o. male with a known history of Anxiety disorder was recently discharged from our hospital on 08/26/2016 after being treated for headache and viral meningitis. After patient went only still continued to have headache which worsened. Says he also had some low-grade fever. Does not remember whether he never had any tick bite. Patient also has some arthralgias in the ankles and wrist. Patient presented to the emergency room his CSF results were reviewed from the last admission which had some positive titers for RMSF IgG EIA. Agent was given IV doxycycline in the emergency room and hospitalist service was consulted.  PAST MEDICAL HISTORY:   Past Medical History:  Diagnosis Date  . Allergy   . Anxiety     PAST SURGICAL HISTORY: Past Surgical History:  Procedure Laterality Date  . WISDOM TOOTH EXTRACTION      SOCIAL HISTORY:  Social History  Substance Use Topics  . Smoking status: Never Smoker  . Smokeless tobacco: Never Used  . Alcohol use 2.4 oz/week    3 Standard drinks or equivalent, 1 Glasses of wine per week     Comment: every couple days     FAMILY HISTORY:  Family History  Problem Relation Age of Onset  . Hypertension Mother   . Epilepsy Mother   . Migraines Father   . Epilepsy Sister     DRUG ALLERGIES: No Known Allergies  REVIEW OF SYSTEMS:   CONSTITUTIONAL: Low grade fever fever, has weakness.  EYES: No blurred or double vision.  EARS, NOSE, AND THROAT: No tinnitus or ear pain.  RESPIRATORY: No cough, shortness of breath, wheezing or hemoptysis.  CARDIOVASCULAR: No chest pain, orthopnea,  edema.  GASTROINTESTINAL: No nausea, vomiting, diarrhea or abdominal pain.  GENITOURINARY: No dysuria, hematuria.  ENDOCRINE: No polyuria, nocturia,  HEMATOLOGY: No anemia, easy bruising or bleeding SKIN: No rash or lesion. MUSCULOSKELETAL: Has arthralgias NEUROLOGIC: No tingling, numbness, weakness. Has headache PSYCHIATRY: No anxiety or depression.   MEDICATIONS AT HOME:  Prior to Admission medications   Medication Sig Start Date End Date Taking? Authorizing Provider  amoxicillin (AMOXIL) 875 MG tablet Take 875 mg by mouth 2 (two) times daily. for 10 days   Yes [provider]  citalopram (CELEXA) 40 MG tablet Take 40 mg by mouth daily.   Yes [provider]  fexofenadine-pseudoephedrine (ALLEGRA-D) 60-120 MG 12 hr tablet Take 1 tablet by mouth 2 (two) times daily. 08/24/16  Yes Joni Reining, PA-C  fluticasone (FLONASE) 50 MCG/ACT nasal spray Place 2 sprays into both nostrils daily. 12/14/14  Yes Enedina Finner, MD  HYDROcodone-acetaminophen (NORCO/VICODIN) 5-325 MG tablet Take 1-2 tablets by mouth every 4 (four) hours as needed for moderate pain. 08/26/16  Yes Mody, Patricia Pesa, MD  SUMAtriptan (IMITREX) 50 MG tablet Take 1 tablet (50 mg total) by mouth every 2 (two) hours as needed for migraine or headache. May repeat in 2 hours if headache persists or recurs. 08/26/16  Yes Adrian Saran, MD      PHYSICAL EXAMINATION:   VITAL SIGNS: Blood pressure 107/82, pulse 78, temperature 98.7 F (37.1 C), temperature source  Oral, resp. rate 16, height 5\' 10"  (1.778 m), weight 113.4 kg (250 lb), SpO2 95 %.  GENERAL:  41 y.o.-year-old patient lying in the bed with no acute distress.  EYES: Pupils equal, round, reactive to light and accommodation. No scleral icterus. Extraocular muscles intact.  HEENT: Head atraumatic, normocephalic. Oropharynx and nasopharynx clear.  NECK:  Supple, no jugular venous distention. No thyroid enlargement, no tenderness.  LUNGS: Normal breath sounds  bilaterally, no wheezing, rales,rhonchi or crepitation. No use of accessory muscles of respiration.  CARDIOVASCULAR: S1, S2 normal. No murmurs, rubs, or gallops.  ABDOMEN: Soft, nontender, nondistended. Bowel sounds present. No organomegaly or mass.  EXTREMITIES: No pedal edema, cyanosis, or clubbing.  NEUROLOGIC: Cranial nerves II through XII are intact. Muscle strength 5/5 in all extremities. Sensation intact. Gait not checked. Has headache PSYCHIATRIC: The patient is alert and oriented x 3.  SKIN: No obvious rash, lesion, or ulcer.   LABORATORY PANEL:   CBC  Recent Labs Lab 08/25/16 0448 08/26/16 0425 08/30/16 2106  WBC 12.5* 12.9* 15.8*  HGB 15.5 14.1 17.5  HCT 46.4 41.4 51.2  PLT 208 165 266  MCV 86.7 88.0 85.2  MCH 29.0 30.0 29.2  MCHC 33.4 34.1 34.3  RDW 13.3 13.4 13.4  LYMPHSABS 1.4  --  2.5  MONOABS 0.5  --  0.9  EOSABS 0.1  --  0.2  BASOSABS 0.1  --  0.1   ------------------------------------------------------------------------------------------------------------------  Chemistries   Recent Labs Lab 08/25/16 0448 08/26/16 0425 08/30/16 2106  NA 136 138 134*  K 4.2 4.2 4.1  CL 102 107 101  CO2 26 26 22   GLUCOSE 105* 96 99  BUN 21* 24* 22*  CREATININE 1.03 0.98 1.23  CALCIUM 9.2 8.6* 9.7  AST 22  --   --   ALT 22  --   --   ALKPHOS 65  --   --   BILITOT 1.2  --   --    ------------------------------------------------------------------------------------------------------------------ estimated creatinine clearance is 101.7 mL/min (by C-G formula based on SCr of 1.23 mg/dL). ------------------------------------------------------------------------------------------------------------------ No results for input(s): TSH, T4TOTAL, T3FREE, THYROIDAB in the last 72 hours.  Invalid input(s): FREET3   Coagulation profile No results for input(s): INR, PROTIME in the last 168  hours. ------------------------------------------------------------------------------------------------------------------- No results for input(s): DDIMER in the last 72 hours. -------------------------------------------------------------------------------------------------------------------  Cardiac Enzymes No results for input(s): CKMB, TROPONINI, MYOGLOBIN in the last 168 hours.  Invalid input(s): CK ------------------------------------------------------------------------------------------------------------------ Invalid input(s): POCBNP  ---------------------------------------------------------------------------------------------------------------  Urinalysis No results found for: COLORURINE, APPEARANCEUR, LABSPEC, PHURINE, GLUCOSEU, HGBUR, BILIRUBINUR, KETONESUR, PROTEINUR, UROBILINOGEN, NITRITE, LEUKOCYTESUR   RADIOLOGY: No results found.  EKG: No orders found for this or any previous visit.  IMPRESSION AND PLAN: 41 year old male patient with history of anxiety disorder presented to the emergency room with headache. Admitting diagnosis 1. Intractable headache 2. RMSF meningitis 3. Anxiety disorder 4. Leukocytosis Treatment plan Infectious disease consultation Start patient on IV doxycycline Pain management for headache with oral Percocet and when necessary Toradol Follow-up WBC count  All the records are reviewed and case discussed with ED provider. Management plans discussed with the patient, family and they are in agreement.  CODE STATUS:FULL CODE Code Status History    Date Active Date Inactive Code Status Order ID Comments User Context   08/25/2016  8:51 AM 08/26/2016  2:04 PM Full Code [id number]  Adrian SaranMody, Sital, MD Inpatient   12/13/2014  8:45 AM 12/14/2014  1:14 PM Full Code [id number]  Enedina FinnerPatel, Sona, MD Inpatient    Advance Directive  Documentation     Most Recent Value  Type of Advance Directive  Healthcare Power of Attorney  Pre-existing out of facility DNR order  (yellow form or pink MOST form)  -  "MOST" Form in Place?  -       TOTAL TIME TAKING CARE OF THIS PATIENT: 50 minutes.    Ihor Austin M.D on 08/31/2016 at 3:26 AM  Between 7am to 6pm - Pager - [phone]  After 6pm go to [email or web address] - password [email or web address]  [facility]  Office  [phone]  CC: Primary care physician; System, Pcp Not In

## 2016-09-04 DIAGNOSIS — Z8661 Personal history of infections of the central nervous system: Secondary | ICD-10-CM | POA: Insufficient documentation

## 2017-07-28 DIAGNOSIS — F419 Anxiety disorder, unspecified: Secondary | ICD-10-CM | POA: Diagnosis not present

## 2017-07-28 DIAGNOSIS — Z7189 Other specified counseling: Secondary | ICD-10-CM | POA: Diagnosis not present

## 2017-07-28 DIAGNOSIS — J310 Chronic rhinitis: Secondary | ICD-10-CM | POA: Diagnosis not present

## 2017-07-28 DIAGNOSIS — Z23 Encounter for immunization: Secondary | ICD-10-CM | POA: Diagnosis not present

## 2017-08-26 DIAGNOSIS — Z23 Encounter for immunization: Secondary | ICD-10-CM | POA: Diagnosis not present

## 2018-09-01 DIAGNOSIS — F419 Anxiety disorder, unspecified: Secondary | ICD-10-CM | POA: Diagnosis not present

## 2019-05-16 ENCOUNTER — Encounter: Payer: Self-pay | Admitting: Nurse Practitioner

## 2019-05-18 ENCOUNTER — Other Ambulatory Visit: Payer: Self-pay

## 2019-05-18 ENCOUNTER — Encounter: Payer: Self-pay | Admitting: Nurse Practitioner

## 2019-05-18 ENCOUNTER — Ambulatory Visit: Payer: BC Managed Care – PPO | Admitting: Nurse Practitioner

## 2019-05-18 VITALS — BP 125/85 | HR 93 | Temp 98.8°F | Ht 70.28 in | Wt 246.2 lb

## 2019-05-18 DIAGNOSIS — Z7689 Persons encountering health services in other specified circumstances: Secondary | ICD-10-CM | POA: Diagnosis not present

## 2019-05-18 DIAGNOSIS — Z6835 Body mass index (BMI) 35.0-35.9, adult: Secondary | ICD-10-CM

## 2019-05-18 DIAGNOSIS — F418 Other specified anxiety disorders: Secondary | ICD-10-CM | POA: Insufficient documentation

## 2019-05-18 DIAGNOSIS — F325 Major depressive disorder, single episode, in full remission: Secondary | ICD-10-CM

## 2019-05-18 DIAGNOSIS — R220 Localized swelling, mass and lump, head: Secondary | ICD-10-CM

## 2019-05-18 DIAGNOSIS — Z8619 Personal history of other infectious and parasitic diseases: Secondary | ICD-10-CM

## 2019-05-18 DIAGNOSIS — E6609 Other obesity due to excess calories: Secondary | ICD-10-CM

## 2019-05-18 DIAGNOSIS — E669 Obesity, unspecified: Secondary | ICD-10-CM | POA: Insufficient documentation

## 2019-05-18 MED ORDER — PREDNISONE 20 MG PO TABS: 40.0000 mg | ORAL_TABLET | Freq: Every day | ORAL | 0 refills | Status: AC

## 2019-05-18 MED ORDER — FLUTICASONE PROPIONATE 50 MCG/ACT NA SUSP: 2.0000 | Freq: Every day | NASAL | 8 refills | Status: DC

## 2019-05-18 NOTE — Assessment & Plan Note (Signed)
Recommend continued focus on healthy diet choices and regular physical activity (30 minutes 5 days a week).  

## 2019-05-18 NOTE — Assessment & Plan Note (Signed)
Chronic, stable.  Denies SI/HI.  Continue Prozac daily and adjust as needed.  Return in 6 weeks for annual physical.

## 2019-05-18 NOTE — Assessment & Plan Note (Signed)
In 2018, with major treatment for several months.  Denies any acute issues at this time.

## 2019-05-18 NOTE — Assessment & Plan Note (Signed)
To right nare, swelling and erythema present.  Has had to have sinus surgery to this area before.  Will place referral to previous ENT in [facility] for further assessment and send in Prednisone script.  Continue daily Flonase.

## 2019-05-18 NOTE — Patient Instructions (Signed)
Living With Depression Everyone experiences occasional disappointment, sadness, and loss in their lives. When you are feeling down, blue, or sad for at least 2 weeks in a row, it may mean that you have depression. Depression can affect your thoughts and feelings, relationships, daily activities, and physical health. It is caused by changes in the way your brain functions. If you receive a diagnosis of depression, your health care provider will tell you which type of depression you have and what treatment options are available to you. If you are living with depression, there are ways to help you recover from it and also ways to prevent it from coming back. How to cope with lifestyle changes Coping with stress     Stress is your body's reaction to life changes and events, both good and bad. Stressful situations may include:  Getting married.  The death of a spouse.  Losing a job.  Retiring.  Having a baby. Stress can last just a few hours or it can be ongoing. Stress can play a major role in depression, so it is important to learn both how to cope with stress and how to think about it differently. Talk with your health care provider or a counselor if you would like to learn more about stress reduction. He or she may suggest some stress reduction techniques, such as:  Music therapy. This can include creating music or listening to music. Choose music that you enjoy and that inspires you.  Mindfulness-based meditation. This kind of meditation can be done while sitting or walking. It involves being aware of your normal breaths, rather than trying to control your breathing.  Centering prayer. This is a kind of meditation that involves focusing on a spiritual word or phrase. Choose a word, phrase, or sacred image that is meaningful to you and that brings you peace.  Deep breathing. To do this, expand your stomach and inhale slowly through your nose. Hold your breath for 3-5 seconds, then exhale  slowly, allowing your stomach muscles to relax.  Muscle relaxation. This involves intentionally tensing muscles then relaxing them. Choose a stress reduction technique that fits your lifestyle and personality. Stress reduction techniques take time and practice to develop. Set aside 5-15 minutes a day to do them. Therapists can offer training in these techniques. The training may be covered by some insurance plans. Other things you can do to manage stress include:  Keeping a stress diary. This can help you learn what triggers your stress and ways to control your response.  Understanding what your limits are and saying no to requests or events that lead to a schedule that is too full.  Thinking about how you respond to certain situations. You may not be able to control everything, but you can control how you react.  Adding humor to your life by watching funny films or TV shows.  Making time for activities that help you relax and not feeling guilty about spending your time this way.  Medicines Your health care provider may suggest certain medicines if he or she feels that they will help improve your condition. Avoid using alcohol and other substances that may prevent your medicines from working properly (may interact). It is also important to:  Talk with your pharmacist or health care provider about all the medicines that you take, their possible side effects, and what medicines are safe to take together.  Make it your goal to take part in all treatment decisions (shared decision-making). This includes giving input on   the side effects of medicines. It is best if shared decision-making with your health care provider is part of your total treatment plan. If your health care provider prescribes a medicine, you may not notice the full benefits of it for 4-8 weeks. Most people who are treated for depression need to be on medicine for at least 6-12 months after they feel better. If you are taking  medicines as part of your treatment, do not stop taking medicines without first talking to your health care provider. You may need to have the medicine slowly decreased (tapered) over time to decrease the risk of harmful side effects. Relationships Your health care provider may suggest family therapy along with individual therapy and drug therapy. While there may not be family problems that are causing you to feel depressed, it is still important to make sure your family learns as much as they can about your mental health. Having your family's support can help make your treatment successful. How to recognize changes in your condition Everyone has a different response to treatment for depression. Recovery from major depression happens when you have not had signs of major depression for two months. This may mean that you will start to:  Have more interest in doing activities.  Feel less hopeless than you did 2 months ago.  Have more energy.  Overeat less often, or have better or improving appetite.  Have better concentration. Your health care provider will work with you to decide the next steps in your recovery. It is also important to recognize when your condition is getting worse. Watch for these signs:  Having fatigue or low energy.  Eating too much or too little.  Sleeping too much or too little.  Feeling restless, agitated, or hopeless.  Having trouble concentrating or making decisions.  Having unexplained physical complaints.  Feeling irritable, angry, or aggressive. Get help as soon as you or your family members notice these symptoms coming back. How to get support and help from others How to talk with friends and family members about your condition  Talking to friends and family members about your condition can provide you with one way to get support and guidance. Reach out to trusted friends or family members, explain your symptoms to them, and let them know that you are  working with a health care provider to treat your depression. Financial resources Not all insurance plans cover mental health care, so it is important to check with your insurance carrier. If paying for co-pays or counseling services is a problem, search for a local or county mental health care center. They may be able to offer public mental health care services at low or no cost when you are not able to see a private health care provider. If you are taking medicine for depression, you may be able to get the generic form, which may be less expensive. Some makers of prescription medicines also offer help to patients who cannot afford the medicines they need. Follow these instructions at home:   Get the right amount and quality of sleep.  Cut down on using caffeine, tobacco, alcohol, and other potentially harmful substances.  Try to exercise, such as walking or lifting small weights.  Take over-the-counter and prescription medicines only as told by your health care provider.  Eat a healthy diet that includes plenty of vegetables, fruits, whole grains, low-fat dairy products, and lean protein. Do not eat a lot of foods that are high in solid fats, added sugars, or salt.    Keep all follow-up visits as told by your health care provider. This is important. Contact a health care provider if:  You stop taking your antidepressant medicines, and you have any of these symptoms: ? Nausea. ? Headache. ? Feeling lightheaded. ? Chills and body aches. ? Not being able to sleep (insomnia).  You or your friends and family think your depression is getting worse. Get help right away if:  You have thoughts of hurting yourself or others. If you ever feel like you may hurt yourself or others, or have thoughts about taking your own life, get help right away. You can go to your nearest emergency department or call:  Your local emergency services (911 in the [place]  A suicide crisis helpline, such as the  National Suicide Prevention Lifeline at 1-800-273-8255. This is open 24-hours a day. Summary  If you are living with depression, there are ways to help you recover from it and also ways to prevent it from coming back.  Work with your health care team to create a management plan that includes counseling, stress management techniques, and healthy lifestyle habits. This information is not intended to replace advice given to you by your health care provider. Make sure you discuss any questions you have with your health care provider. Document Revised: 06/19/2018 Document Reviewed: 01/29/2016 Elsevier Patient Education  2020 [employer]  

## 2019-05-18 NOTE — Progress Notes (Signed)
New Patient Office Visit  Subjective:  Patient ID: Jon Saunders, male    DOB: Nov 21, 1975  Age: 44 y.o. MRN: [id number]  CC:  Chief Complaint  Patient presents with  . Establish Care  . swollen nostril    right    HPI Jon Saunders presents for new patient visit to establish care.  Introduced to Publishing rights manager role and practice setting.  All questions answered.  Is familiar with NP role.  His wife and him have both transferred care to this PCP upon recommendation of relative.  Were being seen by provider in [place] prior to this.  RIGHT NARE EDEMA: In 2018 had [facility] Spotted Fever -- was on abx for 80 straight days.  Had meningitis.  After this had a fungal infection in sinuses and saw ENT in [facility] and saw right sinus was not open correctly, had surgery to correct this.  Since this time has taken Flonase every day to keep area stable.  Dr. Armanda Magic ENT in [place].    Over past 2 months right nostril is swollen shut every.  Tried Afrin, only lasts a couple hours and then swells shut again.  Continues to use Flonase every day.  Nadean Corwin and this helps for a couple hours.  Denies nose bleeds, pain, runny nose, sore throat.  Has some mild congestion.    DEPRESSION Was on Celexa and switched to Prozac 40 MG one year ago.   Mood status: stable Satisfied with current treatment?: no Symptom severity: mild  Duration of current treatment : chronic Side effects: no Medication compliance: good compliance Psychotherapy/counseling: none Previous psychiatric medications: Celexa Anxious mood: no Anhedonia: no Significant weight loss or gain: no Insomnia: none Fatigue: no Feelings of worthlessness or guilt: no Impaired concentration/indecisiveness: no Suicidal ideations: no Hopelessness: no Crying spells: no Depression screen [facility] 2/9 05/18/2019 01/05/2015  Decreased Interest 0 0  Down, Depressed, Hopeless 0 0  PHQ - 2 Score 0 0      Past Medical History:    Diagnosis Date  . Allergy   . Anxiety     Past Surgical History:  Procedure Laterality Date  . WISDOM TOOTH EXTRACTION      Family History  Problem Relation Age of Onset  . Hypertension Mother   . Epilepsy Mother   . Seizures Mother   . Migraines Father   . Alzheimer's disease Father   . Epilepsy Sister   . Seizures Sister     Social History   Socioeconomic History  . Marital status: Married    Spouse name: Not on file  . Number of children: Not on file  . Years of education: Not on file  . Highest education level: Not on file  Occupational History  . Occupation: Tax adviser  Tobacco Use  . Smoking status: Never Smoker  . Smokeless tobacco: Never Used  Substance and Sexual Activity  . Alcohol use: Yes    Alcohol/week: 4.0 standard drinks    Types: 1 Glasses of wine, 3 Standard drinks or equivalent per week    Comment: every couple days   . Drug use: Never  . Sexual activity: Not on file  Other Topics Concern  . Not on file  Social History Narrative  . Not on file   Social Determinants of Health   Financial Resource Strain:   . Difficulty of Paying Living Expenses: Not on file  Food Insecurity:   . Worried About Programme researcher, broadcasting/film/video in the Last Year: Not  on file  . Ran Out of Food in the Last Year: Not on file  Transportation Needs:   . Lack of Transportation (Medical): Not on file  . Lack of Transportation (Non-Medical): Not on file  Physical Activity:   . Days of Exercise per Week: Not on file  . Minutes of Exercise per Session: Not on file  Stress:   . Feeling of Stress : Not on file  Social Connections:   . Frequency of Communication with Friends and Family: Not on file  . Frequency of Social Gatherings with Friends and Family: Not on file  . Attends Religious Services: Not on file  . Active Member of Clubs or Organizations: Not on file  . Attends Banker Meetings: Not on file  . Marital Status: Not on file  Intimate Partner Violence:    . Fear of Current or Ex-Partner: Not on file  . Emotionally Abused: Not on file  . Physically Abused: Not on file  . Sexually Abused: Not on file    ROS Review of Systems  Constitutional: Negative for activity change, diaphoresis, fatigue and fever.  HENT: Positive for congestion. Negative for nosebleeds, postnasal drip, rhinorrhea, sinus pressure, sinus pain and sore throat.   Respiratory: Negative for cough, chest tightness, shortness of breath and wheezing.   Cardiovascular: Negative for chest pain, palpitations and leg swelling.  Gastrointestinal: Negative.   Neurological: Negative.   Psychiatric/Behavioral: Negative.     Objective:   Today's Vitals: BP 125/85 (BP Location: Right Arm, Cuff Size: Normal)   Pulse 93   Temp 98.8 F (37.1 C) (Oral)   Ht 5' 10.28" (1.785 m)   Wt 246 lb 3.2 oz (111.7 kg)   SpO2 95%   BMI 35.05 kg/m   Physical Exam Vitals and nursing note reviewed.  Constitutional:      General: He is awake. He is not in acute distress.    Appearance: He is well-developed. He is obese. He is not ill-appearing.  HENT:     Head: Normocephalic and atraumatic.     Right Ear: Hearing, tympanic membrane, ear canal and external ear normal. No drainage.     Left Ear: Hearing, tympanic membrane, ear canal and external ear normal. No drainage.     Nose: Mucosal edema present. No nasal tenderness.     Right Nostril: No foreign body.     Left Nostril: No foreign body.     Right Turbinates: Swollen (with erythema).     Left Turbinates: Not swollen.     Right Sinus: No maxillary sinus tenderness or frontal sinus tenderness.     Left Sinus: No maxillary sinus tenderness or frontal sinus tenderness.     Mouth/Throat:     Pharynx: Uvula midline.  Eyes:     General: Lids are normal.        Right eye: No discharge.        Left eye: No discharge.     Conjunctiva/sclera: Conjunctivae normal.     Pupils: Pupils are equal, round, and reactive to light.  Neck:      Thyroid: No thyromegaly.     Vascular: No carotid bruit.  Cardiovascular:     Rate and Rhythm: Normal rate and regular rhythm.     Heart sounds: Normal heart sounds, S1 normal and S2 normal. No murmur. No gallop.   Pulmonary:     Effort: Pulmonary effort is normal. No accessory muscle usage or respiratory distress.     Breath sounds: Normal breath sounds.  Abdominal:     General: Bowel sounds are normal.     Palpations: Abdomen is soft.  Musculoskeletal:        General: Normal range of motion.     Cervical back: Normal range of motion and neck supple.     Right lower leg: No edema.     Left lower leg: No edema.  Lymphadenopathy:     Cervical: No cervical adenopathy.  Skin:    General: Skin is warm and dry.     Capillary Refill: Capillary refill takes less than 2 seconds.     Findings: No rash.  Neurological:     Mental Status: He is alert and oriented to person, place, and time.     Deep Tendon Reflexes: Reflexes are normal and symmetric.  Psychiatric:        Attention and Perception: Attention normal.        Mood and Affect: Mood normal.        Speech: Speech normal.        Behavior: Behavior normal. Behavior is cooperative.     Assessment & Plan:   Problem List Items Addressed This Visit    None      Outpatient Encounter Medications as of 05/18/2019  Medication Sig  . FLUoxetine (PROZAC) 40 MG capsule Take 40 mg by mouth daily.  . fluticasone (FLONASE) 50 MCG/ACT nasal spray Place 2 sprays into both nostrils daily.  . [DISCONTINUED] butalbital-acetaminophen-caffeine (FIORICET, ESGIC) 50-325-40 MG tablet Take 2 tablets by mouth every 4 (four) hours as needed for headache.  . [DISCONTINUED] citalopram (CELEXA) 40 MG tablet Take 40 mg by mouth daily.  . [DISCONTINUED] fexofenadine-pseudoephedrine (ALLEGRA-D) 60-120 MG 12 hr tablet Take 1 tablet by mouth 2 (two) times daily.  . [DISCONTINUED] HYDROcodone-acetaminophen (NORCO/VICODIN) 5-325 MG tablet Take 1-2 tablets by  mouth every 4 (four) hours as needed for moderate pain.  . [DISCONTINUED] SUMAtriptan (IMITREX) 50 MG tablet Take 1 tablet (50 mg total) by mouth every 2 (two) hours as needed for migraine or headache. May repeat in 2 hours if headache persists or recurs.   No facility-administered encounter medications on file as of 05/18/2019.    Follow-up: No follow-ups on file.   Venita Lick, NP

## 2019-06-02 DIAGNOSIS — J0181 Other acute recurrent sinusitis: Secondary | ICD-10-CM | POA: Diagnosis not present

## 2019-06-02 DIAGNOSIS — J312 Chronic pharyngitis: Secondary | ICD-10-CM | POA: Diagnosis not present

## 2019-06-29 ENCOUNTER — Telehealth: Payer: Self-pay | Admitting: Nurse Practitioner

## 2019-06-29 ENCOUNTER — Encounter: Payer: BC Managed Care – PPO | Admitting: Nurse Practitioner

## 2019-06-29 NOTE — Telephone Encounter (Signed)
Unable to lvm to reschedule physical.

## 2019-06-29 NOTE — Telephone Encounter (Signed)
-----   Message from [name], NP sent at 06/29/2019 10:17 AM EDT ----- Please call to see if he wishes to reschedule.  Missed physical today.

## 2019-07-05 ENCOUNTER — Ambulatory Visit: Payer: BC Managed Care – PPO | Admitting: Nurse Practitioner

## 2019-07-05 ENCOUNTER — Encounter: Payer: Self-pay | Admitting: Nurse Practitioner

## 2019-07-05 ENCOUNTER — Other Ambulatory Visit: Payer: Self-pay

## 2019-07-05 ENCOUNTER — Ambulatory Visit (INDEPENDENT_AMBULATORY_CARE_PROVIDER_SITE_OTHER): Payer: BC Managed Care – PPO | Admitting: Nurse Practitioner

## 2019-07-05 VITALS — BP 116/73 | HR 85 | Temp 98.4°F | Ht 70.5 in | Wt 246.2 lb

## 2019-07-05 DIAGNOSIS — Z136 Encounter for screening for cardiovascular disorders: Secondary | ICD-10-CM | POA: Diagnosis not present

## 2019-07-05 DIAGNOSIS — Z0000 Encounter for general adult medical examination without abnormal findings: Secondary | ICD-10-CM

## 2019-07-05 DIAGNOSIS — E6609 Other obesity due to excess calories: Secondary | ICD-10-CM

## 2019-07-05 DIAGNOSIS — F325 Major depressive disorder, single episode, in full remission: Secondary | ICD-10-CM | POA: Diagnosis not present

## 2019-07-05 DIAGNOSIS — Z6834 Body mass index (BMI) 34.0-34.9, adult: Secondary | ICD-10-CM | POA: Diagnosis not present

## 2019-07-05 DIAGNOSIS — J0181 Other acute recurrent sinusitis: Secondary | ICD-10-CM | POA: Diagnosis not present

## 2019-07-05 MED ORDER — FLUOXETINE HCL 40 MG PO CAPS: 40.0000 mg | ORAL_CAPSULE | Freq: Every day | ORAL | 4 refills | Status: DC

## 2019-07-05 NOTE — Patient Instructions (Addendum)
Living With Depression Everyone experiences occasional disappointment, sadness, and loss in their lives. When you are feeling down, blue, or sad for at least 2 weeks in a row, it may mean that you have depression. Depression can affect your thoughts and feelings, relationships, daily activities, and physical health. It is caused by changes in the way your brain functions. If you receive a diagnosis of depression, your health care provider will tell you which type of depression you have and what treatment options are available to you. If you are living with depression, there are ways to help you recover from it and also ways to prevent it from coming back. How to cope with lifestyle changes Coping with stress     Stress is your body's reaction to life changes and events, both good and bad. Stressful situations may include:  Getting married.  The death of a spouse.  Losing a job.  Retiring.  Having a baby. Stress can last just a few hours or it can be ongoing. Stress can play a major role in depression, so it is important to learn both how to cope with stress and how to think about it differently. Talk with your health care provider or a counselor if you would like to learn more about stress reduction. He or she may suggest some stress reduction techniques, such as:  Music therapy. This can include creating music or listening to music. Choose music that you enjoy and that inspires you.  Mindfulness-based meditation. This kind of meditation can be done while sitting or walking. It involves being aware of your normal breaths, rather than trying to control your breathing.  Centering prayer. This is a kind of meditation that involves focusing on a spiritual word or phrase. Choose a word, phrase, or sacred image that is meaningful to you and that brings you peace.  Deep breathing. To do this, expand your stomach and inhale slowly through your nose. Hold your breath for 3-5 seconds, then exhale  slowly, allowing your stomach muscles to relax.  Muscle relaxation. This involves intentionally tensing muscles then relaxing them. Choose a stress reduction technique that fits your lifestyle and personality. Stress reduction techniques take time and practice to develop. Set aside 5-15 minutes a day to do them. Therapists can offer training in these techniques. The training may be covered by some insurance plans. Other things you can do to manage stress include:  Keeping a stress diary. This can help you learn what triggers your stress and ways to control your response.  Understanding what your limits are and saying no to requests or events that lead to a schedule that is too full.  Thinking about how you respond to certain situations. You may not be able to control everything, but you can control how you react.  Adding humor to your life by watching funny films or TV shows.  Making time for activities that help you relax and not feeling guilty about spending your time this way.  Medicines Your health care provider may suggest certain medicines if he or she feels that they will help improve your condition. Avoid using alcohol and other substances that may prevent your medicines from working properly (may interact). It is also important to:  Talk with your pharmacist or health care provider about all the medicines that you take, their possible side effects, and what medicines are safe to take together.  Make it your goal to take part in all treatment decisions (shared decision-making). This includes giving input on   the side effects of medicines. It is best if shared decision-making with your health care provider is part of your total treatment plan. If your health care provider prescribes a medicine, you may not notice the full benefits of it for 4-8 weeks. Most people who are treated for depression need to be on medicine for at least 6-12 months after they feel better. If you are taking  medicines as part of your treatment, do not stop taking medicines without first talking to your health care provider. You may need to have the medicine slowly decreased (tapered) over time to decrease the risk of harmful side effects. Relationships Your health care provider may suggest family therapy along with individual therapy and drug therapy. While there may not be family problems that are causing you to feel depressed, it is still important to make sure your family learns as much as they can about your mental health. Having your family's support can help make your treatment successful. How to recognize changes in your condition Everyone has a different response to treatment for depression. Recovery from major depression happens when you have not had signs of major depression for two months. This may mean that you will start to:  Have more interest in doing activities.  Feel less hopeless than you did 2 months ago.  Have more energy.  Overeat less often, or have better or improving appetite.  Have better concentration. Your health care provider will work with you to decide the next steps in your recovery. It is also important to recognize when your condition is getting worse. Watch for these signs:  Having fatigue or low energy.  Eating too much or too little.  Sleeping too much or too little.  Feeling restless, agitated, or hopeless.  Having trouble concentrating or making decisions.  Having unexplained physical complaints.  Feeling irritable, angry, or aggressive. Get help as soon as you or your family members notice these symptoms coming back. How to get support and help from others How to talk with friends and family members about your condition  Talking to friends and family members about your condition can provide you with one way to get support and guidance. Reach out to trusted friends or family members, explain your symptoms to them, and let them know that you are  working with a health care provider to treat your depression. Financial resources Not all insurance plans cover mental health care, so it is important to check with your insurance carrier. If paying for co-pays or counseling services is a problem, search for a local or county mental health care center. They may be able to offer public mental health care services at low or no cost when you are not able to see a private health care provider. If you are taking medicine for depression, you may be able to get the generic form, which may be less expensive. Some makers of prescription medicines also offer help to patients who cannot afford the medicines they need. Follow these instructions at home:   Get the right amount and quality of sleep.  Cut down on using caffeine, tobacco, alcohol, and other potentially harmful substances.  Try to exercise, such as walking or lifting small weights.  Take over-the-counter and prescription medicines only as told by your health care provider.  Eat a healthy diet that includes plenty of vegetables, fruits, whole grains, low-fat dairy products, and lean protein. Do not eat a lot of foods that are high in solid fats, added sugars, or salt.    Keep all follow-up visits as told by your health care provider. This is important. Contact a health care provider if:  You stop taking your antidepressant medicines, and you have any of these symptoms: ? Nausea. ? Headache. ? Feeling lightheaded. ? Chills and body aches. ? Not being able to sleep (insomnia).  You or your friends and family think your depression is getting worse. Get help right away if:  You have thoughts of hurting yourself or others. If you ever feel like you may hurt yourself or others, or have thoughts about taking your own life, get help right away. You can go to your nearest emergency department or call:  Your local emergency services (911 in the [place]  A suicide crisis helpline, such as the  [place] at (862)342-9349. This is open 24-hours a day. Summary  If you are living with depression, there are ways to help you recover from it and also ways to prevent it from coming back.  Work with your health care team to create a management plan that includes counseling, stress management techniques, and healthy lifestyle habits. This information is not intended to replace advice given to you by your health care provider. Make sure you discuss any questions you have with your health care provider. Document Revised: 06/19/2018 Document Reviewed: 01/29/2016 Elsevier Patient Education  2020 [place] Maintenance, Male Adopting a healthy lifestyle and getting preventive care are important in promoting health and wellness. Ask your health care provider about:  The right schedule for you to have regular tests and exams.  Things you can do on your own to prevent diseases and keep yourself healthy. What should I know about diet, weight, and exercise? Eat a healthy diet   Eat a diet that includes plenty of vegetables, fruits, low-fat dairy products, and lean protein.  Do not eat a lot of foods that are high in solid fats, added sugars, or sodium. Maintain a healthy weight Body mass index (BMI) is a measurement that can be used to identify possible weight problems. It estimates body fat based on height and weight. Your health care provider can help determine your BMI and help you achieve or maintain a healthy weight. Get regular exercise Get regular exercise. This is one of the most important things you can do for your health. Most adults should:  Exercise for at least 150 minutes each week. The exercise should increase your heart rate and make you sweat (moderate-intensity exercise).  Do strengthening exercises at least twice a week. This is in addition to the moderate-intensity exercise.  Spend less time sitting. Even light physical activity can  be beneficial. Watch cholesterol and blood lipids Have your blood tested for lipids and cholesterol at 42 years of age, then have this test every 5 years. You may need to have your cholesterol levels checked more often if:  Your lipid or cholesterol levels are high.  You are older than 42 years of age.  You are at high risk for heart disease. What should I know about cancer screening? Many types of cancers can be detected early and may often be prevented. Depending on your health history and family history, you may need to have cancer screening at various ages. This may include screening for:  Colorectal cancer.  Prostate cancer.  Skin cancer.  Lung cancer. What should I know about heart disease, diabetes, and high blood pressure? Blood pressure and heart disease  High blood pressure causes heart disease and increases  the risk of stroke. This is more likely to develop in people who have high blood pressure readings, are of African descent, or are overweight.  Talk with your health care provider about your target blood pressure readings.  Have your blood pressure checked: ? Every 3-5 years if you are 38-42 years of age. ? Every year if you are 30 years old or older.  If you are between the ages of 73 and 57 and are a current or former smoker, ask your health care provider if you should have a one-time screening for abdominal aortic aneurysm (AAA). Diabetes Have regular diabetes screenings. This checks your fasting blood sugar level. Have the screening done:  Once every three years after age 90 if you are at a normal weight and have a low risk for diabetes.  More often and at a younger age if you are overweight or have a high risk for diabetes. What should I know about preventing infection? Hepatitis B If you have a higher risk for hepatitis B, you should be screened for this virus. Talk with your health care provider to find out if you are at risk for hepatitis B  infection. Hepatitis C Blood testing is recommended for:  Everyone born from [birth year] through 1963.  Anyone with known risk factors for hepatitis C. Sexually transmitted infections (STIs)  You should be screened each year for STIs, including gonorrhea and chlamydia, if: ? You are sexually active and are younger than 42 years of age. ? You are older than 42 years of age and your health care provider tells you that you are at risk for this type of infection. ? Your sexual activity has changed since you were last screened, and you are at increased risk for chlamydia or gonorrhea. Ask your health care provider if you are at risk.  Ask your health care provider about whether you are at high risk for HIV. Your health care provider may recommend a prescription medicine to help prevent HIV infection. If you choose to take medicine to prevent HIV, you should first get tested for HIV. You should then be tested every 3 months for as long as you are taking the medicine. Follow these instructions at home: Lifestyle  Do not use any products that contain nicotine or tobacco, such as cigarettes, e-cigarettes, and chewing tobacco. If you need help quitting, ask your health care provider.  Do not use street drugs.  Do not share needles.  Ask your health care provider for help if you need support or information about quitting drugs. Alcohol use  Do not drink alcohol if your health care provider tells you not to drink.  If you drink alcohol: ? Limit how much you have to 0-2 drinks a day. ? Be aware of how much alcohol is in your drink. In the [place] one drink equals one 12 oz bottle of beer (355 mL), one 5 oz glass of wine (148 mL), or one 1 oz glass of hard liquor (44 mL). General instructions  Schedule regular health, dental, and eye exams.  Stay current with your vaccines.  Tell your health care provider if: ? You often feel depressed. ? You have ever been abused or do not feel safe at  home. Summary  Adopting a healthy lifestyle and getting preventive care are important in promoting health and wellness.  Follow your health care provider's instructions about healthy diet, exercising, and getting tested or screened for diseases.  Follow your health care provider's instructions on monitoring your  cholesterol and blood pressure. This information is not intended to replace advice given to you by your health care provider. Make sure you discuss any questions you have with your health care provider. Document Revised: 02/18/2018 Document Reviewed: 02/18/2018 Elsevier Patient Education  2020 [employer].  American Heart Association [facility]) Exercise Recommendation  Being physically active is important to prevent heart disease and stroke, the nation's No. 1and No. 5killers. To improve overall cardiovascular health, we suggest at least 150 minutes per week of moderate exercise or 75 minutes per week of vigorous exercise (or a combination of moderate and vigorous activity). Thirty minutes a day, five times a week is an easy goal to remember. You will also experience benefits even if you divide your time into two or three segments of 10 to 15 minutes per day.  For people who would benefit from lowering their blood pressure or cholesterol, we recommend 40 minutes of aerobic exercise of moderate to vigorous intensity three to four times a week to lower the risk for heart attack and stroke.  Physical activity is anything that makes you move your body and burn calories.  This includes things like climbing stairs or playing sports. Aerobic exercises benefit your heart, and include walking, jogging, swimming or biking. Strength and stretching exercises are best for overall stamina and flexibility.  The simplest, positive change you can make to effectively improve your heart health is to start walking. It's enjoyable, free, easy, social and great exercise. A walking program is flexible and boasts  high success rates because people can stick with it. It's easy for walking to become a regular and satisfying part of life.   For Overall Cardiovascular Health:  At least 30 minutes of moderate-intensity aerobic activity at least 5 days per week for a total of 150  OR   At least 25 minutes of vigorous aerobic activity at least 3 days per week for a total of 75 minutes; or a combination of moderate- and vigorous-intensity aerobic activity  AND   Moderate- to high-intensity muscle-strengthening activity at least 2 days per week for additional health benefits.  For Lowering Blood Pressure and Cholesterol  An average 40 minutes of moderate- to vigorous-intensity aerobic activity 3 or 4 times per week  What if I can't make it to the time goal? Something is always better than nothing! And everyone has to start somewhere. Even if you've been sedentary for years, today is the day you can begin to make healthy changes in your life. If you don't think you'll make it for 30 or 40 minutes, set a reachable goal for today. You can work up toward your overall goal by increasing your time as you get stronger. Don't let all-or-nothing thinking rob you of doing what you can every day.  Source:http://www.heart.org

## 2019-07-05 NOTE — Assessment & Plan Note (Addendum)
Recommended eating smaller high protein, low fat meals more frequently and exercising 30 mins a day 5 times a week with a goal of 10-15lb weight loss in the next 3 months. Patient voiced their understanding and motivation to adhere to these recommendations.  Obtain labs today TSH, lipid, CMP, CBC.

## 2019-07-05 NOTE — Progress Notes (Signed)
BP 116/73   Pulse 85   Temp 98.4 F (36.9 C) (Oral)   Ht 5' 10.5" (1.791 m)   Wt 246 lb 3.2 oz (111.7 kg)   SpO2 96%   BMI 34.83 kg/m    Subjective:    Patient ID: Jon Saunders, male    DOB: May 25, 1975, 44 y.o.   MRN: [id number]  HPI: Jon Saunders is a 44 y.o. male presenting on 07/05/2019 for comprehensive medical examination. Current medical complaints include:none  He currently lives with: wife Interim Problems from his last visit: no  Functional Status Survey: Is the patient deaf or have difficulty hearing?: No Does the patient have difficulty seeing, even when wearing glasses/contacts?: No Does the patient have difficulty concentrating, remembering, or making decisions?: No Does the patient have difficulty walking or climbing stairs?: No Does the patient have difficulty dressing or bathing?: No Does the patient have difficulty doing errands alone such as visiting a doctor's office or shopping?: No  FALL RISK: Fall Risk  07/05/2019 05/18/2019 01/05/2015  Falls in the past year? 0 0 No  Number falls in past yr: 0 0 -  Injury with Fall? 0 0 -  Follow up Falls evaluation completed - -    Depression Screen Depression screen [facility] 2/9 07/05/2019 05/18/2019 01/05/2015  Decreased Interest 0 0 0  Down, Depressed, Hopeless 0 0 0  PHQ - 2 Score 0 0 0  Altered sleeping 0 - -  Tired, decreased energy 1 - -  Change in appetite 0 - -  Feeling bad or failure about yourself  0 - -  Trouble concentrating 0 - -  Moving slowly or fidgety/restless 0 - -  Suicidal thoughts 0 - -  PHQ-9 Score 1 - -  Difficult doing work/chores Not difficult at all - -    Advanced Directives <no information>  Past Medical History:  Past Medical History:  Diagnosis Date  . Allergy   . Anxiety     Surgical History:  Past Surgical History:  Procedure Laterality Date  . WISDOM TOOTH EXTRACTION      Medications:  Current Outpatient Medications on File Prior to Visit  Medication Sig  .  fluticasone (FLONASE) 50 MCG/ACT nasal spray Place 2 sprays into both nostrils daily.   No current facility-administered medications on file prior to visit.    Allergies:  No Known Allergies  Social History:  Social History   Socioeconomic History  . Marital status: Married    Spouse name: Not on file  . Number of children: Not on file  . Years of education: Not on file  . Highest education level: Not on file  Occupational History  . Occupation: Tax adviser  Tobacco Use  . Smoking status: Never Smoker  . Smokeless tobacco: Never Used  Substance and Sexual Activity  . Alcohol use: Yes    Alcohol/week: 4.0 standard drinks    Types: 1 Glasses of wine, 3 Standard drinks or equivalent per week    Comment: every couple days   . Drug use: Never  . Sexual activity: Yes  Other Topics Concern  . Not on file  Social History Narrative  . Not on file   Social Determinants of Health   Financial Resource Strain:   . Difficulty of Paying Living Expenses:   Food Insecurity:   . Worried About Programme researcher, broadcasting/film/video in the Last Year:   . [employer] of Food in the Last Year:   Transportation Needs:   . Lack of  Transportation (Medical):   Marland Kitchen. Lack of Transportation (Non-Medical):   Physical Activity:   . Days of Exercise per Week:   . Minutes of Exercise per Session:   Stress:   . Feeling of Stress :   Social Connections:   . Frequency of Communication with Friends and Family:   . Frequency of Social Gatherings with Friends and Family:   . Attends Religious Services:   . Active Member of Clubs or Organizations:   . Attends BankerClub or Organization Meetings:   Marland Kitchen. Marital Status:   Intimate Partner Violence:   . Fear of Current or Ex-Partner:   . Emotionally Abused:   Marland Kitchen. Physically Abused:   . Sexually Abused:    Social History   Tobacco Use  Smoking Status Never Smoker  Smokeless Tobacco Never Used   Social History   Substance and Sexual Activity  Alcohol Use Yes  . Alcohol/week:  4.0 standard drinks  . Types: 1 Glasses of wine, 3 Standard drinks or equivalent per week   Comment: every couple days     Family History:  Family History  Problem Relation Age of Onset  . Hypertension Mother   . Epilepsy Mother   . Seizures Mother   . Migraines Father   . Alzheimer's disease Father   . Epilepsy Sister   . Seizures Sister   . Ovarian cancer Maternal Grandmother     Past medical history, surgical history, medications, allergies, family history and social history reviewed with patient today and changes made to appropriate areas of the chart.   Review of Systems - negative All other ROS negative except what is listed above and in the HPI.      Objective:    BP 116/73   Pulse 85   Temp 98.4 F (36.9 C) (Oral)   Ht 5' 10.5" (1.791 m)   Wt 246 lb 3.2 oz (111.7 kg)   SpO2 96%   BMI 34.83 kg/m   Wt Readings from Last 3 Encounters:  07/05/19 246 lb 3.2 oz (111.7 kg)  05/18/19 246 lb 3.2 oz (111.7 kg)  08/31/16 239 lb 14.4 oz (108.8 kg)    Physical Exam Vitals and nursing note reviewed.  Constitutional:      General: He is awake. He is not in acute distress.    Appearance: He is well-developed and well-groomed. He is obese. He is not ill-appearing.  HENT:     Head: Normocephalic and atraumatic.     Right Ear: Hearing, tympanic membrane, ear canal and external ear normal. No drainage.     Left Ear: Hearing, tympanic membrane, ear canal and external ear normal. No drainage.     Nose: Nose normal.     Mouth/Throat:     Pharynx: Uvula midline.  Eyes:     General: Lids are normal.        Right eye: No discharge.        Left eye: No discharge.     Extraocular Movements: Extraocular movements intact.     Conjunctiva/sclera: Conjunctivae normal.     Pupils: Pupils are equal, round, and reactive to light.     Visual Fields: Right eye visual fields normal and left eye visual fields normal.  Neck:     Thyroid: No thyromegaly.     Vascular: No carotid bruit or  JVD.     Trachea: Trachea normal.  Cardiovascular:     Rate and Rhythm: Normal rate and regular rhythm.     Heart sounds: Normal heart sounds,  S1 normal and S2 normal. No murmur. No gallop.   Pulmonary:     Effort: Pulmonary effort is normal. No accessory muscle usage or respiratory distress.     Breath sounds: Normal breath sounds.  Abdominal:     General: Bowel sounds are normal.     Palpations: Abdomen is soft. There is no hepatomegaly or splenomegaly.     Tenderness: There is no abdominal tenderness.  Musculoskeletal:        General: Normal range of motion.     Cervical back: Normal range of motion and neck supple.     Right lower leg: No edema.     Left lower leg: No edema.  Lymphadenopathy:     Head:     Right side of head: No submental, submandibular, tonsillar, preauricular or posterior auricular adenopathy.     Left side of head: No submental, submandibular, tonsillar, preauricular or posterior auricular adenopathy.     Cervical: No cervical adenopathy.  Skin:    General: Skin is warm and dry.     Capillary Refill: Capillary refill takes less than 2 seconds.     Findings: No rash.  Neurological:     Mental Status: He is alert and oriented to person, place, and time.     Cranial Nerves: Cranial nerves are intact.     Gait: Gait is intact.     Deep Tendon Reflexes: Reflexes are normal and symmetric.     Reflex Scores:      Brachioradialis reflexes are 2+ on the right side and 2+ on the left side.      Patellar reflexes are 2+ on the right side and 2+ on the left side. Psychiatric:        Attention and Perception: Attention normal.        Mood and Affect: Mood normal.        Speech: Speech normal.        Behavior: Behavior normal. Behavior is cooperative.        Thought Content: Thought content normal.        Cognition and Memory: Cognition normal.        Judgment: Judgment normal.    Results for orders placed or performed during the hospital encounter of 08/30/16    CBC with Differential  Result Value Ref Range   WBC 15.8 (H) 3.8 - 10.6 K/uL   RBC 6.01 (H) 4.40 - 5.90 MIL/uL   Hemoglobin 17.5 13.0 - 18.0 g/dL   HCT 32.4 40.1 - 02.7 %   MCV 85.2 80.0 - 100.0 fL   MCH 29.2 26.0 - 34.0 pg   MCHC 34.3 32.0 - 36.0 g/dL   RDW 25.3 66.4 - 40.3 %   Platelets 266 150 - 440 K/uL   Neutrophils Relative % 76 %   Neutro Abs 12.1 (H) 1.4 - 6.5 K/uL   Lymphocytes Relative 16 %   Lymphs Abs 2.5 1.0 - 3.6 K/uL   Monocytes Relative 6 %   Monocytes Absolute 0.9 0.2 - 1.0 K/uL   Eosinophils Relative 1 %   Eosinophils Absolute 0.2 0 - 0.7 K/uL   Basophils Relative 1 %   Basophils Absolute 0.1 0 - 0.1 K/uL  Basic metabolic panel  Result Value Ref Range   Sodium 134 (L) 135 - 145 mmol/L   Potassium 4.1 3.5 - 5.1 mmol/L   Chloride 101 101 - 111 mmol/L   CO2 22 22 - 32 mmol/L   Glucose, Bld 99 65 - 99 mg/dL  BUN 22 (H) 6 - 20 mg/dL   Creatinine, Ser 4.09 0.61 - 1.24 mg/dL   Calcium 9.7 8.9 - 73.5 mg/dL   GFR calc non Af Amer >60 >60 mL/min   GFR calc Af Amer >60 >60 mL/min   Anion gap 11 5 - 15  CK  Result Value Ref Range   Total CK 39 (L) 49 - 397 U/L  Basic metabolic panel  Result Value Ref Range   Sodium 135 135 - 145 mmol/L   Potassium 4.3 3.5 - 5.1 mmol/L   Chloride 101 101 - 111 mmol/L   CO2 29 22 - 32 mmol/L   Glucose, Bld 83 65 - 99 mg/dL   BUN 25 (H) 6 - 20 mg/dL   Creatinine, Ser 3.29 0.61 - 1.24 mg/dL   Calcium 8.7 (L) 8.9 - 10.3 mg/dL   GFR calc non Af Amer >60 >60 mL/min   GFR calc Af Amer >60 >60 mL/min   Anion gap 5 5 - 15  CBC  Result Value Ref Range   WBC 11.9 (H) 3.8 - 10.6 K/uL   RBC 5.16 4.40 - 5.90 MIL/uL   Hemoglobin 15.3 13.0 - 18.0 g/dL   HCT 92.4 26.8 - 34.1 %   MCV 88.7 80.0 - 100.0 fL   MCH 29.7 26.0 - 34.0 pg   MCHC 33.5 32.0 - 36.0 g/dL   RDW 96.2 22.9 - 79.8 %   Platelets 189 150 - 440 K/uL      Assessment & Plan:   Problem List Items Addressed This Visit      Other   Obesity    Recommended eating  smaller high protein, low fat meals more frequently and exercising 30 mins a day 5 times a week with a goal of 10-15lb weight loss in the next 3 months. Patient voiced their understanding and motivation to adhere to these recommendations.  Obtain labs today TSH, lipid, CMP, CBC.       Depression, major, single episode, complete remission (HCC)    Chronic, stable.  Denies SI/HI.  Continue Prozac daily and adjust as needed.  Obtain labs today.  Refills sent, 90 day supply with 4 refills.  Return in 6 months.      Relevant Medications   FLUoxetine (PROZAC) 40 MG capsule   Other Relevant Orders   TSH    Other Visit Diagnoses    Annual physical exam    -  Primary   Annual physical labs to include CBC, CMP, TSH, lipid   Relevant Orders   CBC with Differential/Platelet   Comprehensive metabolic panel   Lipid Panel w/o Chol/HDL Ratio      Discussed aspirin prophylaxis for myocardial infarction prevention and decision was it was not indicated  LABORATORY TESTING:  Health maintenance labs ordered today as discussed above.  No family history of Prostate CA -- will start labs PSA at 50, unless symptomatic beforehand.  IMMUNIZATIONS:   - Tdap: Tetanus vaccination status reviewed: last tetanus booster within 10 years. - Influenza: Up to date - Pneumovax: Not applicable - Prevnar: Not applicable - Zostavax vaccine: Not applicable  SCREENING: - Colonoscopy: Not applicable  Discussed with patient purpose of the colonoscopy is to detect colon cancer at curable precancerous or early stages   - AAA Screening: Not applicable  -Hearing Test: Not applicable  -Spirometry: Not applicable   PATIENT COUNSELING:    Sexuality: Discussed sexually transmitted diseases, partner selection, use of condoms, avoidance of unintended pregnancy  and contraceptive alternatives.  Advised to avoid cigarette smoking.  I discussed with the patient that most people either abstain from alcohol or drink within  safe limits (<=14/week and <=4 drinks/occasion for males, <=7/weeks and <= 3 drinks/occasion for females) and that the risk for alcohol disorders and other health effects rises proportionally with the number of drinks per week and how often a drinker exceeds daily limits.  Discussed cessation/primary prevention of drug use and availability of treatment for abuse.   Diet: Encouraged to adjust caloric intake to maintain  or achieve ideal body weight, to reduce intake of dietary saturated fat and total fat, to limit sodium intake by avoiding high sodium foods and not adding table salt, and to maintain adequate dietary potassium and calcium preferably from fresh fruits, vegetables, and low-fat dairy products.    stressed the importance of regular exercise  Injury prevention: Discussed safety belts, safety helmets, smoke detector, smoking near bedding or upholstery.   Dental health: Discussed importance of regular tooth brushing, flossing, and dental visits.   Follow up plan: NEXT PREVENTATIVE PHYSICAL DUE IN 1 YEAR. Return in about 6 months (around 01/04/2020) for MOOD.

## 2019-07-05 NOTE — Assessment & Plan Note (Signed)
Chronic, stable.  Denies SI/HI.  Continue Prozac daily and adjust as needed.  Obtain labs today.  Refills sent, 90 day supply with 4 refills.  Return in 6 months.

## 2019-07-06 LAB — COMPREHENSIVE METABOLIC PANEL
ALT: 25 IU/L (ref 0–44)
AST: 21 IU/L (ref 0–40)
Albumin/Globulin Ratio: 1.7 (ref 1.2–2.2)
Albumin: 4.3 g/dL (ref 4.0–5.0)
Alkaline Phosphatase: 78 IU/L (ref 39–117)
BUN/Creatinine Ratio: 14 (ref 9–20)
BUN: 16 mg/dL (ref 6–24)
Bilirubin Total: 0.4 mg/dL (ref 0.0–1.2)
CO2: 23 mmol/L (ref 20–29)
Calcium: 9.4 mg/dL (ref 8.7–10.2)
Chloride: 101 mmol/L (ref 96–106)
Creatinine, Ser: 1.11 mg/dL (ref 0.76–1.27)
GFR calc Af Amer: 94 mL/min/{1.73_m2} (ref >59)
GFR calc non Af Amer: 81 mL/min/{1.73_m2} (ref >59)
Globulin, Total: 2.5 g/dL (ref 1.5–4.5)
Glucose: 94 mg/dL (ref 65–99)
Potassium: 4.1 mmol/L (ref 3.5–5.2)
Sodium: 138 mmol/L (ref 134–144)
Total Protein: 6.8 g/dL (ref 6.0–8.5)

## 2019-07-06 LAB — CBC WITH DIFFERENTIAL/PLATELET
Basophils Absolute: 0 10*3/uL (ref 0.0–0.2)
Basos: 0 %
EOS (ABSOLUTE): 0.2 10*3/uL (ref 0.0–0.4)
Eos: 2 %
Hematocrit: 45.6 % (ref 37.5–51.0)
Hemoglobin: 15 g/dL (ref 13.0–17.7)
Immature Grans (Abs): 0 10*3/uL (ref 0.0–0.1)
Immature Granulocytes: 0 %
Lymphocytes Absolute: 3.3 10*3/uL — ABNORMAL HIGH (ref 0.7–3.1)
Lymphs: 33 %
MCH: 29.5 pg (ref 26.6–33.0)
MCHC: 32.9 g/dL (ref 31.5–35.7)
MCV: 90 fL (ref 79–97)
Monocytes Absolute: 0.7 10*3/uL (ref 0.1–0.9)
Monocytes: 7 %
Neutrophils Absolute: 5.8 10*3/uL (ref 1.4–7.0)
Neutrophils: 58 %
Platelets: 218 10*3/uL (ref 150–450)
RBC: 5.08 x10E6/uL (ref 4.14–5.80)
RDW: 13.1 % (ref 11.6–15.4)
WBC: 10 10*3/uL (ref 3.4–10.8)

## 2019-07-06 LAB — LIPID PANEL W/O CHOL/HDL RATIO
Cholesterol, Total: 169 mg/dL (ref 100–199)
HDL: 51 mg/dL (ref >39)
LDL Chol Calc (NIH): 86 mg/dL (ref 0–99)
Triglycerides: 186 mg/dL — ABNORMAL HIGH (ref 0–149)
VLDL Cholesterol Cal: 32 mg/dL (ref 5–40)

## 2019-07-06 LAB — TSH: TSH: 2.3 u[IU]/mL (ref 0.450–4.500)

## 2019-07-06 NOTE — Progress Notes (Signed)
Contacted via MyChart Good afternoon Jon Saunders, your labs have returned and overall they look great.  Lymphocytes were mildly elevated (very mild), but WBC normal.  Suspect related to your allergies.  Kidney and liver function are normal.  Cholesterol levels, show mild elevation in triglycerides.  Continue to focus on healthy diet changes.  Thyroid testing normal.  Overall fantastic!!   Keep being awesome!! Kindest regards, Jon Saunders The 10-year ASCVD risk score [name] DC Jr., et al., 2013) is: 0.9%   Values used to calculate the score:     Age: 44 years     Sex: Male     Is Non-Hispanic African American: No     Diabetic: No     Tobacco smoker: No     Systolic Blood Pressure: 116 mmHg     Is BP treated: No     HDL Cholesterol: 51 mg/dL     Total Cholesterol: 169 mg/dL

## 2020-03-20 DIAGNOSIS — Z03818 Encounter for observation for suspected exposure to other biological agents ruled out: Secondary | ICD-10-CM | POA: Diagnosis not present

## 2020-03-30 ENCOUNTER — Other Ambulatory Visit: Payer: BC Managed Care – PPO

## 2020-03-30 DIAGNOSIS — Z20822 Contact with and (suspected) exposure to covid-19: Secondary | ICD-10-CM | POA: Diagnosis not present

## 2020-04-01 LAB — NOVEL CORONAVIRUS, NAA: SARS-CoV-2, NAA: NOT DETECTED

## 2020-04-01 LAB — SARS-COV-2, NAA 2 DAY TAT

## 2020-07-26 ENCOUNTER — Ambulatory Visit: Payer: BC Managed Care – PPO | Admitting: Nurse Practitioner

## 2020-07-31 ENCOUNTER — Other Ambulatory Visit: Payer: Self-pay | Admitting: Nurse Practitioner

## 2020-07-31 NOTE — Telephone Encounter (Signed)
Scheduled 5/24

## 2020-08-01 ENCOUNTER — Ambulatory Visit: Payer: BC Managed Care – PPO | Admitting: Nurse Practitioner

## 2020-12-05 DIAGNOSIS — R350 Frequency of micturition: Secondary | ICD-10-CM | POA: Diagnosis not present

## 2020-12-10 ENCOUNTER — Ambulatory Visit: Admit: 2020-12-10 | Discharge status: Against Medical Advice | Payer: BC Managed Care – PPO

## 2020-12-10 ENCOUNTER — Inpatient Hospital Stay (HOSPITAL_BASED_OUTPATIENT_CLINIC_OR_DEPARTMENT_OTHER)
Admission: EM | Admit: 2020-12-10 | Discharge: 2020-12-14 | DRG: 103 | Disposition: A | Discharge status: Home / Self Care | Payer: BC Managed Care – PPO | Attending: Family Medicine | Admitting: Family Medicine

## 2020-12-10 ENCOUNTER — Other Ambulatory Visit: Payer: Self-pay

## 2020-12-10 ENCOUNTER — Emergency Department (HOSPITAL_BASED_OUTPATIENT_CLINIC_OR_DEPARTMENT_OTHER): Payer: BC Managed Care – PPO

## 2020-12-10 ENCOUNTER — Encounter (HOSPITAL_BASED_OUTPATIENT_CLINIC_OR_DEPARTMENT_OTHER): Payer: Self-pay | Admitting: Emergency Medicine

## 2020-12-10 DIAGNOSIS — M542 Cervicalgia: Secondary | ICD-10-CM | POA: Diagnosis not present

## 2020-12-10 DIAGNOSIS — G039 Meningitis, unspecified: Secondary | ICD-10-CM | POA: Insufficient documentation

## 2020-12-10 DIAGNOSIS — Z8661 Personal history of infections of the central nervous system: Secondary | ICD-10-CM | POA: Diagnosis not present

## 2020-12-10 DIAGNOSIS — F418 Other specified anxiety disorders: Secondary | ICD-10-CM | POA: Diagnosis present

## 2020-12-10 DIAGNOSIS — E669 Obesity, unspecified: Secondary | ICD-10-CM | POA: Diagnosis not present

## 2020-12-10 DIAGNOSIS — J324 Chronic pansinusitis: Secondary | ICD-10-CM | POA: Diagnosis present

## 2020-12-10 DIAGNOSIS — Z8616 Personal history of COVID-19: Secondary | ICD-10-CM | POA: Diagnosis not present

## 2020-12-10 DIAGNOSIS — Z20822 Contact with and (suspected) exposure to covid-19: Secondary | ICD-10-CM | POA: Diagnosis present

## 2020-12-10 DIAGNOSIS — H532 Diplopia: Secondary | ICD-10-CM | POA: Diagnosis not present

## 2020-12-10 DIAGNOSIS — K429 Umbilical hernia without obstruction or gangrene: Secondary | ICD-10-CM | POA: Diagnosis not present

## 2020-12-10 DIAGNOSIS — R9431 Abnormal electrocardiogram [ECG] [EKG]: Secondary | ICD-10-CM | POA: Diagnosis not present

## 2020-12-10 DIAGNOSIS — J3489 Other specified disorders of nose and nasal sinuses: Secondary | ICD-10-CM | POA: Diagnosis not present

## 2020-12-10 DIAGNOSIS — F32A Depression, unspecified: Secondary | ICD-10-CM | POA: Diagnosis not present

## 2020-12-10 DIAGNOSIS — R519 Headache, unspecified: Secondary | ICD-10-CM | POA: Diagnosis not present

## 2020-12-10 DIAGNOSIS — R918 Other nonspecific abnormal finding of lung field: Secondary | ICD-10-CM | POA: Diagnosis not present

## 2020-12-10 DIAGNOSIS — G030 Nonpyogenic meningitis: Secondary | ICD-10-CM | POA: Diagnosis not present

## 2020-12-10 DIAGNOSIS — Z6832 Body mass index (BMI) 32.0-32.9, adult: Secondary | ICD-10-CM

## 2020-12-10 DIAGNOSIS — Z79899 Other long term (current) drug therapy: Secondary | ICD-10-CM | POA: Diagnosis not present

## 2020-12-10 DIAGNOSIS — F419 Anxiety disorder, unspecified: Secondary | ICD-10-CM | POA: Diagnosis not present

## 2020-12-10 HISTORY — DX: Spotted fever due to Rickettsia rickettsii: A77.0

## 2020-12-10 HISTORY — DX: Meningitis, unspecified: G03.9

## 2020-12-10 LAB — COMPREHENSIVE METABOLIC PANEL
ALT: 24 U/L (ref 0–44)
AST: 22 U/L (ref 15–41)
Albumin: 4.5 g/dL (ref 3.5–5.0)
Alkaline Phosphatase: 66 U/L (ref 38–126)
Anion gap: 7 (ref 5–15)
BUN: 14 mg/dL (ref 6–20)
CO2: 29 mmol/L (ref 22–32)
Calcium: 9.4 mg/dL (ref 8.9–10.3)
Chloride: 103 mmol/L (ref 98–111)
Creatinine, Ser: 1 mg/dL (ref 0.61–1.24)
GFR, Estimated: >60 mL/min (ref >60)
Glucose, Bld: 85 mg/dL (ref 70–99)
Potassium: 3.9 mmol/L (ref 3.5–5.1)
Sodium: 139 mmol/L (ref 135–145)
Total Bilirubin: 0.5 mg/dL (ref 0.3–1.2)
Total Protein: 7.6 g/dL (ref 6.5–8.1)

## 2020-12-10 LAB — CSF CELL COUNT WITH DIFFERENTIAL
Eosinophils, CSF: 1 % (ref 0–1)
Eosinophils, CSF: 0 % (ref 0–1)
Lymphs, CSF: 73 % (ref 40–80)
Lymphs, CSF: 94 % — ABNORMAL HIGH (ref 40–80)
Monocyte-Macrophage-Spinal Fluid: 11 % — ABNORMAL LOW (ref 15–45)
Monocyte-Macrophage-Spinal Fluid: 6 % — ABNORMAL LOW (ref 15–45)
RBC Count, CSF: 186 /mm3 — ABNORMAL HIGH
RBC Count, CSF: 12 /mm3 — ABNORMAL HIGH
Segmented Neutrophils-CSF: 15 % — ABNORMAL HIGH (ref 0–6)
Segmented Neutrophils-CSF: 0 % (ref 0–6)
Tube #: 1
Tube #: 4
WBC, CSF: 9 /mm3 — ABNORMAL HIGH (ref 0–5)
WBC, CSF: 13 /mm3 (ref 0–5)

## 2020-12-10 LAB — URINALYSIS, ROUTINE W REFLEX MICROSCOPIC
Bilirubin Urine: NEGATIVE
Glucose, UA: NEGATIVE mg/dL
Hgb urine dipstick: NEGATIVE
Ketones, ur: NEGATIVE mg/dL
Leukocytes,Ua: NEGATIVE
Nitrite: NEGATIVE
Specific Gravity, Urine: >1.046 — ABNORMAL HIGH (ref 1.005–1.030)
pH: 6 (ref 5.0–8.0)

## 2020-12-10 LAB — CBC WITH DIFFERENTIAL/PLATELET
Abs Immature Granulocytes: 0.01 10*3/uL (ref 0.00–0.07)
Basophils Absolute: 0.1 10*3/uL (ref 0.0–0.1)
Basophils Relative: 1 %
Eosinophils Absolute: 0.5 10*3/uL (ref 0.0–0.5)
Eosinophils Relative: 6 %
HCT: 48.9 % (ref 39.0–52.0)
Hemoglobin: 16.3 g/dL (ref 13.0–17.0)
Immature Granulocytes: 0 %
Lymphocytes Relative: 24 %
Lymphs Abs: 1.8 10*3/uL (ref 0.7–4.0)
MCH: 30.2 pg (ref 26.0–34.0)
MCHC: 33.3 g/dL (ref 30.0–36.0)
MCV: 90.7 fL (ref 80.0–100.0)
Monocytes Absolute: 0.6 10*3/uL (ref 0.1–1.0)
Monocytes Relative: 8 %
Neutro Abs: 4.8 10*3/uL (ref 1.7–7.7)
Neutrophils Relative %: 61 %
Platelets: 216 10*3/uL (ref 150–400)
RBC: 5.39 MIL/uL (ref 4.22–5.81)
RDW: 13.2 % (ref 11.5–15.5)
WBC: 7.8 10*3/uL (ref 4.0–10.5)
nRBC: 0 % (ref 0.0–0.2)

## 2020-12-10 LAB — PROTEIN, CSF: Total  Protein, CSF: 64 mg/dL — ABNORMAL HIGH (ref 15–45)

## 2020-12-10 LAB — SEDIMENTATION RATE: Sed Rate: 1 mm/hr (ref 0–16)

## 2020-12-10 LAB — GLUCOSE, CSF: Glucose, CSF: 56 mg/dL (ref 40–70)

## 2020-12-10 LAB — RESP PANEL BY RT-PCR (FLU A&B, COVID) ARPGX2
Influenza A by PCR: NEGATIVE
Influenza B by PCR: NEGATIVE
SARS Coronavirus 2 by RT PCR: NEGATIVE

## 2020-12-10 LAB — C-REACTIVE PROTEIN: CRP: 1 mg/dL — ABNORMAL HIGH (ref <1.0)

## 2020-12-10 MED ORDER — SODIUM CHLORIDE 0.9 % IV SOLN
1.0000 g | Freq: Once | INTRAVENOUS | Status: AC
  Administered 2020-12-10: 1 g via INTRAVENOUS
  Filled 2020-12-10: qty 10

## 2020-12-10 MED ORDER — VANCOMYCIN HCL IN DEXTROSE 1-5 GM/200ML-% IV SOLN
1000.0000 mg | Freq: Once | INTRAVENOUS | Status: AC
  Administered 2020-12-10: 1000 mg via INTRAVENOUS
  Filled 2020-12-10: qty 200

## 2020-12-10 MED ORDER — SODIUM CHLORIDE 0.9 % IV SOLN: Freq: Once | INTRAVENOUS | Status: AC

## 2020-12-10 MED ORDER — SODIUM CHLORIDE 0.9 % IV BOLUS
1000.0000 mL | Freq: Once | INTRAVENOUS | Status: AC
  Administered 2020-12-10: 1000 mL via INTRAVENOUS

## 2020-12-10 MED ORDER — HYDROMORPHONE HCL 1 MG/ML IJ SOLN
0.5000 mg | Freq: Once | INTRAMUSCULAR | Status: AC
  Administered 2020-12-10: 0.5 mg via INTRAVENOUS
  Filled 2020-12-10: qty 1

## 2020-12-10 MED ORDER — IOHEXOL 350 MG/ML SOLN
80.0000 mL | Freq: Once | INTRAVENOUS | Status: AC | PRN
  Administered 2020-12-10: 80 mL via INTRAVENOUS

## 2020-12-10 MED ORDER — ONDANSETRON HCL 4 MG/2ML IJ SOLN
4.0000 mg | Freq: Once | INTRAMUSCULAR | Status: AC
  Administered 2020-12-10: 4 mg via INTRAVENOUS
  Filled 2020-12-10: qty 2

## 2020-12-10 MED ORDER — LIDOCAINE HCL (PF) 1 % IJ SOLN
INTRAMUSCULAR | Status: AC
  Administered 2020-12-10: 5 mL
  Filled 2020-12-10: qty 10

## 2020-12-10 MED ORDER — HYDROMORPHONE HCL 1 MG/ML IJ SOLN
1.0000 mg | Freq: Once | INTRAMUSCULAR | Status: AC
  Administered 2020-12-10: 1 mg via INTRAVENOUS
  Filled 2020-12-10: qty 1

## 2020-12-10 MED ORDER — SODIUM CHLORIDE 0.9 % IV SOLN: 2.0000 g | Freq: Once | INTRAVENOUS | Status: DC

## 2020-12-10 NOTE — ED Notes (Signed)
Patient transported to CT 

## 2020-12-10 NOTE — ED Notes (Signed)
ED Provider at bedside. 

## 2020-12-10 NOTE — ED Notes (Signed)
ED Provider at bedside.  Lumbar Puncher.

## 2020-12-10 NOTE — ED Provider Notes (Signed)
[facility] EMERGENCY DEPT Provider Note   CSN: [id number] Arrival date & time: 12/10/20  1521     History Chief Complaint  Patient presents with   Headache    Jon Saunders is a 45 y.o. male.  45 year old male with history of meningitis (2016 (LP inconclusive, thought to have started as sinusitis, 2018 from [facility]), COVID (08/2020) presents with complaint of severe headache onset Wednesday with low back radiating to abdomen, thought to have a kidney stone and went to UC. Patient was found to have a bacterial infection (urine?) and was started on Augmentin. On Friday developed a headache, gradual onset, took APAP and Aleve without relief. Later that evening developed jaw and joint pain, worsening headache with throbbing sensation behind both eyes. Joint pain is intermittent, headache is constant.  No recent tick bites, no recent illness, no sick contacts. Associated with nausea with 1 episode of emesis this morning. Reports left abdominal pain. Denies chest pain, fevers, SHOB. History of anxiety, compliant with Prozac.       Past Medical History:  Diagnosis Date   Allergy    Anxiety    Meningitis    [place] spotted fever     Patient Active Problem List   Diagnosis Date Noted   Meningitis 12/10/2020   History of [place] spotted fever 05/18/2019   Obesity 05/18/2019   Depression, major, single episode, complete remission (HCC) 05/18/2019    Past Surgical History:  Procedure Laterality Date   WISDOM TOOTH EXTRACTION         Family History  Problem Relation Age of Onset   Hypertension Mother    Epilepsy Mother    Seizures Mother    Migraines Father    Alzheimer's disease Father    Epilepsy Sister    Seizures Sister    Ovarian cancer Maternal Grandmother     Social History   Tobacco Use   Smoking status: Never   Smokeless tobacco: Never  Vaping Use   Vaping Use: Never used  Substance Use Topics   Alcohol use: Yes    Alcohol/week:  4.0 standard drinks    Types: 1 Glasses of wine, 3 Standard drinks or equivalent per week    Comment: every couple days    Drug use: Never    Home Medications Prior to Admission medications   Medication Sig Start Date End Date Taking? Authorizing Provider  FLUoxetine (PROZAC) 40 MG capsule TAKE 1 CAPSULE BY MOUTH EVERY DAY 07/31/20   Cannady, Jolene T, NP  fluticasone (FLONASE) 50 MCG/ACT nasal spray Place 2 sprays into both nostrils daily. 05/18/19   [name], NP    Allergies    Patient has no known allergies.  Review of Systems   Review of Systems  Constitutional:  Positive for fatigue. Negative for chills and fever.  HENT:  Negative for congestion and sore throat.   Eyes:  Positive for photophobia and pain.  Respiratory:  Negative for cough and shortness of breath.   Cardiovascular:  Negative for chest pain.  Gastrointestinal:  Positive for abdominal pain, nausea and vomiting. Negative for constipation and diarrhea.  Genitourinary:  Negative for dysuria and frequency.  Musculoskeletal:  Positive for arthralgias, myalgias and neck pain.  Skin:  Negative for rash and wound.  Allergic/Immunologic: Negative for immunocompromised state.  Neurological:  Positive for headaches. Negative for dizziness, weakness and numbness.  Hematological:  Negative for adenopathy.  Psychiatric/Behavioral:  Negative for confusion.   All other systems reviewed and are negative.  Physical Exam Updated Vital Signs BP (!) 149/76   Pulse 75   Temp 98.6 F (37 C)   Resp 17   Ht  (1.778 m)   Wt 102.1 kg   SpO2 96%   BMI 32.28 kg/m   Physical Exam Vitals and nursing note reviewed.  Constitutional:      General: He is not in acute distress.    Appearance: He is well-developed. He is not diaphoretic.  HENT:     Head: Normocephalic and atraumatic.     Mouth/Throat:     Mouth: Mucous membranes are moist.  Eyes:     Extraocular Movements: Extraocular movements intact.     Pupils:  Pupils are equal, round, and reactive to light.  Cardiovascular:     Rate and Rhythm: Normal rate and regular rhythm.     Heart sounds: Normal heart sounds.  Pulmonary:     Effort: Pulmonary effort is normal.     Breath sounds: Normal breath sounds.  Abdominal:     Palpations: Abdomen is soft.     Tenderness: There is abdominal tenderness.  Skin:    General: Skin is warm and dry.     Findings: No rash.  Neurological:     Mental Status: He is alert and oriented to person, place, and time.     GCS: GCS eye subscore is 4. GCS verbal subscore is 5. GCS motor subscore is 6.  Psychiatric:        Behavior: Behavior normal.    ED Results / Procedures / Treatments   Labs (all labs ordered are listed, but only abnormal results are displayed) Labs Reviewed  URINALYSIS, ROUTINE W REFLEX MICROSCOPIC - Abnormal; Notable for the following components:      Result Value   Specific Gravity, Urine >1.046 (*)    Protein, ur TRACE (*)    All other components within normal limits  C-REACTIVE PROTEIN - Abnormal; Notable for the following components:   CRP 1.0 (*)    All other components within normal limits  CSF CELL COUNT WITH DIFFERENTIAL - Abnormal; Notable for the following components:   Color, CSF PINK (*)    Appearance, CSF HAZY (*)    RBC Count, CSF 186 (*)    WBC, CSF 9 (*)    Segmented Neutrophils-CSF 15 (*)    Monocyte-Macrophage-Spinal Fluid 11 (*)    All other components within normal limits  CSF CELL COUNT WITH DIFFERENTIAL - Abnormal; Notable for the following components:   RBC Count, CSF 12 (*)    WBC, CSF 13 (*)    Lymphs, CSF 94 (*)    Monocyte-Macrophage-Spinal Fluid 6 (*)    All other components within normal limits  PROTEIN, CSF - Abnormal; Notable for the following components:   Total  Protein, CSF 64 (*)    All other components within normal limits  RESP PANEL BY RT-PCR (FLU A&B, COVID) ARPGX2  CSF CULTURE W GRAM STAIN  COMPREHENSIVE METABOLIC PANEL  CBC WITH  DIFFERENTIAL/PLATELET  SEDIMENTATION RATE  GLUCOSE, CSF    EKG EKG Interpretation  Date/Time:  Sunday December 10 2020 15:31:19 EDT Ventricular Rate:  80 PR Interval:  156 QRS Duration: 84 QT Interval:  362 QTC Calculation: 417 R Axis:   83 Text Interpretation: Normal sinus rhythm Normal ECG No old tracing to compare Confirmed by Rolan Bucco [phone]) on 12/10/2020 3:59:04 PM  Radiology CT Head Wo Contrast  Result Date: 12/10/2020 CLINICAL DATA:  Headache. EXAM: CT HEAD WITHOUT CONTRAST TECHNIQUE: Contiguous  axial images were obtained from the base of the skull through the vertex without intravenous contrast. COMPARISON:  CT head dated 08/24/2016. FINDINGS: Brain: No evidence of acute infarction, hemorrhage, hydrocephalus, extra-axial collection or mass lesion/mass effect. Vascular: No hyperdense vessel or unexpected calcification. Skull: Normal. Negative for fracture or focal lesion. Sinuses/Orbits: There is bilateral frontal, ethmoid, sphenoid, and maxillary sinus disease. No mastoid effusions. Other: None. IMPRESSION: 1. No acute intracranial process. 2. Pansinusitis. Electronically Signed   By: Romona Curls M.D.   On: 12/10/2020 17:46   CT Abdomen Pelvis W Contrast  Result Date: 12/10/2020 CLINICAL DATA:  Abdominal abscess/infection suspected. Headache. Jaw pain. EXAM: CT ABDOMEN AND PELVIS WITH CONTRAST TECHNIQUE: Multidetector CT imaging of the abdomen and pelvis was performed using the standard protocol following bolus administration of intravenous contrast. CONTRAST:  78mL OMNIPAQUE IOHEXOL 350 MG/ML SOLN COMPARISON:  None. FINDINGS: Lower chest: Minimal atelectasis at both lung bases. Heart size is normal. Hepatobiliary: No focal liver abnormality is seen. No radiopaque gallstones, biliary dilatation, or pericholecystic inflammatory changes. Pancreas: Unremarkable. No pancreatic ductal dilatation or surrounding inflammatory changes. Spleen: Normal in size without focal abnormality.  Adrenals/Urinary Tract: Adrenals are normal. 0.6 centimeters cyst identified in the anterior midpole of the LEFT kidney. No suspicious renal mass. No hydronephrosis. Ureters are unremarkable. The bladder and visualized portion of the urethra are normal. Stomach/Bowel: Stomach and small bowel loops are normal in appearance. The appendix is not well seen. Loops of colon are unremarkable. Moderate stool burden. Vascular/Lymphatic: No significant vascular findings are present. No enlarged abdominal or pelvic lymph nodes. Reproductive: Prostate is unremarkable. Other: Small paraumbilical hernia.  No ascites. Musculoskeletal: No acute or significant osseous findings. IMPRESSION: 1.  No evidence for acute  abnormality. 2. No acute a urinary tract abnormality. 3. Appendix is not well seen. 4. Small paraumbilical hernia incidentally noted. Electronically Signed   By: Norva Pavlov M.D.   On: 12/10/2020 17:42    Procedures .Lumbar Puncture  Date/Time: 12/10/2020 10:29 PM Performed by: Jeannie Fend, PA-C Authorized by: Jeannie Fend, PA-C   Consent:    Consent obtained:  Verbal and written   Consent given by:  Patient   Risks, benefits, and alternatives were discussed: yes     Risks discussed:  Bleeding, infection, pain, repeat procedure, nerve damage and headache   Alternatives discussed:  No treatment Universal protocol:    Procedure explained and questions answered to patient or proxy's satisfaction: yes     Relevant documents present and verified: yes     Test results available: yes     Imaging studies available: yes     Required blood products, implants, devices, and special equipment available: yes     Immediately prior to procedure a time out was called: yes     Site/side marked: yes     Patient identity confirmed:  Verbally with patient and arm band Pre-procedure details:    Procedure purpose:  Diagnostic   Preparation: Patient was prepped and draped in usual sterile fashion    Anesthesia:    Anesthesia method:  Local infiltration   Local anesthetic:  Lidocaine 1% w/o epi Procedure details:    Lumbar space:  L4-L5 interspace   Patient position:  Sitting   Needle gauge:  22   Needle type:  Spinal needle - Quincke tip   Needle length (in):  3.5   Ultrasound guidance: no     Number of attempts:  2   Fluid appearance:  Blood-tinged then clearing  Tubes of fluid:  4   Total volume (ml):  4 Post-procedure details:    Puncture site:  Direct pressure applied and adhesive bandage applied   Procedure completion:  Tolerated well, no immediate complications   Medications Ordered in ED Medications  vancomycin (VANCOCIN) IVPB 1000 mg/200 mL premix (has no administration in time range)  cefTRIAXone (ROCEPHIN) 1 g in sodium chloride 0.9 % 100 mL IVPB (1 g Intravenous New Bag/Given 12/10/20 2207)    Followed by  cefTRIAXone (ROCEPHIN) 1 g in sodium chloride 0.9 % 100 mL IVPB (has no administration in time range)  sodium chloride 0.9 % bolus 1,000 mL (0 mLs Intravenous Stopped 12/10/20 1800)  ondansetron (ZOFRAN) injection 4 mg (4 mg Intravenous Given 12/10/20 1628)  HYDROmorphone (DILAUDID) injection 1 mg (1 mg Intravenous Given 12/10/20 1630)  iohexol (OMNIPAQUE) 350 MG/ML injection 80 mL (80 mLs Intravenous Contrast Given 12/10/20 1704)  HYDROmorphone (DILAUDID) injection 0.5 mg (0.5 mg Intravenous Given 12/10/20 1909)  lidocaine (PF) (XYLOCAINE) 1 % injection (5 mLs  Given 12/10/20 1927)  ondansetron (ZOFRAN) injection 4 mg (4 mg Intravenous Given 12/10/20 1931)  0.9 %  sodium chloride infusion ( Intravenous New Bag/Given 12/10/20 2030)    ED Course  I have reviewed the triage vital signs and the nursing notes.  Pertinent labs & imaging results that were available during my care of the patient were reviewed by me and considered in my medical decision making (see chart for details).  Clinical Course as of 12/10/20 2230  Wynelle Link Dec 10, 2020  [id number] 45 year old male presents  with complaint of headache and abdominal pain as above.  Patient reports headache similar to when he was admitted in 2016 and 2018 with meningitis.  Denies fevers.  Does report neck stiffness. Also found to have generalized abdominal discomfort, was at urgent care earlier when he thought he had a kidney stone and was given Augmentin for unknown abdominal infection. On exam, reports pain and stiffness when attempting chin to chest.  CBC unremarkable with normal white blood cell count, CMP is normal, no electrolyte derangement, normal renal function.  Sed rate is normal, CRP mildly elevated at 1, obtained due to complaint of joint pain.  CT head without acute findings, shows pansinusitis. CT abdomen pelvis without acute findings, appendix not visualized however with generalized abdominal pain, several days of symptoms and normal blood cell count, doubt appendicitis. [LM]  2153 Case discussed with Dr. Fredderick Phenix, ED attending who has seen the patient and agrees with plan for LP due to history of meningitis with headache similar to prior. [LM]  2153 Patient tolerated procedure well, did have nausea following procedure. Headache improved with Dilaudid but does return as medication wears off. [LM]  2153 CSF with elevated protein at 64 Glucose normal at 56. WBC on tube 4 elevated at 14 with increase in lymphocytes and decreased monocytes.  Plan is to admit and cover with antibiotics pending cultures. [LM]  2228 Case discussed with Dr. Toniann Fail with [facility] hospitalist service who accepts for admission. [LM]    Clinical Course User Index [LM] Alden Hipp   MDM Rules/Calculators/A&P                            Final Clinical Impression(s) / ED Diagnoses Final diagnoses:  Intractable headache, unspecified chronicity pattern, unspecified headache type    Rx / DC Orders ED Discharge Orders     None  Jeannie Fend, PA-C 12/10/20 2230    Rolan Bucco, MD 12/10/20 [phone]

## 2020-12-10 NOTE — ED Notes (Signed)
Augustine Radar PA aware of WBC from spinal fluid of 13

## 2020-12-10 NOTE — ED Triage Notes (Signed)
H/a  since Friday and both sides  of jaw hurts saw urgent care on [facility] dx with bacterial infection and started on augmentin and then Friday he started to feel bad

## 2020-12-11 ENCOUNTER — Inpatient Hospital Stay (HOSPITAL_COMMUNITY): Payer: BC Managed Care – PPO

## 2020-12-11 ENCOUNTER — Encounter (HOSPITAL_COMMUNITY): Payer: Self-pay | Admitting: Internal Medicine

## 2020-12-11 DIAGNOSIS — Z8616 Personal history of COVID-19: Secondary | ICD-10-CM | POA: Diagnosis not present

## 2020-12-11 DIAGNOSIS — J324 Chronic pansinusitis: Secondary | ICD-10-CM | POA: Diagnosis present

## 2020-12-11 DIAGNOSIS — Z79899 Other long term (current) drug therapy: Secondary | ICD-10-CM | POA: Diagnosis not present

## 2020-12-11 DIAGNOSIS — Z6832 Body mass index (BMI) 32.0-32.9, adult: Secondary | ICD-10-CM | POA: Diagnosis not present

## 2020-12-11 DIAGNOSIS — E669 Obesity, unspecified: Secondary | ICD-10-CM | POA: Diagnosis present

## 2020-12-11 DIAGNOSIS — H532 Diplopia: Secondary | ICD-10-CM | POA: Diagnosis present

## 2020-12-11 DIAGNOSIS — G030 Nonpyogenic meningitis: Secondary | ICD-10-CM | POA: Diagnosis not present

## 2020-12-11 DIAGNOSIS — Z20822 Contact with and (suspected) exposure to covid-19: Secondary | ICD-10-CM | POA: Diagnosis present

## 2020-12-11 DIAGNOSIS — G039 Meningitis, unspecified: Secondary | ICD-10-CM | POA: Diagnosis present

## 2020-12-11 DIAGNOSIS — R519 Headache, unspecified: Principal | ICD-10-CM

## 2020-12-11 DIAGNOSIS — Z8661 Personal history of infections of the central nervous system: Secondary | ICD-10-CM | POA: Diagnosis not present

## 2020-12-11 DIAGNOSIS — F419 Anxiety disorder, unspecified: Secondary | ICD-10-CM | POA: Diagnosis present

## 2020-12-11 DIAGNOSIS — F32A Depression, unspecified: Secondary | ICD-10-CM | POA: Diagnosis present

## 2020-12-11 LAB — PATHOLOGIST SMEAR REVIEW: Path Review: INCREASED

## 2020-12-11 LAB — HIV ANTIBODY (ROUTINE TESTING W REFLEX): HIV Screen 4th Generation wRfx: NONREACTIVE

## 2020-12-11 LAB — TSH: TSH: 4.44 u[IU]/mL (ref 0.350–4.500)

## 2020-12-11 MED ORDER — METOCLOPRAMIDE HCL 5 MG/ML IJ SOLN
10.0000 mg | Freq: Once | INTRAMUSCULAR | Status: AC
  Administered 2020-12-11: 10 mg via INTRAVENOUS
  Filled 2020-12-11: qty 2

## 2020-12-11 MED ORDER — HYDROMORPHONE HCL 1 MG/ML IJ SOLN
0.5000 mg | Freq: Once | INTRAMUSCULAR | Status: AC
  Administered 2020-12-11: 0.5 mg via INTRAVENOUS
  Filled 2020-12-11: qty 1

## 2020-12-11 MED ORDER — MAGNESIUM SULFATE 2 GM/50ML IV SOLN
2.0000 g | Freq: Two times a day (BID) | INTRAVENOUS | Status: DC
  Administered 2020-12-11 – 2020-12-13 (×5): 2 g via INTRAVENOUS
  Filled 2020-12-11 (×6): qty 50

## 2020-12-11 MED ORDER — MORPHINE SULFATE (PF) 2 MG/ML IV SOLN
2.0000 mg | INTRAVENOUS | Status: DC | PRN
  Administered 2020-12-11 – 2020-12-12 (×4): 2 mg via INTRAVENOUS
  Filled 2020-12-11 (×4): qty 1

## 2020-12-11 MED ORDER — ONDANSETRON HCL 4 MG/2ML IJ SOLN: 4.0000 mg | Freq: Four times a day (QID) | INTRAMUSCULAR | Status: DC | PRN

## 2020-12-11 MED ORDER — POLYETHYLENE GLYCOL 3350 17 G PO PACK: 17.0000 g | PACK | Freq: Every day | ORAL | Status: DC | PRN

## 2020-12-11 MED ORDER — ACETAMINOPHEN 325 MG PO TABS: 650.0000 mg | ORAL_TABLET | Freq: Four times a day (QID) | ORAL | Status: DC | PRN

## 2020-12-11 MED ORDER — LACTATED RINGERS IV SOLN: INTRAVENOUS | Status: DC

## 2020-12-11 MED ORDER — ENOXAPARIN SODIUM 40 MG/0.4ML IJ SOSY
40.0000 mg | PREFILLED_SYRINGE | INTRAMUSCULAR | Status: DC
  Administered 2020-12-11 – 2020-12-12 (×2): 40 mg via SUBCUTANEOUS
  Filled 2020-12-11 (×2): qty 0.4

## 2020-12-11 MED ORDER — SODIUM CHLORIDE 0.9% FLUSH
3.0000 mL | Freq: Two times a day (BID) | INTRAVENOUS | Status: DC
  Administered 2020-12-11 – 2020-12-14 (×7): 3 mL via INTRAVENOUS

## 2020-12-11 MED ORDER — ONDANSETRON HCL 4 MG PO TABS: 4.0000 mg | ORAL_TABLET | Freq: Four times a day (QID) | ORAL | Status: DC | PRN

## 2020-12-11 MED ORDER — HYDROCODONE-ACETAMINOPHEN 5-325 MG PO TABS: 1.0000 | ORAL_TABLET | ORAL | Status: DC | PRN

## 2020-12-11 MED ORDER — GADOBUTROL 1 MMOL/ML IV SOLN
10.0000 mL | Freq: Once | INTRAVENOUS | Status: AC | PRN
  Administered 2020-12-11: 10 mL via INTRAVENOUS

## 2020-12-11 MED ORDER — KETOROLAC TROMETHAMINE 15 MG/ML IJ SOLN
15.0000 mg | Freq: Once | INTRAMUSCULAR | Status: AC
  Administered 2020-12-11: 15 mg via INTRAVENOUS
  Filled 2020-12-11: qty 1

## 2020-12-11 MED ORDER — HYDRALAZINE HCL 20 MG/ML IJ SOLN: 5.0000 mg | INTRAMUSCULAR | Status: DC | PRN

## 2020-12-11 MED ORDER — BISACODYL 5 MG PO TBEC: 5.0000 mg | DELAYED_RELEASE_TABLET | Freq: Every day | ORAL | Status: DC | PRN

## 2020-12-11 MED ORDER — DIPHENHYDRAMINE HCL 25 MG PO CAPS
25.0000 mg | ORAL_CAPSULE | Freq: Four times a day (QID) | ORAL | Status: DC
  Administered 2020-12-11 – 2020-12-12 (×5): 25 mg via ORAL
  Filled 2020-12-11 (×5): qty 1

## 2020-12-11 MED ORDER — DOCUSATE SODIUM 100 MG PO CAPS
100.0000 mg | ORAL_CAPSULE | Freq: Two times a day (BID) | ORAL | Status: DC
  Administered 2020-12-11 – 2020-12-13 (×4): 100 mg via ORAL
  Filled 2020-12-11 (×6): qty 1

## 2020-12-11 MED ORDER — ACETAMINOPHEN 650 MG RE SUPP: 650.0000 mg | Freq: Four times a day (QID) | RECTAL | Status: DC | PRN

## 2020-12-11 MED ORDER — FLUOXETINE HCL 20 MG PO CAPS
40.0000 mg | ORAL_CAPSULE | Freq: Every day | ORAL | Status: DC
  Administered 2020-12-12 – 2020-12-14 (×3): 40 mg via ORAL
  Filled 2020-12-11 (×3): qty 2

## 2020-12-11 MED ORDER — INFLUENZA VAC SPLIT QUAD 0.5 ML IM SUSY: 0.5000 mL | PREFILLED_SYRINGE | INTRAMUSCULAR | Status: DC | PRN

## 2020-12-11 NOTE — H&P (Signed)
History and Physical    Jon Saunders HER:[id number] DOB: 1975/12/11 DOA: 12/10/2020  PCP: Venita Lick, NP Consultants:  None Patient coming from:  Home - lives with wife; NOK: Wife, Jon Saunders, [phone]  Chief Complaint: headache  HPI: Jon Saunders is a 45 y.o. male with medical history significant of RMSF-associated meningitis (2018) presenting with headache.  He was seen at [facility] last Wednesday and given Augmentin for apparent urinary infection.   He has had 6 doses of antibiotics.  On Friday, he developed headache behind his eyes.  The pain progressed through the weekend and he went to [facility] yesterday and they recommended he go to the ER.  This pain was similar to when he had viral meningitis in the past.  No significant nasal congestion, but CT showed pansinusitis.  +photophobia/phonophobia.  He has discomfort in his eyes and it is uncomfortable to keep them open.  Last COVID booster was 1 week ago.    ED Course: [facility] transfer, per Dr. Hal Hope:  45 year old male with prior history of meningitis few years ago and [facility] spotted fever disease presents to the ER with 3 days of headache and lumbar puncture shows elevated WBC count and protein and admitted for meningitis.  Started on empiric antibiotics.   Review of Systems: As per HPI; otherwise review of systems reviewed and negative.   Ambulatory Status:  Ambulates without assistance  COVID Vaccine Status:  Complete plus booster  Past Medical History:  Diagnosis Date   Allergy    Anxiety    Meningitis    [place] spotted fever     Past Surgical History:  Procedure Laterality Date   WISDOM TOOTH EXTRACTION      Social History   Socioeconomic History   Marital status: Married    Spouse name: Not on file   Number of children: Not on file   Years of education: Not on file   Highest education level: Not on file  Occupational History   Occupation: sales rep  Tobacco Use   Smoking status:  Never   Smokeless tobacco: Never  Vaping Use   Vaping Use: Never used  Substance and Sexual Activity   Alcohol use: Yes    Alcohol/week: 4.0 standard drinks    Types: 1 Glasses of wine, 3 Standard drinks or equivalent per week    Comment: every couple days    Drug use: Never   Sexual activity: Yes  Other Topics Concern   Not on file  Social History Narrative   Not on file   Social Determinants of Health   Financial Resource Strain: Not on file  Food Insecurity: Not on file  Transportation Needs: Not on file  Physical Activity: Not on file  Stress: Not on file  Social Connections: Not on file  Intimate Partner Violence: Not on file    No Known Allergies  Family History  Problem Relation Age of Onset   Hypertension Mother    Epilepsy Mother    Seizures Mother    Migraines Father    Alzheimer's disease Father    Epilepsy Sister    Seizures Sister    Ovarian cancer Maternal Grandmother     Prior to Admission medications   Medication Sig Start Date End Date Taking? Authorizing Provider  acetaminophen (TYLENOL) 325 MG tablet Take 650 mg by mouth every 6 (six) hours as needed for mild pain or fever.   Yes [provider]  amoxicillin-clavulanate (AUGMENTIN) 875-125 MG tablet Take 1 tablet  by mouth 2 (two) times daily. 12/07/20  Yes [provider]  FLUoxetine (PROZAC) 40 MG capsule TAKE 1 CAPSULE BY MOUTH EVERY DAY 07/31/20  Yes Cannady, Jolene T, NP  naproxen sodium (ALEVE) 220 MG tablet Take 440 mg by mouth 2 (two) times daily as needed (headach/pain).   Yes [provider]  oxymetazoline (AFRIN) 0.05 % nasal spray Place 1 spray into both nostrils 2 (two) times daily as needed for congestion.   Yes [provider]    Physical Exam: Vitals:   12/11/20 0600 12/11/20 0615 12/11/20 0817 12/11/20 1134  BP: 98/60  119/87 122/79  Pulse: 70  68 65  Resp: 18  14 18   Temp:   98.2 F (36.8 C) 97.7 F (36.5 C)  TempSrc:   Oral Oral  SpO2:  90% 94% 94%   Weight:      Height:         General:  Appears calm and comfortable and is in NAD Eyes:  EOMI, normal lids, iris; +ocular TTP with gentle pressure ENT:  grossly normal hearing, lips & tongue, mmm; appropriate dentition; no sinus TTP Neck:  no LAD, masses or thyromegaly Cardiovascular:  RRR, no m/r/g. No LE edema.  Respiratory:   CTA bilaterally with no wheezes/rales/rhonchi.  Normal respiratory effort. Abdomen:  soft, NT, ND Skin:  no rash or induration seen on limited exam Musculoskeletal:  grossly normal tone BUE/BLE, good ROM, no bony abnormality Psychiatric:  grossly normal mood and affect, speech fluent and appropriate, AOx3 Neurologic:  CN 2-12 grossly intact, moves all extremities in coordinated fashion    Radiological Exams on Admission: Independently reviewed - see discussion in A/P where applicable  CT Head Wo Contrast  Result Date: 12/10/2020 CLINICAL DATA:  Headache. EXAM: CT HEAD WITHOUT CONTRAST TECHNIQUE: Contiguous axial images were obtained from the base of the skull through the vertex without intravenous contrast. COMPARISON:  CT head dated 08/24/2016. FINDINGS: Brain: No evidence of acute infarction, hemorrhage, hydrocephalus, extra-axial collection or mass lesion/mass effect. Vascular: No hyperdense vessel or unexpected calcification. Skull: Normal. Negative for fracture or focal lesion. Sinuses/Orbits: There is bilateral frontal, ethmoid, sphenoid, and maxillary sinus disease. No mastoid effusions. Other: None. IMPRESSION: 1. No acute intracranial process. 2. Pansinusitis. Electronically Signed   By: Zerita Boers M.D.   On: 12/10/2020 17:46   MR BRAIN W WO CONTRAST  Result Date: 12/11/2020 CLINICAL DATA:  History of meningitis.  Headache and neck pain. EXAM: MRI HEAD WITHOUT AND WITH CONTRAST MRV HEAD WITHOUT and with CONTRAST TECHNIQUE: Multiplanar, multiecho pulse sequences of the brain and surrounding structures were obtained without and with  intravenous contrast. Angiographic images of the intracranial venous structures were obtained using MRV technique without intravenous contrast. CONTRAST:  39m GADAVIST GADOBUTROL 1 MMOL/ML IV SOLN COMPARISON:  None. FINDINGS: Diffusion imaging does not show any acute or subacute infarction. The brainstem and cerebellum are normal. Cerebral hemispheres are normal. No evidence of old or acute small vessel infarction, mass lesion, hemorrhage, hydrocephalus or extra-axial collection. After contrast administration, no abnormal enhancement occurs. No sign of leptomeningeal enhancement to suggest meningitis by imaging. There is mucosal inflammatory disease throughout the paranasal sinuses. The orbits appear normal. MR venography is normal. Superior sagittal sinus is patent. Transverse sinuses are patent. Sigmoid sinuses are patent. Both jugular veins are patent. Deep venous system appears normal. No discernible superficial thrombosis. IMPRESSION: Normal MRI of the brain. No evidence of parenchymal pathology. No imaging evidence of meningitis. The patient does have inflammatory disease of  the paranasal sinuses which could relate to headache. Normal intracranial MR venography. Electronically Signed   By: Nelson Chimes M.D.   On: 12/11/2020 15:40   CT Abdomen Pelvis W Contrast  Result Date: 12/10/2020 CLINICAL DATA:  Abdominal abscess/infection suspected. Headache. Jaw pain. EXAM: CT ABDOMEN AND PELVIS WITH CONTRAST TECHNIQUE: Multidetector CT imaging of the abdomen and pelvis was performed using the standard protocol following bolus administration of intravenous contrast. CONTRAST:  74m OMNIPAQUE IOHEXOL 350 MG/ML SOLN COMPARISON:  None. FINDINGS: Lower chest: Minimal atelectasis at both lung bases. Heart size is normal. Hepatobiliary: No focal liver abnormality is seen. No radiopaque gallstones, biliary dilatation, or pericholecystic inflammatory changes. Pancreas: Unremarkable. No pancreatic ductal dilatation or  surrounding inflammatory changes. Spleen: Normal in size without focal abnormality. Adrenals/Urinary Tract: Adrenals are normal. 0.6 centimeters cyst identified in the anterior midpole of the LEFT kidney. No suspicious renal mass. No hydronephrosis. Ureters are unremarkable. The bladder and visualized portion of the urethra are normal. Stomach/Bowel: Stomach and small bowel loops are normal in appearance. The appendix is not well seen. Loops of colon are unremarkable. Moderate stool burden. Vascular/Lymphatic: No significant vascular findings are present. No enlarged abdominal or pelvic lymph nodes. Reproductive: Prostate is unremarkable. Other: Small paraumbilical hernia.  No ascites. Musculoskeletal: No acute or significant osseous findings. IMPRESSION: 1.  No evidence for acute  abnormality. 2. No acute a urinary tract abnormality. 3. Appendix is not well seen. 4. Small paraumbilical hernia incidentally noted. Electronically Signed   By: ENolon NationsM.D.   On: 12/10/2020 17:42   MR MRV HEAD W WO CONTRAST  Result Date: 12/11/2020 CLINICAL DATA:  History of meningitis.  Headache and neck pain. EXAM: MRI HEAD WITHOUT AND WITH CONTRAST MRV HEAD WITHOUT and with CONTRAST TECHNIQUE: Multiplanar, multiecho pulse sequences of the brain and surrounding structures were obtained without and with intravenous contrast. Angiographic images of the intracranial venous structures were obtained using MRV technique without intravenous contrast. CONTRAST:  151mGADAVIST GADOBUTROL 1 MMOL/ML IV SOLN COMPARISON:  None. FINDINGS: Diffusion imaging does not show any acute or subacute infarction. The brainstem and cerebellum are normal. Cerebral hemispheres are normal. No evidence of old or acute small vessel infarction, mass lesion, hemorrhage, hydrocephalus or extra-axial collection. After contrast administration, no abnormal enhancement occurs. No sign of leptomeningeal enhancement to suggest meningitis by imaging. There is  mucosal inflammatory disease throughout the paranasal sinuses. The orbits appear normal. MR venography is normal. Superior sagittal sinus is patent. Transverse sinuses are patent. Sigmoid sinuses are patent. Both jugular veins are patent. Deep venous system appears normal. No discernible superficial thrombosis. IMPRESSION: Normal MRI of the brain. No evidence of parenchymal pathology. No imaging evidence of meningitis. The patient does have inflammatory disease of the paranasal sinuses which could relate to headache. Normal intracranial MR venography. Electronically Signed   By: MaNelson Chimes.D.   On: 12/11/2020 15:40    EKG: Independently reviewed.  NSR with rate 80; no evidence of acute ischemia   Labs on Admission: I have personally reviewed the available labs and imaging studies at the time of the admission.  Pertinent labs:   Normal CMP CRP 1.0 ESR 1 Normal CBC COVID/flu negative UA WNL LP glucose 56, protein 64, RBC 186 -> 12, WBC 9 -> 13, 94% lymphs   Assessment/Plan Principal Problem:   Headache behind the eyes Active Problems:   Obesity   Depression with anxiety   Headache -Patient with h/o viral meningitis with RMSF + in 2018 -Also with  equivocal CSF result prior -Now with severe headache behind his eyes with photophobia/phonophobia, reports similar to prior headaches associated with meningitis -LP performed - not overly concerning for meningitis -Migraine is a consideration, but also other issues such as ocular issue, cavernous venous sinus thrombosis -Discussed with Dr. Linus Salmons: -Really no indication to continue antibiotics, not bacterial meningitis -Does not appear to be infectious based on LP results -Low suspicion for encephalitis so no antivirals either -Neurology will see - could be Mollaret's which is a recurrent lymphocytic meningitis that is often associated with HSV, so this has been added to LP -Will admit to Med Tele and follow -MRI with/without contrast  ordered -Consider ophthalmology consult if ongoing concerns -Unlikely related to prior COVID booster which occurred 1 week ago -He was treated with Augmentin x 6 doses for ?UTI prior to presentation -CT with pansinusitis, no apparent symptoms  Anxiety/depression -Continue Prozac  Obesity -Body mass index is 32.28 kg/m..  -Weight loss should be encouraged -Outpatient PCP/bariatric medicine f/u encouraged       Note: This patient has been tested and is negative for the novel coronavirus COVID-19. The patient has been fully vaccinated against COVID-19.   Level of care: Telemetry Medical DVT prophylaxis:  Lovenox  Code Status:  Full - confirmed with patient/family Family Communication: Wife present throughout evaluation Disposition Plan:  The patient is from: home  Anticipated d/c is to: home without [facility] services   Anticipated d/c date will depend on clinical response to treatment, maybe 1-2 days  Patient is currently: acutely ill Consults called: Neurology; ID by telephone only  Admission status:  Admit - It is my clinical opinion that admission to INPATIENT is reasonable and necessary because of the expectation that this patient will require hospital care that crosses at least 2 midnights to treat this condition based on the medical complexity of the problems presented.  Given the aforementioned information, the predictability of an adverse outcome is felt to be significant.    Karmen Bongo MD [facility]   How to contact the [facility] Attending or Consulting provider [place] or covering provider during after hours [place], for this patient?  Check the care team in [facility] and look for a) attending/consulting [facility] provider listed and b) the [facility] team listed Log into [email or web address] and use [place]'s universal password to access. If you do not have the password, please contact the hospital operator. Locate the [facility] provider you are looking for under [facility] and page to a  number that you can be directly reached. If you still have difficulty reaching the provider, please page the [facility] (Director on Call) for the Hospitalists listed on amion for assistance.   12/11/2020, 6:15 PM

## 2020-12-11 NOTE — Consult Note (Addendum)
Neurology Consultation  Reason for Consult: Concern for meningitis Referring Physician: Dr. Lorin Mercy  CC: Throbbing, constant headache   History is obtained from: Chart review, Patient  HPI: Jon Saunders is a 45 y.o. male with a medical history significant for meningitis in 2016 with an inconclusive LP that was thought to have started as sinusitis, meningitis in 2018 from [place] Fever, and recent COVID infection in June of 2022 who presented to [facility] on 12/10/2020 for evaluation of severe headache since 12/08/2020 with jaw and joint pain and progressive headache severity throughout the day that began to throb behind both of his eyes. Headache is associated with photophobia, pain with eye movements, visual disturbances with flashing lights with eyes closed, phonophobia, with nausea and one episode of emesis on 10/2 prior to presentation of the ED. Patient states that this headache is similar to the headache that he felt in 2018 when he had meningitis secondary to [facility] spotted fever. He states at that time he felt like his brain was being pushed to the top of the skull and he had experienced visual loss in his right eye but now his eyes feel like they are being pushed forward out of his head. After his meningitis associated with RMSF in 2018, patient followed with an outpatient ENT and found the patient had a fungal infection within his sinuses and also required treatment with antifungal medications.  He states that he also had a balloon sinuplasty due to recurrent sinus infections and has not had one since that procedure. At OSH, an LP was performed with lymphocytic predominant pleocytosis and an elevated protein and patient was transferred to [facility] for further evaluation and treatment of suspected meningitis.   Patient states that he received his COVID booster on Monday 9/26.  He was also seen at urgent care on 9/28 with lower back pain that radiated to his abdomen  and a urinalysis was obtained concerning for bacterial urinary tract infection for which she was started on Augmentin.  On assessment patient had just returned from MRI and was initially complaining of a headache that was 8/10 in severity that improved to a 2/10 following morphine administration.    On additional questioning with attending physician, he notes he had severe back pain that is atypical and was unprovoked in the lower lumbar spine starting about 2 weeks ago increasing in intensity/frequency, exacerbated by movement and improved with rest.  This led him to present to urgent care for concern for possible kidney stone on Thursday, at which time he was started on Augmentin, which he has been taking twice daily until his presentation to the ED yesterday.  He additionally noted left thumb and index finger pain lasting a few hours on Thursday followed by left knee pain on Saturday and Sunday as well as left rib pain.  All of this pain has been intermittent lasting minutes to hours and self resolving.  He has additionally had some cervical spine pain which she associates with some chronic neck pain and trying to keep his head still to avoid exacerbating his headache.  He specifically denies any loss of vision, color desaturation, visual acuity issues although he notes eye pain and difficulty focusing secondary to his headache.  He notes his temperature normally runs on the low side at 96 and he feels it has been a bit high at 98 lately but denies any diaphoresis though he has had chills.  ROS: A complete ROS was performed and is negative except  as noted in the HPI.   Past Medical History:  Diagnosis Date   Allergy    Anxiety    Meningitis    [place] spotted fever    Past Surgical History:  Procedure Laterality Date   WISDOM TOOTH EXTRACTION     Family History  Problem Relation Age of Onset   Hypertension Mother    Epilepsy Mother    Seizures Mother    Migraines Father     Alzheimer's disease Father    Epilepsy Sister    Seizures Sister    Ovarian cancer Maternal Grandmother    Social History:   reports that he has never smoked. He has never used smokeless tobacco. He reports current alcohol use of about 4.0 standard drinks per week. He reports that he does not use drugs.  Medications  Current Facility-Administered Medications:    acetaminophen (TYLENOL) tablet 650 mg, 650 mg, Oral, Q6H PRN **OR** acetaminophen (TYLENOL) suppository 650 mg, 650 mg, Rectal, Q6H PRN, Karmen Bongo, MD   bisacodyl (DULCOLAX) EC tablet 5 mg, 5 mg, Oral, Daily PRN, Karmen Bongo, MD   docusate sodium (COLACE) capsule 100 mg, 100 mg, Oral, BID, Karmen Bongo, MD   enoxaparin (LOVENOX) injection 40 mg, 40 mg, Subcutaneous, Q24H, Karmen Bongo, MD   Derrill Memo ON 12/12/2020] FLUoxetine (PROZAC) capsule 40 mg, 40 mg, Oral, Daily, Karmen Bongo, MD   hydrALAZINE (APRESOLINE) injection 5 mg, 5 mg, Intravenous, Q4H PRN, Karmen Bongo, MD   HYDROcodone-acetaminophen (NORCO/VICODIN) 5-325 MG per tablet 1-2 tablet, 1-2 tablet, Oral, Q4H PRN, Karmen Bongo, MD   influenza vac split quadrivalent PF (FLUARIX) injection 0.5 mL, 0.5 mL, Intramuscular, Prior to discharge, Rise Patience, MD   lactated ringers infusion, , Intravenous, Continuous, Karmen Bongo, MD   morphine 2 MG/ML injection 2 mg, 2 mg, Intravenous, Q2H PRN, Karmen Bongo, MD   ondansetron [facility]) tablet 4 mg, 4 mg, Oral, Q6H PRN **OR** ondansetron (ZOFRAN) injection 4 mg, 4 mg, Intravenous, Q6H PRN, Karmen Bongo, MD   polyethylene glycol (MIRALAX / GLYCOLAX) packet 17 g, 17 g, Oral, Daily PRN, Karmen Bongo, MD   sodium chloride flush (NS) 0.9 % injection 3 mL, 3 mL, Intravenous, Q12H, Karmen Bongo, MD  Exam: Current vital signs: BP 122/79 (BP Location: Left Arm)   Pulse 65   Temp 97.7 F (36.5 C) (Oral)   Resp 18   Ht 5' 10"  (1.778 m)   Wt 102.1 kg   SpO2 94%   BMI 32.28 kg/m  Vital signs  in last 24 hours: Temp:  [97.7 F (36.5 C)-98.6 F (37 C)] 97.7 F (36.5 C) (10/03 1134) Pulse Rate:  [65-84] 65 (10/03 1134) Resp:  [14-20] 18 (10/03 1134) BP: (98-149)/(60-88) 122/79 (10/03 1134) SpO2:  [90 %-98 %] 94 % (10/03 0817) Weight:  [102.1 kg] 102.1 kg (10/02 1528)  GENERAL: Awake, alert, in no acute distress Psych: Affect appropriate for situation, patient is calm and cooperative with examination Head: Normocephalic and atraumatic, without obvious abnormality EENT: Normal conjunctivae, dry mucous membranes, no OP obstruction LUNGS: Normal respiratory effort. Non-labored breathing on room air CV: Regular rate and rhythm on telemetry ABDOMEN: Soft, non-tender, non-distended Extremities: warm, well perfused, without obvious deformity  NEURO:  Mental Status: Awake, alert, and oriented to person, place, time, and situation. He is able to provide a clear and coherent history of present illness. Speech/Language: speech is intact without dysarthria Naming, repetition, fluency, and comprehension intact without aphasia  No neglect is noted Cranial Nerves:  II: PERRL 3  mm/brisk. Visual fields full.  III, IV, VI: EOMI with pain and forced gaze throughout.  There is no ptosis, gaze preference, or nystagmus. V: Sensation is intact to light touch and symmetrical to face.  VII: Face is symmetric resting and smiling.   VIII: Hearing is intact to voice IX, X: Palate elevation is symmetric. Phonation normal.  XI: Normal sternocleidomastoid and trapezius muscle strength XII: Tongue protrudes midline without fasciculations.   Motor: 5/5 strength is all muscle groups without vertical drift.  Tone is normal. Bulk is normal.  Sensation: Intact to light touch bilaterally in all four extremities. No extinction to DSS present.  Coordination: FTN intact bilaterally. HKS intact bilaterally.  DTRs: 3+ and symmetric patellae, 2+ and symmetric biceps and brachioradialis Gait: Deferred  Labs I  have reviewed labs in epic and the results pertinent to this consultation are: CBC    Component Value Date/Time   WBC 7.8 12/10/2020 1534   RBC 5.39 12/10/2020 1534   HGB 16.3 12/10/2020 1534   HGB 15.0 07/05/2019 1541   HCT 48.9 12/10/2020 1534   HCT 45.6 07/05/2019 1541   PLT 216 12/10/2020 1534   PLT 218 07/05/2019 1541   MCV 90.7 12/10/2020 1534   MCV 90 07/05/2019 1541   MCH 30.2 12/10/2020 1534   MCHC 33.3 12/10/2020 1534   RDW 13.2 12/10/2020 1534   RDW 13.1 07/05/2019 1541   LYMPHSABS 1.8 12/10/2020 1534   LYMPHSABS 3.3 (H) 07/05/2019 1541   MONOABS 0.6 12/10/2020 1534   EOSABS 0.5 12/10/2020 1534   EOSABS 0.2 07/05/2019 1541   BASOSABS 0.1 12/10/2020 1534   BASOSABS 0.0 07/05/2019 1541   CMP     Component Value Date/Time   NA 139 12/10/2020 1534   NA 138 07/05/2019 1541   K 3.9 12/10/2020 1534   CL 103 12/10/2020 1534   CO2 29 12/10/2020 1534   GLUCOSE 85 12/10/2020 1534   BUN 14 12/10/2020 1534   BUN 16 07/05/2019 1541   CREATININE 1.00 12/10/2020 1534   CREATININE 0.88 01/05/2015 1429   CALCIUM 9.4 12/10/2020 1534   PROT 7.6 12/10/2020 1534   PROT 6.8 07/05/2019 1541   ALBUMIN 4.5 12/10/2020 1534   ALBUMIN 4.3 07/05/2019 1541   AST 22 12/10/2020 1534   ALT 24 12/10/2020 1534   ALKPHOS 66 12/10/2020 1534   BILITOT 0.5 12/10/2020 1534   BILITOT 0.4 07/05/2019 1541   GFRNONAA >60 12/10/2020 1534   GFRAA 94 07/05/2019 1541   Lipid Panel     Component Value Date/Time   CHOL 169 07/05/2019 1541   TRIG 186 (H) 07/05/2019 1541   HDL 51 07/05/2019 1541   LDLCALC 86 07/05/2019 1541    Ref. Range 12/10/2020 19:22 12/10/2020 19:22  Appearance, CSF Latest Ref Range: CLEAR  HAZY (A) CLEAR  Glucose, CSF Latest Ref Range: 40 - 70 mg/dL 56   RBC Count, CSF Latest Ref Range: 0 /cu mm 186 (H) 12 (H)  WBC, CSF Latest Ref Range: 0 - 5 /cu mm 9 (H) 13 (HH)  Segmented Neutrophils-CSF Latest Ref Range: 0 - 6 % 15 (H) 0  Lymphs, CSF Latest Ref Range: 40 - 80 % 73 94  (H)  Monocyte-Macrophage-Spinal Fluid Latest Ref Range: 15 - 45 % 11 (L) 6 (L)  Eosinophils, CSF Latest Ref Range: 0 - 1 % 1 0  Color, CSF Latest Ref Range: COLORLESS  PINK (A) COLORLESS  Supernatant Unknown COLORLESS NOT INDICATED  Total  Protein, CSF Latest Ref Range: 15 -  45 mg/dL 64 (H)   Tube # Unknown 1 4   Gram Stain WBC PRESENT, PREDOMINANTLY MONONUCLEAR  NO ORGANISMS SEEN  CYTOSPIN SMEAR   Culture NO GROWTH < 12 HOURS   Path Review Increased mononuclear cells.   Urinalysis    Component Value Date/Time   COLORURINE YELLOW 12/10/2020 2025   APPEARANCEUR CLEAR 12/10/2020 2025   LABSPEC >1.046 (H) 12/10/2020 2025   PHURINE 6.0 12/10/2020 2025   GLUCOSEU NEGATIVE 12/10/2020 2025   HGBUR NEGATIVE 12/10/2020 2025   BILIRUBINUR NEGATIVE 12/10/2020 2025   KETONESUR NEGATIVE 12/10/2020 2025   PROTEINUR TRACE (A) 12/10/2020 2025   NITRITE NEGATIVE 12/10/2020 2025   LEUKOCYTESUR NEGATIVE 12/10/2020 2025   Lab Results  Component Value Date   CRP 1.0 (H) 12/10/2020   Erythrocyte Sedimentation Rate     Component Value Date/Time   ESRSEDRATE 1 12/10/2020 1633   Lab Results  Component Value Date   TSH 2.300 07/05/2019     Imaging I have reviewed the images obtained:  CT-scan of the brain 12/10/2020: 1. No acute intracranial process. 2. Pansinusitis.  MRI examination of the brain with and without contrast, MRV Head with and without contrast 12/11/2020:  Normal MRI of the brain. No evidence of parenchymal pathology. No imaging evidence of meningitis. The patient does have inflammatory disease of the paranasal sinuses which could relate to headache. Normal intracranial MR venography.  Assessment: 44 year old male with history of meningitis in 2016, 2018 thought to be secondary to sinusitis, [facility] ([facility]-Rh) spotted Fever respectively with fungal sinus infection identified by outpatient ENT status post treatment who presents to the ED for evaluation of retro-orbital headache that  is constant, throbbing, associated with photophobia, phonophobia, nausea, vomiting, eye pain with movement concerning for recurrence of an infectious/inflammatory process especially given preceding joint pain and rashes. - Examination reveals patient with ongoing headache without active visual disturbance, eye pain with movement, and photophobia. - LP obtained at [facility] concerning for elevated protein and a lymphocytic predominant pleocytosis with elevated neutrophils and a normal glucose. - MRI imaging was without parenchymal pathology, or imaging evidence of meningitis.  MRI does show inflammatory disease of the paranasal sinuses. MR venogram was normal. - Patient currently reporting signs and symptoms concerning for elevated ICP (diplopia on right lateral gaze on attending evaluation and headache worsened by laying patient flat, improved with sitting patient up) - Differential diagnosis of inflammatory CSF and patient presentation is concerning for aseptic meningitis: Autoimmune disease such as sarcoidosis, SLE or RA to be considered given history of rashes and joint pain (atypical for Mollaret's) Recurrent infection such as Lyme or RMSF (less likely, concerned prior diagnoses may be false positives based on prior ID evaluation) Mollaret's meningitis (diagnosis of exclusion, less likely given systemic symptom of prior rashes and migrating arthralgias currently) Provoked headache in the setting of pansinusitis, though with CSF findings, and lack of typical sinus symptoms that this seems less likely  Recommendations: - HSV PCR added to CSF labs - Follow up on CSF culture, HSV PCR - Lyme and [facility] spotted fever testing - CT chest to evaluate for possible sarcoidosis - Rheumatological screening: ANA, dsDNA, ANCA, anti-Smith, C3, C4, urine protein creatinine ratio, rheumatoid factor, anti-CCP, thyroid antibodies, thyroid peroxidase antibodies  CRP, ESR, 1 and < 1 respectively (near  normal) - If the spot urine protein-to-creatinine ratio is above 0.05 g/mmol, then a 24-hour urine collection will be obtained to better quantify proteinuria - We will hold on empiric treatment for now but low  threshold to start doxycycline and acyclovir overnight if patient becomes febrile - If patient continues to have headache on 10/4 we will repeat lumbar puncture for opening pressure and consider additional CSF studies - Hold NSAIDs/Tylenol overnight monitor fever curve, but we will not use opiates more than 1 to 2 days to avoid opiate rebound headaches  -Okay to restart these medications if he does develop a fever  Pt seen by NP/Neuro and later by MD. Note/plan to be edited by MD as needed.  Anibal Henderson, AGAC-NP [facility] Pager: [phone]

## 2020-12-11 NOTE — ED Notes (Signed)
Report given to Michael with [facility] 

## 2020-12-11 NOTE — Plan of Care (Signed)
  Problem: Education: Goal: Knowledge of General Education information will improve Description Including pain rating scale, medication(s)/side effects and non-pharmacologic comfort measures Outcome: Progressing   

## 2020-12-12 LAB — BASIC METABOLIC PANEL
Anion gap: 6 (ref 5–15)
BUN: 11 mg/dL (ref 6–20)
CO2: 26 mmol/L (ref 22–32)
Calcium: 8.7 mg/dL — ABNORMAL LOW (ref 8.9–10.3)
Chloride: 104 mmol/L (ref 98–111)
Creatinine, Ser: 1.03 mg/dL (ref 0.61–1.24)
GFR, Estimated: >60 mL/min (ref >60)
Glucose, Bld: 91 mg/dL (ref 70–99)
Potassium: 3.6 mmol/L (ref 3.5–5.1)
Sodium: 136 mmol/L (ref 135–145)

## 2020-12-12 LAB — PROTEIN / CREATININE RATIO, URINE
Creatinine, Urine: 214.14 mg/dL
Protein Creatinine Ratio: 0.03 mg/mg{Cre} (ref 0.00–0.15)
Total Protein, Urine: 7 mg/dL

## 2020-12-12 LAB — CBC
HCT: 42.7 % (ref 39.0–52.0)
Hemoglobin: 14.1 g/dL (ref 13.0–17.0)
MCH: 30.2 pg (ref 26.0–34.0)
MCHC: 33 g/dL (ref 30.0–36.0)
MCV: 91.4 fL (ref 80.0–100.0)
Platelets: 187 10*3/uL (ref 150–400)
RBC: 4.67 MIL/uL (ref 4.22–5.81)
RDW: 12.8 % (ref 11.5–15.5)
WBC: 8.9 10*3/uL (ref 4.0–10.5)
nRBC: 0 % (ref 0.0–0.2)

## 2020-12-12 LAB — RPR: RPR Ser Ql: NONREACTIVE

## 2020-12-12 MED ORDER — DIPHENHYDRAMINE HCL 25 MG PO CAPS
25.0000 mg | ORAL_CAPSULE | Freq: Two times a day (BID) | ORAL | Status: DC
  Administered 2020-12-13 (×2): 25 mg via ORAL
  Filled 2020-12-12 (×2): qty 1

## 2020-12-12 MED ORDER — ACETAMINOPHEN 325 MG PO TABS
650.0000 mg | ORAL_TABLET | Freq: Four times a day (QID) | ORAL | Status: DC
  Administered 2020-12-12 – 2020-12-13 (×4): 650 mg via ORAL
  Filled 2020-12-12 (×4): qty 2

## 2020-12-12 MED ORDER — IBUPROFEN 600 MG PO TABS
600.0000 mg | ORAL_TABLET | Freq: Three times a day (TID) | ORAL | Status: DC
  Administered 2020-12-12 – 2020-12-13 (×4): 600 mg via ORAL
  Filled 2020-12-12 (×4): qty 1

## 2020-12-12 NOTE — Plan of Care (Signed)

## 2020-12-12 NOTE — Progress Notes (Signed)
Neurology Progress Note  Brief HPI: 45 y.o. 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 Headache is worsened by laying flat and bending over with diplopia on right lateral gaze and improved with patient sitting up concerning for increased ICP. He also complained of various pains including severe unprovoked lumbar spine back pain that is exacerbated by movement and improved with rest s/p Augmentin Rx with concern for UTI and left knee and left rib pain on Saturday and Sunday. LP was performed at [facility] with concern for recurrence of an infectious/inflammatory process given preceding joint pain and rash that revealed an elevated protein, lymphocytic predominant pleocytosis with elevated neurophil count and a normal glucose without a reported opening pressure.   Subjective: No acute overnight events Patient has remained afebrile CT chest is without imaging stigmata suggestive of sarcoidosis in the thorax On assessment, patient states that his headache is barely noticeable at a 1/10 with last dose of pain medication (Morphine) at 22:13 last night. He states that his headache improved with laying flat and increased when he was sitting up to take medications this morning. Throughout assessment, he does state that the pain does somewhat return as he wakes up and begins to move about. Complains of persistent pain with eye movement and diplopia in right lateral gaze. Denies nausea or continual visual disturbances of flashing light sensation with eyes close.  Patient does clarify that he has not had a rash to his knowledge during his most recent presentation and states that  he did have jaw pain when he presented to the outside ER but it has since resolved. Continues to have significant joint pain On MD evaluation in the evening, notes resolution of his headache with being still and exacerbation with any movement regardless of what position he is in Denies dry mouth/eyes  Exam: Current vital signs: BP 112/75 (BP Location: Left Arm)   Pulse 69   Temp 98.7 F (37.1 C) (Oral)   Resp 16   Ht 5\' 10"  (1.778 m)   Wt 102.1 kg   SpO2 97%   BMI 32.28 kg/m  Vital signs in last 24 hours: Temp:  [98.3 F (36.8 C)-98.7 F (37.1 C)] 98.7 F (37.1 C) (10/04 2042) Pulse Rate:  [69-72] 69 (10/04 2042) Resp:  [16-18] 16 (10/04 2042) BP: (112-116)/(66-75) 112/75 (10/04 2042) SpO2:  [95 %-97 %] 97 % (10/04 2042)   Gen: Asleep initially, laying comfortably in bed, in no acute distress Resp: non-labored breathing, no respiratory distress Abd: soft, non-tender, non-distended  Neuro: Mental Status: Awake, alert, and oriented x 4. He is able to provide a clear and coherent history of present illness, speech is intact without dysarthria, he follows commands without difficulty.  There is no evidence of aphasia or neglect.  Cranial Nerves: PERRL, VFF, EOMI with pain in forced gaze throughout and diplopia in forced right gaze, facial sensation is intact and symmetric to light touch, face is symmetric resting and smiling, hearing is intact to voice, palate elevates symmetrically, phonation is normal, tongue protrudes midline Motor: 5/5 strength present throughout without vertical drift. Tone and bulk are normal. Sensory:Intact to light touch bilaterally in all four extremities. DTR: 3+ and symmetric patellae, 2+ and  symmetric biceps and brachioradialis Gait: Deferred  Pertinent Labs: CBC    Component Value Date/Time   WBC 8.9 12/12/2020 0219   RBC 4.67 12/12/2020 0219   HGB 14.1 12/12/2020 0219   HGB 15.0 07/05/2019 1541   HCT 42.7 12/12/2020 0219   HCT 45.6  07/05/2019 1541   PLT 187 12/12/2020 0219   PLT 218 07/05/2019 1541   MCV 91.4 12/12/2020 0219   MCV 90 07/05/2019 1541   MCH 30.2 12/12/2020 0219   MCHC 33.0 12/12/2020 0219   RDW 12.8 12/12/2020 0219   RDW 13.1 07/05/2019 1541   LYMPHSABS 1.8 12/10/2020 1534   LYMPHSABS 3.3 (H) 07/05/2019 1541   MONOABS 0.6 12/10/2020 1534   EOSABS 0.5 12/10/2020 1534   EOSABS 0.2 07/05/2019 1541   BASOSABS 0.1 12/10/2020 1534   BASOSABS 0.0 07/05/2019 1541   CMP     Component Value Date/Time   NA 136 12/12/2020 0219   NA 138 07/05/2019 1541   K 3.6 12/12/2020 0219   CL 104 12/12/2020 0219   CO2 26 12/12/2020 0219   GLUCOSE 91 12/12/2020 0219   BUN 11 12/12/2020 0219   BUN 16 07/05/2019 1541   CREATININE 1.03 12/12/2020 0219   CREATININE 0.88 01/05/2015 1429   CALCIUM 8.7 (L) 12/12/2020 0219   PROT 7.6 12/10/2020 1534   PROT 6.8 07/05/2019 1541   ALBUMIN 4.5 12/10/2020 1534   ALBUMIN 4.3 07/05/2019 1541   AST 22 12/10/2020 1534   ALT 24 12/10/2020 1534   ALKPHOS 66 12/10/2020 1534   BILITOT 0.5 12/10/2020 1534   BILITOT 0.4 07/05/2019 1541   GFRNONAA >60 12/12/2020 0219   GFRAA 94 07/05/2019 1541    Ref. Range 12/10/2020 19:22 12/10/2020 19:22  Appearance, CSF Latest Ref Range: CLEAR  HAZY (A) CLEAR  Glucose, CSF Latest Ref Range: 40 - 70 mg/dL 56    RBC Count, CSF Latest Ref Range: 0 /cu mm 186 (H) 12 (H)  WBC, CSF Latest Ref Range: 0 - 5 /cu mm 9 (H) 13 (HH)  Segmented Neutrophils-CSF Latest Ref Range: 0 - 6 % 15 (H) 0  Lymphs, CSF Latest Ref Range: 40 - 80 % 73 94 (H)  Monocyte-Macrophage-Spinal Fluid Latest Ref Range: 15 - 45 % 11 (L) 6 (L)  Eosinophils, CSF Latest Ref Range: 0 - 1 % 1 0  Color, CSF Latest Ref Range: COLORLESS  PINK (A) COLORLESS  Supernatant Unknown COLORLESS NOT INDICATED  Total  Protein, CSF Latest Ref Range: 15 - 45 mg/dL 64 (H)    Tube # Unknown 1 4   Protein Creatinine Ratio 0.00 - 0.15 mg/mg 0.03    Unresulted Labs (From admission, onward)      Start     Ordered   12/12/20 0751  Sjogrens syndrome-A extractable nuclear antibody  (Sjogren's Syndrome Antibods (SSA + SSB) (PNL))  Add-on,   AD        12/12/20 0750   12/12/20 0751  Sjogrens syndrome-B extractable nuclear antibody  (Sjogren's Syndrome Antibods (SSA + SSB) (PNL))  Add-on,   AD        12/12/20 0750   12/12/20 0750  IgG 1, 2, 3, and 4  Add-on,   AD        12/12/20 0749   12/11/20 2030  RPR  Once,   R        12/11/20 2029   12/11/20 2014  CYCLIC CITRUL PEPTIDE ANTIBODY, IGG/IGA  Once,   R  12/11/20 2021   12/11/20 2012  Anti-Smith antibody  Once,   R        12/11/20 2021   12/11/20 2010  Thyroid antibodies  Once,   R        12/11/20 2021   12/11/20 2010  Anti-DNA antibody, double-stranded  Once,   R        12/11/20 2021   12/11/20 2010  Antiphospholipid syndrome eval, bld  Once,   R        12/11/20 2021   12/11/20 2010  C3 complement  Once,   R        12/11/20 2021   12/11/20 2010  C4 complement  Once,   R        12/11/20 2021   12/11/20 2005  ANCA Profile  Once,   R        12/11/20 2021   12/11/20 2002  Rheumatoid factor  Once,   R        12/11/20 2021   12/11/20 1959  ANA w/Reflex if Positive  Once,   R        12/11/20 2021   12/11/20 1958  Lyme Disease Serology w/Reflex  Once,   R        12/11/20 2021   12/11/20 1958  Rocky mtn spotted fvr abs pnl(IgG+IgM)  Once,   R        12/11/20 2021   12/11/20 1800  HSV 1/2 PCR, CSF  Once,   R        12/11/20 1800           Imaging Reviewed:  CT Chest 12/12/2020 Personally reviewed by attending MD 1. No imaging stigmata suggestive of sarcoidosis in the thorax. 2. Scattered areas of scarring and/or subsegmental atelectasis in the lungs bilaterally, as above.   MRI examination of the brain with and without contrast, MRV Head with and without contrast 12/11/2020:  Normal MRI of the brain. No evidence of parenchymal pathology. No imaging evidence of meningitis. The patient does have inflammatory disease of the  paranasal sinuses which could relate to headache. Normal intracranial MR venography.  Assessment: 45 y.o. male with  PMHx of meningitis in 2016, 2018 thought to be 2/2 to sinusitis and RMSF respectively with fungal sinus infection identified by outpatient ENT s/p treatment who presents to the ED for evaluation of retro-orbital headache that is constant, throbbing, associated with photophobia, phonophobia, nausea, vomiting, eye pain with movement concerning for recurrence of an infectious/inflammatory process especially given preceding joint pain and rashes. - Examination reveals patient with ongoing headache with diplopia in right lateral gaze (potentially consistent with a cranial nerve VI palsy), eye pain with movement, and photophobia. Headache today is described as worse with movement and improved with laying still consistent with meningeal irritation, which appears consistent based on observation of the patient during examination by attending. - LP obtained at [facility] concerning for elevated protein and a lymphocytic predominant pleocytosis with elevated neutrophils and a normal glucose. - MRI imaging was without parenchymal pathology, or imaging evidence of meningitis.  MRI does show inflammatory disease of the paranasal sinuses. MR venogram was normal. - DDx of inflammatory CSF and patient presentation is concerning for aseptic meningitis: Autoimmune disease such as sarcoidosis, SLE or RA to be considered given history of rashes and joint pain (atypical for Mollaret's), notably patient does not meet typical syndromic criteria Recurrent infection such as Lyme or RMSF (less likely, concerned prior diagnoses may be false positives based on  prior ID evaluation) Mollaret's meningitis (diagnosis of exclusion, less likely given systemic symptom of prior rashes and migrating arthralgias currently) Provoked headache in the setting of pansinusitis, though with CSF findings, and lack of typical sinus  symptoms that this seems less likely Consider etiologies such as IgG4 disease/Tol[place]-Hunt syndrome although again patient does satisfy the typical constellation of findings in these syndromes (for example for CSF in Tol[place]-Hunt is classically fairly bland)  Recommendations: - Follow up on pending labs as above - Starting scheduled Tylenol 650 mg every 6 hours and ibuprofen 800 mg every 8 hours, reducing Benadryl to every 12 hours for symptomatic relief now that patient is confirmed to be afebrile off of fever reducing medication - MRI orbits to evaluate more sensitively for optic nerve enhancement given persistent eye pain particularly with eye movements - We will reach out to ENT for consideration of biopsy 10/5 AM; subsequently will consider empiric steroids if he remains symptomatic    Lanae Boast, AGACNP-BC [facility] [phone]  Attending Neurologist's note:  I personally saw this patient, gathering history, performing a full neurologic examination, reviewing relevant labs, personally reviewing relevant imaging including CT chest, and formulated the assessment and plan, adding the note above for completeness and clarity to accurately reflect my thoughts  Brooke Dare MD-PhD [facility] [phone] Available 7 AM to 7 PM, outside these hours please contact Neurologist on call listed on AMION

## 2020-12-12 NOTE — Progress Notes (Signed)
PROGRESS NOTE    Jon Saunders  QQV:[id number] DOB: [birth year] DOA: 12/10/2020 PCP: [name], NP   Brief Narrative:  HPI: Jon Saunders is a 45 y.o. male with medical history significant of RMSF-associated meningitis (2018) presenting with headache.  He was seen at [facility] last Wednesday and given Augmentin for apparent urinary infection.   He has had 6 doses of antibiotics.  On Friday, he developed headache behind his eyes.  The pain progressed through the weekend and he went to [facility] yesterday and they recommended he go to the ER.  This pain was similar to when he had viral meningitis in the past.  No significant nasal congestion, but CT showed pansinusitis.  +photophobia/phonophobia.  He has discomfort in his eyes and it is uncomfortable to keep them open.  Last COVID booster was 1 week ago.       ED Course: [facility] transfer, per Dr. Toniann Fail:   45 year old male with prior history of meningitis few years ago and [facility] spotted fever disease presents to the ER with 3 days of headache and lumbar puncture shows elevated WBC count and protein and admitted for meningitis.  Started on empiric antibiotics.    Assessment & Plan:   Principal Problem:   Headache behind the eyes Active Problems:   Obesity   Depression with anxiety  Headache: Patient with h/o viral meningitis with RMSF + in 2018. Also with equivocal CSF result prior. Now with severe headache behind his eyes with photophobia/phonophobia, reports similar to prior headaches associated with meningitis. LP performed - not overly concerning for meningitis -Discussed with 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  Patient states that he feels better than yesterday but still has headache behind his eyes bilaterally but improved compared to yesterday.  No focal deficit.  MRI brain as well as MRV head with and without contrast unremarkable.  CT head today once again unremarkable.  Just pansinusitis.  Awaiting neurology to provide further guidance and manage from here.  Multiple labs pending    Anxiety/depression -Continue Prozac   Obesity -Body mass index is 32.28 kg/m.  Exercise and weight loss encouraged. -Weight loss should be encouraged  DVT prophylaxis: enoxaparin (LOVENOX) injection 40 mg Start: 12/11/20 2200   Code Status: Full Code  Family Communication: Wife present at bedside.  Plan of care discussed with patient in length and he verbalized understanding and agreed with it.  Status is: Inpatient  Remains inpatient appropriate because:Ongoing diagnostic testing needed not appropriate for outpatient work up  Dispo: The patient is from: Home              Anticipated d/c is to: Home              Patient currently is not medically stable to d/c.   Difficult to place patient No        Estimated body mass index is 32.28 kg/m as calculated from the following:   Height as of this encounter: 5\' 10"  (1.778 m).   Weight as of this encounter: 102.1 kg.     Nutritional Assessment: Body mass index is 32.28 kg/m. Seen by dietician.  I agree with the assessment and plan as outlined below:  Nutrition Status:        .  Skin Assessment: I have examined the patient's skin and I agree with the wound assessment as performed by the wound care RN as outlined below:    Consultants:  Neurology  Procedures:  None  Antimicrobials:  Anti-infectives (From admission, onward)    Start     Dose/Rate Route Frequency Ordered Stop   12/10/20 2230  cefTRIAXone (ROCEPHIN) 1 g in sodium chloride 0.9 % 100 mL IVPB       See Hyperspace for full Linked Orders Report.   1 g 200 mL/hr over 30  Minutes Intravenous  Once 12/10/20 2156 12/10/20 2330   12/10/20 2200  cefTRIAXone (ROCEPHIN) 1 g in sodium chloride 0.9 % 100 mL IVPB       See Hyperspace for full Linked Orders Report.   1 g 200 mL/hr over 30 Minutes Intravenous  Once 12/10/20 2156 12/10/20 2242   12/10/20 2145  cefTRIAXone (ROCEPHIN) 2 g in sodium chloride 0.9 % 100 mL IVPB  Status:  Discontinued        2 g 200 mL/hr over 30 Minutes Intravenous  Once 12/10/20 2142 12/10/20 2156   12/10/20 2145  vancomycin (VANCOCIN) IVPB 1000 mg/200 mL premix        1,000 mg 200 mL/hr over 60 Minutes Intravenous  Once 12/10/20 2142 12/11/20 0046          Subjective: Patient seen and examined.  Wife at the bedside.  Still complains of retro-orbital pain bilateral but improved compared to yesterday.  Worse with eye movement.  Objective: Vitals:   12/11/20 1134 12/11/20 2036 12/12/20 0626 12/12/20 1328  BP: 122/79 (!) 145/96 114/66 116/67  Pulse: 65 91  72  Resp: 18 17 16 18   Temp: 97.7 F (36.5 C) 98.5 F (36.9 C) 98.6 F (37 C) 98.3 F (36.8 C)  TempSrc: Oral Oral Oral Oral  SpO2:  96% 95% 96%  Weight:      Height:        Intake/Output Summary (Last 24 hours) at 12/12/2020 1526 Last data filed at 12/12/2020 02/11/2021 Gross per 24 hour  Intake 725 ml  Output 150 ml  Net 575 ml   Filed Weights   12/10/20 1528  Weight: 102.1 kg    Examination:  General exam: Appears calm and comfortable  Respiratory system: Clear to auscultation. Respiratory effort normal. Cardiovascular system: S1 & S2 heard, RRR. No JVD, murmurs, rubs, gallops or clicks. No pedal edema. Gastrointestinal system: Abdomen is nondistended, soft and nontender. No organomegaly or masses felt. Normal bowel sounds heard. Central nervous system: Alert and oriented. No focal neurological deficits. Extremities: Symmetric 5 x 5 power. Skin: No rashes, lesions or ulcers Psychiatry: Judgement and insight appear normal. Mood & affect appropriate.    Data  Reviewed: I have personally reviewed following labs and imaging studies  CBC: Recent Labs  Lab 12/10/20 1534 12/12/20 0219  WBC 7.8 8.9  NEUTROABS 4.8  --   HGB 16.3 14.1  HCT 48.9 42.7  MCV 90.7 91.4  PLT 216 187   Basic Metabolic Panel: Recent Labs  Lab 12/10/20 1534 12/12/20 0219  NA 139 136  K 3.9 3.6  CL 103 104  CO2 29 26  GLUCOSE 85 91  BUN 14 11  CREATININE 1.00 1.03  CALCIUM 9.4 8.7*   GFR: Estimated Creatinine Clearance: 110.7 mL/min (by C-G formula based on SCr of 1.03 mg/dL). Liver Function Tests: Recent Labs  Lab 12/10/20 1534  AST 22  ALT 24  ALKPHOS 66  BILITOT 0.5  PROT 7.6  ALBUMIN 4.5   No results for input(s): LIPASE, AMYLASE in the last 168 hours. No results for input(s): AMMONIA in the last 168 hours. Coagulation Profile: No results for input(s): INR, PROTIME in the last 168 hours. Cardiac Enzymes: No results for input(s): CKTOTAL, CKMB, CKMBINDEX, TROPONINI in the last 168 hours. BNP (last 3 results) No results for input(s): PROBNP in the last 8760 hours. HbA1C: No results for input(s): HGBA1C in the last 72 hours. CBG: No results for input(s): GLUCAP in the last 168 hours. Lipid Profile: No results for input(s): CHOL, HDL, LDLCALC, TRIG, CHOLHDL, LDLDIRECT in the last 72 hours. Thyroid Function Tests: Recent Labs    12/11/20 2125  TSH 4.440   Anemia Panel: No results for input(s): VITAMINB12, FOLATE, FERRITIN, TIBC, IRON, RETICCTPCT in the last 72 hours. Sepsis Labs: No results for input(s): PROCALCITON, LATICACIDVEN in the last 168 hours.  Recent Results (from the past 240 hour(s))  Resp Panel by RT-PCR (Flu A&B, Covid) Nasopharyngeal Swab     Status: None   Collection Time: 12/10/20  3:38 PM   Specimen: Nasopharyngeal Swab; Nasopharyngeal(NP) swabs in vial transport medium  Result Value Ref Range Status   SARS Coronavirus 2 by RT PCR NEGATIVE NEGATIVE Final    Comment: (NOTE) SARS-CoV-2 target nucleic acids are NOT  DETECTED.  The SARS-CoV-2 RNA is generally detectable in upper respiratory specimens during the acute phase of infection. The lowest concentration of SARS-CoV-2 viral copies this assay can detect is 138 copies/mL. A negative result does not preclude SARS-Cov-2 infection and should not be used as the sole basis for treatment or other patient management decisions. A negative result may occur with  improper specimen collection/handling, submission of specimen other than nasopharyngeal swab, presence of viral mutation(s) within the areas targeted by this assay, and inadequate number of viral copies(<138 copies/mL). A negative result must be combined with clinical observations, patient history, and epidemiological information. The expected result is Negative.  Fact Sheet for Patients:  BloggerCourse.com  Fact Sheet for Healthcare Providers:  SeriousBroker.it  This test is no t yet approved or cleared by the [place] FDA and  has been authorized for detection and/or diagnosis of SARS-CoV-2 by FDA under an Emergency Use Authorization (EUA). This EUA will remain  in effect (meaning this test can be used) for the duration of the COVID-19 declaration under Section 564(b)(1) of the Act, 21 U.S.C.section 360bbb-3(b)(1), unless the authorization is terminated  or revoked sooner.       Influenza A by PCR NEGATIVE NEGATIVE Final   Influenza B by PCR NEGATIVE NEGATIVE Final    Comment: (NOTE) The Xpert Xpress SARS-CoV-2/FLU/RSV plus assay is intended as an aid in the diagnosis of influenza from Nasopharyngeal swab specimens and should not be used as a sole basis for treatment. Nasal washings and aspirates are unacceptable for Xpert Xpress SARS-CoV-2/FLU/RSV testing.  Fact Sheet for Patients: BloggerCourse.com  Fact Sheet for Healthcare Providers: SeriousBroker.it  This test is not yet  approved or cleared by the [place] FDA and has been authorized for detection and/or diagnosis of SARS-CoV-2 by FDA under an Emergency Use Authorization (EUA). This EUA will remain in effect (meaning this test can be used) for the duration of the COVID-19 declaration under Section 564(b)(1) of the Act, 21 U.S.C. section 360bbb-3(b)(1), unless the authorization is terminated or revoked.  Performed at [facility], [address], [place], [place] [address]   CSF culture  Status: None (Preliminary result)   Collection Time: 12/10/20  7:22 PM   Specimen: CSF; Cerebrospinal Fluid  Result Value Ref Range Status   Specimen Description   Final    CSF Performed at [facility], [address], [place], [place] [address]    Special Requests   Final    NONE Performed at [facility], [address] [place], [place] [address]    Gram Stain   Final    WBC PRESENT, PREDOMINANTLY MONONUCLEAR NO ORGANISMS SEEN CYTOSPIN SMEAR    Culture   Final    NO GROWTH 2 DAYS Performed at [facility], [address] [place], [place] [address]    Report Status PENDING  Incomplete      Radiology Studies: CT Head Wo Contrast  Result Date: 12/10/2020 CLINICAL DATA:  Headache. EXAM: CT HEAD WITHOUT CONTRAST TECHNIQUE: Contiguous axial images were obtained from the base of the skull through the vertex without intravenous contrast. COMPARISON:  CT head dated 08/24/2016. FINDINGS: Brain: No evidence of acute infarction, hemorrhage, hydrocephalus, extra-axial collection or mass lesion/mass effect. Vascular: No hyperdense vessel or unexpected calcification. Skull: Normal. Negative for fracture or focal lesion. Sinuses/Orbits: There is bilateral frontal, ethmoid, sphenoid, and maxillary sinus disease. No mastoid effusions. Other: None. IMPRESSION: 1. No acute intracranial process. 2. Pansinusitis. Electronically Signed   By: Romona Curls  M.D.   On: 12/10/2020 17:46   CT CHEST WO CONTRAST  Result Date: 12/12/2020 CLINICAL DATA:  45 year old male with clinical suspicion of sarcoidosis. EXAM: CT CHEST WITHOUT CONTRAST TECHNIQUE: Multidetector CT imaging of the chest was performed following the standard protocol without IV contrast. COMPARISON:  No priors. FINDINGS: Cardiovascular: Heart size is normal. There is no significant pericardial fluid, thickening or pericardial calcification. No atherosclerotic calcifications in the thoracic aorta or the coronary arteries. Separate origin of the left vertebral artery directly off the aortic arch (normal anatomical variant). Mediastinum/Nodes: No pathologically enlarged mediastinal or hilar lymph nodes. Please note that accurate exclusion of hilar adenopathy is limited on noncontrast CT scans. Esophagus is unremarkable in appearance. No axillary lymphadenopathy. Lungs/Pleura: There are some dependent linear opacities in the lower lobes of the lungs bilaterally, and to a lesser extent in the dependent portion of the left upper lobe and inferior segment of the lingula which may reflect areas of subsegmental atelectasis and/or chronic scarring. No acute consolidative airspace disease. No pleural effusions. No suspicious appearing pulmonary nodules or masses are noted. Upper Abdomen: Unremarkable. Musculoskeletal: There are no aggressive appearing lytic or blastic lesions noted in the visualized portions of the skeleton. IMPRESSION: 1. No imaging stigmata suggestive of sarcoidosis in the thorax. 2. Scattered areas of scarring and/or subsegmental atelectasis in the lungs bilaterally, as above. Electronically Signed   By: Trudie Reed M.D.   On: 12/12/2020 08:07   MR BRAIN W WO CONTRAST  Result Date: 12/11/2020 CLINICAL DATA:  History of meningitis.  Headache and neck pain. EXAM: MRI HEAD WITHOUT AND WITH CONTRAST MRV HEAD WITHOUT and with CONTRAST TECHNIQUE: Multiplanar, multiecho pulse sequences of the  brain and surrounding structures were obtained without and with intravenous contrast. Angiographic images of the intracranial venous structures were obtained using MRV technique without intravenous contrast. CONTRAST:  77mL GADAVIST GADOBUTROL 1 MMOL/ML IV SOLN COMPARISON:  None. FINDINGS: Diffusion imaging does not show any acute or subacute infarction. The brainstem and cerebellum are normal. Cerebral hemispheres are normal. No evidence of old or acute small vessel infarction, mass lesion, hemorrhage, hydrocephalus or extra-axial  collection. After contrast administration, no abnormal enhancement occurs. No sign of leptomeningeal enhancement to suggest meningitis by imaging. There is mucosal inflammatory disease throughout the paranasal sinuses. The orbits appear normal. MR venography is normal. Superior sagittal sinus is patent. Transverse sinuses are patent. Sigmoid sinuses are patent. Both jugular veins are patent. Deep venous system appears normal. No discernible superficial thrombosis. IMPRESSION: Normal MRI of the brain. No evidence of parenchymal pathology. No imaging evidence of meningitis. The patient does have inflammatory disease of the paranasal sinuses which could relate to headache. Normal intracranial MR venography. Electronically Signed   By: Paulina Fusi M.D.   On: 12/11/2020 15:40   CT Abdomen Pelvis W Contrast  Result Date: 12/10/2020 CLINICAL DATA:  Abdominal abscess/infection suspected. Headache. Jaw pain. EXAM: CT ABDOMEN AND PELVIS WITH CONTRAST TECHNIQUE: Multidetector CT imaging of the abdomen and pelvis was performed using the standard protocol following bolus administration of intravenous contrast. CONTRAST:  51mL OMNIPAQUE IOHEXOL 350 MG/ML SOLN COMPARISON:  None. FINDINGS: Lower chest: Minimal atelectasis at both lung bases. Heart size is normal. Hepatobiliary: No focal liver abnormality is seen. No radiopaque gallstones, biliary dilatation, or pericholecystic inflammatory changes.  Pancreas: Unremarkable. No pancreatic ductal dilatation or surrounding inflammatory changes. Spleen: Normal in size without focal abnormality. Adrenals/Urinary Tract: Adrenals are normal. 0.6 centimeters cyst identified in the anterior midpole of the LEFT kidney. No suspicious renal mass. No hydronephrosis. Ureters are unremarkable. The bladder and visualized portion of the urethra are normal. Stomach/Bowel: Stomach and small bowel loops are normal in appearance. The appendix is not well seen. Loops of colon are unremarkable. Moderate stool burden. Vascular/Lymphatic: No significant vascular findings are present. No enlarged abdominal or pelvic lymph nodes. Reproductive: Prostate is unremarkable. Other: Small paraumbilical hernia.  No ascites. Musculoskeletal: No acute or significant osseous findings. IMPRESSION: 1.  No evidence for acute  abnormality. 2. No acute a urinary tract abnormality. 3. Appendix is not well seen. 4. Small paraumbilical hernia incidentally noted. Electronically Signed   By: Norva Pavlov M.D.   On: 12/10/2020 17:42   MR MRV HEAD W WO CONTRAST  Result Date: 12/11/2020 CLINICAL DATA:  History of meningitis.  Headache and neck pain. EXAM: MRI HEAD WITHOUT AND WITH CONTRAST MRV HEAD WITHOUT and with CONTRAST TECHNIQUE: Multiplanar, multiecho pulse sequences of the brain and surrounding structures were obtained without and with intravenous contrast. Angiographic images of the intracranial venous structures were obtained using MRV technique without intravenous contrast. CONTRAST:  22mL GADAVIST GADOBUTROL 1 MMOL/ML IV SOLN COMPARISON:  None. FINDINGS: Diffusion imaging does not show any acute or subacute infarction. The brainstem and cerebellum are normal. Cerebral hemispheres are normal. No evidence of old or acute small vessel infarction, mass lesion, hemorrhage, hydrocephalus or extra-axial collection. After contrast administration, no abnormal enhancement occurs. No sign of  leptomeningeal enhancement to suggest meningitis by imaging. There is mucosal inflammatory disease throughout the paranasal sinuses. The orbits appear normal. MR venography is normal. Superior sagittal sinus is patent. Transverse sinuses are patent. Sigmoid sinuses are patent. Both jugular veins are patent. Deep venous system appears normal. No discernible superficial thrombosis. IMPRESSION: Normal MRI of the brain. No evidence of parenchymal pathology. No imaging evidence of meningitis. The patient does have inflammatory disease of the paranasal sinuses which could relate to headache. Normal intracranial MR venography. Electronically Signed   By: Paulina Fusi M.D.   On: 12/11/2020 15:40    Scheduled Meds:  diphenhydrAMINE  25 mg Oral Q6H   docusate sodium  100 mg Oral  BID   enoxaparin (LOVENOX) injection  40 mg Subcutaneous Q24H   FLUoxetine  40 mg Oral Daily   sodium chloride flush  3 mL Intravenous Q12H   Continuous Infusions:  lactated ringers 75 mL/hr at 12/12/20 0807   magnesium sulfate bolus IVPB 2 g (12/12/20 0811)     LOS: 1 day   Time spent: 32 minutes   Hughie Closs, MD [facility]  12/12/2020, 3:26 PM  Please page via Loretha Stapler and do not message via secure chat for anything urgent. Secure chat can be used for anything non urgent.  How to contact the [facility] Attending or Consulting provider 7A - 7P or covering provider during after hours 7P -7A, for this patient?  Check the care team in [facility] and look for a) attending/consulting [facility] provider listed and b) the [facility] team listed. Page or secure chat 7A-7P. Log into [email or web address] and use [place]'s universal password to access. If you do not have the password, please contact the hospital operator. Locate the [facility] provider you are looking for under [facility] and page to a number that you can be directly reached. If you still have difficulty reaching the provider, please page the [facility] (Director on Call) for the Hospitalists  listed on amion for assistance.

## 2020-12-13 ENCOUNTER — Inpatient Hospital Stay (HOSPITAL_COMMUNITY): Payer: BC Managed Care – PPO

## 2020-12-13 LAB — ENA+DNA/DS+ANTICH+CENTRO+JO...
Anti JO-1: <0.2 AI (ref 0.0–0.9)
Centromere Ab Screen: <0.2 AI (ref 0.0–0.9)
Chromatin Ab SerPl-aCnc: <0.2 AI (ref 0.0–0.9)
ENA SM Ab Ser-aCnc: <0.2 AI (ref 0.0–0.9)
Ribonucleic Protein: 1.8 AI — ABNORMAL HIGH (ref 0.0–0.9)
SSA (Ro) (ENA) Antibody, IgG: <0.2 AI (ref 0.0–0.9)
SSB (La) (ENA) Antibody, IgG: <0.2 AI (ref 0.0–0.9)
Scleroderma (Scl-70) (ENA) Antibody, IgG: <0.2 AI (ref 0.0–0.9)
ds DNA Ab: 3 IU/mL (ref 0–9)

## 2020-12-13 LAB — THYROID ANTIBODIES
Thyroglobulin Antibody: <1 IU/mL (ref 0.0–0.9)
Thyroperoxidase Ab SerPl-aCnc: <8 IU/mL (ref 0–34)

## 2020-12-13 LAB — CBC WITH DIFFERENTIAL/PLATELET
Abs Immature Granulocytes: 0.05 10*3/uL (ref 0.00–0.07)
Basophils Absolute: 0.1 10*3/uL (ref 0.0–0.1)
Basophils Relative: 0 %
Eosinophils Absolute: 0.5 10*3/uL (ref 0.0–0.5)
Eosinophils Relative: 4 %
HCT: 44.4 % (ref 39.0–52.0)
Hemoglobin: 15.1 g/dL (ref 13.0–17.0)
Immature Granulocytes: 0 %
Lymphocytes Relative: 27 %
Lymphs Abs: 3.7 10*3/uL (ref 0.7–4.0)
MCH: 30.6 pg (ref 26.0–34.0)
MCHC: 34 g/dL (ref 30.0–36.0)
MCV: 89.9 fL (ref 80.0–100.0)
Monocytes Absolute: 0.8 10*3/uL (ref 0.1–1.0)
Monocytes Relative: 6 %
Neutro Abs: 8.4 10*3/uL — ABNORMAL HIGH (ref 1.7–7.7)
Neutrophils Relative %: 63 %
Platelets: 195 10*3/uL (ref 150–400)
RBC: 4.94 MIL/uL (ref 4.22–5.81)
RDW: 12.6 % (ref 11.5–15.5)
WBC: 13.4 10*3/uL — ABNORMAL HIGH (ref 4.0–10.5)
nRBC: 0 % (ref 0.0–0.2)

## 2020-12-13 LAB — BASIC METABOLIC PANEL
Anion gap: 11 (ref 5–15)
BUN: 14 mg/dL (ref 6–20)
CO2: 21 mmol/L — ABNORMAL LOW (ref 22–32)
Calcium: 8.9 mg/dL (ref 8.9–10.3)
Chloride: 102 mmol/L (ref 98–111)
Creatinine, Ser: 1.01 mg/dL (ref 0.61–1.24)
GFR, Estimated: >60 mL/min (ref >60)
Glucose, Bld: 105 mg/dL — ABNORMAL HIGH (ref 70–99)
Potassium: 3.8 mmol/L (ref 3.5–5.1)
Sodium: 134 mmol/L — ABNORMAL LOW (ref 135–145)

## 2020-12-13 LAB — ANCA PROFILE
Anti-MPO Antibodies: <0.2 units (ref 0.0–0.9)
Anti-PR3 Antibodies: <0.2 units (ref 0.0–0.9)
Atypical P-ANCA titer: <1:20 {titer}
C-ANCA: <1:20 {titer}
P-ANCA: <1:20 {titer}

## 2020-12-13 LAB — ANA W/REFLEX IF POSITIVE: Anti Nuclear Antibody (ANA): POSITIVE — AB

## 2020-12-13 LAB — TROPONIN I (HIGH SENSITIVITY)
Troponin I (High Sensitivity): 3 ng/L (ref <18)
Troponin I (High Sensitivity): 2 ng/L (ref <18)

## 2020-12-13 LAB — ANTI-DNA ANTIBODY, DOUBLE-STRANDED: ds DNA Ab: 3 IU/mL (ref 0–9)

## 2020-12-13 LAB — SJOGRENS SYNDROME-B EXTRACTABLE NUCLEAR ANTIBODY: SSB (La) (ENA) Antibody, IgG: <0.2 AI (ref 0.0–0.9)

## 2020-12-13 LAB — SJOGRENS SYNDROME-A EXTRACTABLE NUCLEAR ANTIBODY: SSA (Ro) (ENA) Antibody, IgG: <0.2 AI (ref 0.0–0.9)

## 2020-12-13 LAB — ANTI-SMITH ANTIBODY: ENA SM Ab Ser-aCnc: <0.2 AI (ref 0.0–0.9)

## 2020-12-13 LAB — LYME DISEASE SEROLOGY W/REFLEX: Lyme Total Antibody EIA: NEGATIVE

## 2020-12-13 MED ORDER — SODIUM CHLORIDE 0.9 % IV SOLN
2.0000 g | Freq: Two times a day (BID) | INTRAVENOUS | Status: DC
  Administered 2020-12-13 – 2020-12-14 (×3): 2 g via INTRAVENOUS
  Filled 2020-12-13 (×3): qty 20

## 2020-12-13 MED ORDER — DIPHENHYDRAMINE HCL 25 MG PO CAPS: 25.0000 mg | ORAL_CAPSULE | Freq: Three times a day (TID) | ORAL | Status: DC | PRN

## 2020-12-13 MED ORDER — GADOBUTROL 1 MMOL/ML IV SOLN
10.0000 mL | Freq: Once | INTRAVENOUS | Status: AC | PRN
  Administered 2020-12-13: 10 mL via INTRAVENOUS

## 2020-12-13 MED ORDER — ACETAMINOPHEN 325 MG PO TABS: 650.0000 mg | ORAL_TABLET | Freq: Four times a day (QID) | ORAL | Status: DC | PRN

## 2020-12-13 MED ORDER — IBUPROFEN 600 MG PO TABS: 600.0000 mg | ORAL_TABLET | Freq: Four times a day (QID) | ORAL | Status: DC | PRN

## 2020-12-13 NOTE — Consult Note (Addendum)
Reason for Consult: Sinusitis Referring Physician: Neurology  Jon Saunders is an 45 y.o. male.  HPI: Jon Saunders is an otherwise healthy 45 year old white male admitted for eye pain and headache with a history of aseptic meningitis.  His work-up is currently underway and thus far there is no explanation for his headache.  He has had sinus problems in the past for which she had balloon sinuplasty and surgery to remove fungal disease.  Currently and over the past several weeks he denies any nosebleeds, chronic nasal crusting, salty rhinorrhea, or salty taste in the back of the mouth.  He denies any history of migraine headaches.  Past Medical History:  Diagnosis Date   Allergy    Anxiety    Meningitis    [place] spotted fever     Past Surgical History:  Procedure Laterality Date   WISDOM TOOTH EXTRACTION      Family History  Problem Relation Age of Onset   Hypertension Mother    Epilepsy Mother    Seizures Mother    Migraines Father    Alzheimer's disease Father    Epilepsy Sister    Seizures Sister    Ovarian cancer Maternal Grandmother     Social History:  reports that he has never smoked. He has never used smokeless tobacco. He reports current alcohol use of about 4.0 standard drinks per week. He reports that he does not use drugs.  Allergies: No Known Allergies  Medications: I have reviewed the patient's current medications.  Results for orders placed or performed during the hospital encounter of 12/10/20 (from the past 48 hour(s))  HIV Antibody (routine testing w rflx)     Status: None   Collection Time: 12/11/20  4:04 PM  Result Value Ref Range   HIV Screen 4th Generation wRfx Non Reactive Non Reactive    Comment: Performed at [facility], [address], [place], [place] [address]  TSH     Status: None   Collection Time: 12/11/20  9:25 PM  Result Value Ref Range   TSH 4.440 0.350 - 4.500 uIU/mL    Comment: Performed by a 3rd Generation assay with a  functional sensitivity of <=0.01 uIU/mL. Performed at [facility], [address] [place], [place] [address]   RPR     Status: None   Collection Time: 12/11/20  9:25 PM  Result Value Ref Range   RPR Ser Ql NON REACTIVE NON REACTIVE    Comment: Performed at [facility], [address] [place], [place] [address]  Basic metabolic panel     Status: Abnormal   Collection Time: 12/12/20  2:19 AM  Result Value Ref Range   Sodium 136 135 - 145 mmol/L   Potassium 3.6 3.5 - 5.1 mmol/L   Chloride 104 98 - 111 mmol/L   CO2 26 22 - 32 mmol/L   Glucose, Bld 91 70 - 99 mg/dL    Comment: Glucose reference range applies only to samples taken after fasting for at least 8 hours.   BUN 11 6 - 20 mg/dL   Creatinine, Ser 2.13 0.61 - 1.24 mg/dL   Calcium 8.7 (L) 8.9 - 10.3 mg/dL   GFR, Estimated >08 >65 mL/min    Comment: (NOTE) Calculated using the CKD-EPI Creatinine Equation (2021)    Anion gap 6 5 - 15    Comment: Performed at [facility], [address], [place], [place] [address]  CBC     Status: None  Collection Time: 12/12/20  2:19 AM  Result Value Ref Range   WBC 8.9 4.0 - 10.5 K/uL   RBC 4.67 4.22 - 5.81 MIL/uL   Hemoglobin 14.1 13.0 - 17.0 g/dL   HCT 78.2 95.6 - 21.3 %   MCV 91.4 80.0 - 100.0 fL   MCH 30.2 26.0 - 34.0 pg   MCHC 33.0 30.0 - 36.0 g/dL   RDW 08.6 57.8 - 46.9 %   Platelets 187 150 - 400 K/uL   nRBC 0.0 0.0 - 0.2 %    Comment: Performed at [facility], [address], [place], [place] [address]  Protein / creatinine ratio, urine     Status: None   Collection Time: 12/12/20  6:39 AM  Result Value Ref Range   Creatinine, Urine 214.14 mg/dL   Total Protein, Urine 7 mg/dL    Comment: NO NORMAL RANGE ESTABLISHED FOR THIS TEST   Protein Creatinine Ratio 0.03 0.00 - 0.15 mg/mg[Cre]    Comment: Performed at [facility], [address], [place], [place] [address]  Basic metabolic panel     Status: Abnormal   Collection Time: 12/13/20  1:11  AM  Result Value Ref Range   Sodium 134 (L) 135 - 145 mmol/L   Potassium 3.8 3.5 - 5.1 mmol/L   Chloride 102 98 - 111 mmol/L   CO2 21 (L) 22 - 32 mmol/L   Glucose, Bld 105 (H) 70 - 99 mg/dL    Comment: Glucose reference range applies only to samples taken after fasting for at least 8 hours.   BUN 14 6 - 20 mg/dL   Creatinine, Ser 4.40 0.61 - 1.24 mg/dL   Calcium 8.9 8.9 - 10.2 mg/dL   GFR, Estimated >72 >53 mL/min    Comment: (NOTE) Calculated using the CKD-EPI Creatinine Equation (2021)    Anion gap 11 5 - 15    Comment: Performed at [facility], [address] [place], [place] [address]  CBC with Differential/Platelet     Status: Abnormal   Collection Time: 12/13/20  1:11 AM  Result Value Ref Range   WBC 13.4 (H) 4.0 - 10.5 K/uL   RBC 4.94 4.22 - 5.81 MIL/uL   Hemoglobin 15.1 13.0 - 17.0 g/dL   HCT 34.7 42.5 - 95.6 %   MCV 89.9 80.0 - 100.0 fL   MCH 30.6 26.0 - 34.0 pg   MCHC 34.0 30.0 - 36.0 g/dL   RDW 38.7 56.4 - 33.2 %   Platelets 195 150 - 400 K/uL   nRBC 0.0 0.0 - 0.2 %   Neutrophils Relative % 63 %   Neutro Abs 8.4 (H) 1.7 - 7.7 K/uL   Lymphocytes Relative 27 %   Lymphs Abs 3.7 0.7 - 4.0 K/uL   Monocytes Relative 6 %   Monocytes Absolute 0.8 0.1 - 1.0 K/uL   Eosinophils Relative 4 %   Eosinophils Absolute 0.5 0.0 - 0.5 K/uL   Basophils Relative 0 %   Basophils Absolute 0.1 0.0 - 0.1 K/uL   Immature Granulocytes 0 %   Abs Immature Granulocytes 0.05 0.00 - 0.07 K/uL    Comment: Performed at [facility], [address] [place], [place] [address]  Troponin I (High Sensitivity)     Status: None   Collection Time: 12/13/20  1:11 AM  Result Value Ref Range   Troponin I (High Sensitivity) 3 <18 ng/L    Comment: (NOTE) Elevated high sensitivity troponin I (hsTnI) values  and significant  changes across serial measurements may suggest ACS but many other  chronic and acute conditions are known to elevate hsTnI results.  Refer to the "Links" section for  chest pain algorithms and additional  guidance. Performed at [facility], [address], [place], [place] [address]   Troponin I (High Sensitivity)     Status: None   Collection Time: 12/13/20  4:02 AM  Result Value Ref Range   Troponin I (High Sensitivity) 2 <18 ng/L    Comment: (NOTE) Elevated high sensitivity troponin I (hsTnI) values and significant  changes across serial measurements may suggest ACS but many other  chronic and acute conditions are known to elevate hsTnI results.  Refer to the "Links" section for chest pain algorithms and additional  guidance. Performed at [facility], [address], [place], [place] [address]     CT CHEST WO CONTRAST  Result Date: 12/12/2020 CLINICAL DATA:  45 year old male with clinical suspicion of sarcoidosis. EXAM: CT CHEST WITHOUT CONTRAST TECHNIQUE: Multidetector CT imaging of the chest was performed following the standard protocol without IV contrast. COMPARISON:  No priors. FINDINGS: Cardiovascular: Heart size is normal. There is no significant pericardial fluid, thickening or pericardial calcification. No atherosclerotic calcifications in the thoracic aorta or the coronary arteries. Separate origin of the left vertebral artery directly off the aortic arch (normal anatomical variant). Mediastinum/Nodes: No pathologically enlarged mediastinal or hilar lymph nodes. Please note that accurate exclusion of hilar adenopathy is limited on noncontrast CT scans. Esophagus is unremarkable in appearance. No axillary lymphadenopathy. Lungs/Pleura: There are some dependent linear opacities in the lower lobes of the lungs bilaterally, and to a lesser extent in the dependent portion of the left upper lobe and inferior segment of the lingula which may reflect areas of subsegmental atelectasis and/or chronic scarring. No acute consolidative airspace disease. No pleural effusions. No suspicious appearing pulmonary nodules or masses are noted. Upper  Abdomen: Unremarkable. Musculoskeletal: There are no aggressive appearing lytic or blastic lesions noted in the visualized portions of the skeleton. IMPRESSION: 1. No imaging stigmata suggestive of sarcoidosis in the thorax. 2. Scattered areas of scarring and/or subsegmental atelectasis in the lungs bilaterally, as above. Electronically Signed   By: Trudie Reed M.D.   On: 12/12/2020 08:07   MR BRAIN W WO CONTRAST  Result Date: 12/11/2020 CLINICAL DATA:  History of meningitis.  Headache and neck pain. EXAM: MRI HEAD WITHOUT AND WITH CONTRAST MRV HEAD WITHOUT and with CONTRAST TECHNIQUE: Multiplanar, multiecho pulse sequences of the brain and surrounding structures were obtained without and with intravenous contrast. Angiographic images of the intracranial venous structures were obtained using MRV technique without intravenous contrast. CONTRAST:  46mL GADAVIST GADOBUTROL 1 MMOL/ML IV SOLN COMPARISON:  None. FINDINGS: Diffusion imaging does not show any acute or subacute infarction. The brainstem and cerebellum are normal. Cerebral hemispheres are normal. No evidence of old or acute small vessel infarction, mass lesion, hemorrhage, hydrocephalus or extra-axial collection. After contrast administration, no abnormal enhancement occurs. No sign of leptomeningeal enhancement to suggest meningitis by imaging. There is mucosal inflammatory disease throughout the paranasal sinuses. The orbits appear normal. MR venography is normal. Superior sagittal sinus is patent. Transverse sinuses are patent. Sigmoid sinuses are patent. Both jugular veins are patent. Deep venous system appears normal. No discernible superficial thrombosis. IMPRESSION: Normal MRI of the brain. No evidence of parenchymal pathology. No imaging evidence of meningitis. The patient does have inflammatory disease of the paranasal sinuses which could relate to  headache. Normal intracranial MR venography. Electronically Signed   By: Paulina Fusi M.D.    On: 12/11/2020 15:40   MR MRV HEAD W WO CONTRAST  Result Date: 12/11/2020 CLINICAL DATA:  History of meningitis.  Headache and neck pain. EXAM: MRI HEAD WITHOUT AND WITH CONTRAST MRV HEAD WITHOUT and with CONTRAST TECHNIQUE: Multiplanar, multiecho pulse sequences of the brain and surrounding structures were obtained without and with intravenous contrast. Angiographic images of the intracranial venous structures were obtained using MRV technique without intravenous contrast. CONTRAST:  74mL GADAVIST GADOBUTROL 1 MMOL/ML IV SOLN COMPARISON:  None. FINDINGS: Diffusion imaging does not show any acute or subacute infarction. The brainstem and cerebellum are normal. Cerebral hemispheres are normal. No evidence of old or acute small vessel infarction, mass lesion, hemorrhage, hydrocephalus or extra-axial collection. After contrast administration, no abnormal enhancement occurs. No sign of leptomeningeal enhancement to suggest meningitis by imaging. There is mucosal inflammatory disease throughout the paranasal sinuses. The orbits appear normal. MR venography is normal. Superior sagittal sinus is patent. Transverse sinuses are patent. Sigmoid sinuses are patent. Both jugular veins are patent. Deep venous system appears normal. No discernible superficial thrombosis. IMPRESSION: Normal MRI of the brain. No evidence of parenchymal pathology. No imaging evidence of meningitis. The patient does have inflammatory disease of the paranasal sinuses which could relate to headache. Normal intracranial MR venography. Electronically Signed   By: Paulina Fusi M.D.   On: 12/11/2020 15:40   MR ORBITS W WO CONTRAST  Result Date: 12/13/2020 CLINICAL DATA:  Initial evaluation for ophthalmoplegia. EXAM: MRI OF THE ORBITS WITHOUT AND WITH CONTRAST TECHNIQUE: Multiplanar, multi-echo pulse sequences of the orbits and surrounding structures were acquired including fat saturation techniques, before and after intravenous contrast  administration. CONTRAST:  12mL GADAVIST GADOBUTROL 1 MMOL/ML IV SOLN COMPARISON:  Prior MRI from 12/11/2020. FINDINGS: Orbits: Globes are symmetric in size with normal morphology and appearance bilaterally. Optic nerves symmetric and normal. No intrinsic optic nerve edema or enhancement to suggest optic neuritis. No abnormality seen about either optic nerve sheath. No abnormality about the orbital apices or cavernous sinus. Optic chiasm normally situated within the suprasellar cistern without abnormality. No sellar or suprasellar mass. Visualized optic radiations normal. Extra-ocular muscles symmetric and within normal limits. Intraconal and extraconal fat well-maintained without evidence for intraorbital or postseptal cellulitis. Lacrimal glands within normal limits. Superior orbital veins symmetric and normal. Visualized sinuses: Sequelae of prior sinus surgery noted. Extensive mucosal thickening seen throughout the right frontal sinus, ethmoidal air cells, right greater than left sphenoid sinuses, and right greater than left maxillary sinuses. No visible evidence for extension of infection across either lamina papyracea. No evidence for intracranial spread of infection. Soft tissues: Preseptal and periorbital soft tissues within normal limits. Limited intracranial: Unremarkable, with no new intracranial abnormality visible. IMPRESSION: 1. Normal MRI of the orbits. 2. Pan sinusitis, overall slightly worse on the right. No MRI evidence for intraorbital or intracranial spread of infection. Electronically Signed   By: Rise Mu M.D.   On: 12/13/2020 05:00    Review of Systems Blood pressure 115/81, pulse 60, temperature 97.9 F (36.6 C), temperature source Oral, resp. rate 18, height 5\' 10"  (1.778 m), weight 102.1 kg, SpO2 95 %.  I reviewed the CT scan films and report.  They comment on pansinusitis however it is mild and largely sparing the frontal sphenoid and maxillary sinuses.  He does have  diffuse disease within the ethmoid region.  Carefully examined the skull base using sagittal cuts and  see no evidence of dehiscence.  There is no radiological evidence of fungal sinus disease on the scan today.  Physical Exam  Alert and oriented x3 pupils equal round and reactive to light with normal gross visual acuity No facial pain No cervical adenopathy Mastoids nontender Careful examination of the nasal cavity revealed pink mucosa without any ulceration, crusting, or abnormal vascularity  Assessment/Plan:  Diffuse chronic ethmoid sinusitis Headache  The disease I see on his CT scan is very unlikely to be the cause for his current admission and chronic headache.  Second, I see nothing that would suggest a skull base dehiscence or communication between the ethmoids and the intracranial space that could lead to meningitis.  A more definitive way to rule this out would be to obtain a radiolabeled CSF study at an academic center.  Again, he would expect patient to have bacterial meningitis if secondary to sinus etiology.  Patient has a history of [facility] spotted fever and I wonder if he may have this again.  While he has no known tick bites, he lives in the same place where he lived in 2018.  I understand his infectious disease work-up is ongoing.  Random biopsies of the sinonasal mucosa in the absence of crusting, vascularity change, or ulceration is very low yield, and I would not recommend it at this time.  If there is any bacterial growth on his CSF cultures or he develops sinonasal symptoms of any kind, we may reconsider going to the operating room for biopsies and surgery.  At this time, sinus biopsy and surgery is not indicated.  Please reconsult if things change and have patient follow-up with his operative ENT upon discharge.  Rejeana Brock 12/13/2020, 9:33 AM    Addendum (12/13/20 at 1248)  I was able to review several additional series of CT cuts.  On the sagittal  series, there is a question of bony widening just anterior to the cribiform plate.  Given the recurrent meningitis, I would proceed with the following and have discussed with neurology and neuroradiology:   Recommend MRI of the brain with and without contrast and cisternogram including T2 weighted coronal cuts using the [place] protocol looking for a CSF leak in the anterior cranial fossa.  2.    I would go ahead and start on antibiotics for the chronic sinusitis.  Until the MRI cisternogram results are back, start IV ceftriaxone 2gQ12h (dosing per neurology) to cover sinus disease and possible meningitis of sinus etiology.  If scan is negative for a leak, OK from sinus treatment standpoint to switch antibiotic to Augmentin x 14 days.  If scan shows a leak, will need 2 weeks of ceftriaxone (or ABX per ID) followed by ENT outpatient referral for surgical repair of the area.  I communicated this plan with Dr. Iver Nestle of neurology.

## 2020-12-13 NOTE — Progress Notes (Addendum)
Pt having chest pressure/discomfort, 3/10 for about 30 mins. Pt previously given pain meds for HA. STAT EKG done and on call MD alerted. Blood work ordered, please see orders. Will reassess.    Pt sat up given a coke and pressure/discomfort has subsided.   Pt left for MRI via bed.

## 2020-12-13 NOTE — Progress Notes (Signed)
PROGRESS NOTE    Jon Saunders  ZOX:[id number] DOB: [birth year] DOA: 12/10/2020 PCP: [name], NP   Brief Narrative:  Jon Saunders is a 45 y.o. male with medical history significant of RMSF-associated meningitis (2018) presenting with headache.  He was seen at [facility] last Wednesday and given Augmentin for apparent urinary infection.   He has had 6 doses of antibiotics.  On Friday, he developed headache behind his eyes.  The pain progressed through the weekend and he went to UC day prior and they recommended he go to the ER.  This pain was similar to when he had viral meningitis in the past.  No significant nasal congestion, but CT showed pansinusitis.  +photophobia/phonophobia.  Last COVID booster was 1 week ago.  Admitted to hospitalist service and neurology consulted.  Assessment & Plan:   Principal Problem:   Headache behind the eyes Active Problems:   Obesity   Depression with anxiety  Headache: Patient with h/o viral meningitis with RMSF + in 2018. Also with equivocal CSF result prior. Now with severe headache behind his eyes with photophobia/phonophobia, reports similar to prior headaches associated with meningitis. LP performed - not overly concerning for meningitis -Discussed with 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   MRI brain as well as MRV head with and without contrast unremarkable.  CT head once again unremarkable.  Just pansinusitis.  MRI orbits bilateral also negative.  Due to pansinusitis, ENT has been consulted.  Per ENT note, no indication for biopsy however per my direct discussion with neurologist, ENT is reconsidering their impression and they are going to  take a look at the scans again with radiology.  Plan progressing.  Neurology to see patient shortly and will explain the plan.  Patient would like for her to be tested for [facility] spotted fever.  No history of tick bite but he has history of [facility] and spotted fever back in 2018 and he is still living in the same place.  Low suspicion but I have ordered those labs as well.      Anxiety/depression -Continue Prozac   Obesity -Body mass index is 32.28 kg/m.  Exercise and weight loss encouraged. -Weight loss should be encouraged  DVT prophylaxis: enoxaparin (LOVENOX) injection 40 mg Start: 12/11/20 2200   Code Status: Full Code  Family Communication: None at bedside.  Plan of care discussed with patient.  Status is: Inpatient  Remains inpatient appropriate because:Ongoing diagnostic testing needed not appropriate for outpatient work up  Dispo: The patient is from: Home              Anticipated d/c is to: Home              Patient currently is not medically stable to d/c.   Difficult to place patient No        Estimated body mass index is 32.28 kg/m as calculated from the following:   Height as of this encounter: 5\' 10"  (1.778 m).   Weight as of this encounter: 102.1 kg.     Nutritional Assessment: Body mass index is 32.28 kg/m. Seen by dietician.  I agree with the assessment and plan as outlined below: Nutrition Status:        .  Skin Assessment: I have examined the patient's skin and I agree with the wound assessment as performed by the wound care RN as outlined below:    Consultants:  Neurology  Procedures:  None  Antimicrobials:  Anti-infectives (From admission, onward)    Start     Dose/Rate Route Frequency Ordered Stop   12/10/20 2230  cefTRIAXone (ROCEPHIN) 1 g in sodium chloride 0.9 % 100 mL IVPB       See Hyperspace for full Linked Orders Report.   1 g 200 mL/hr over 30 Minutes Intravenous  Once 12/10/20 2156 12/10/20 2330   12/10/20  2200  cefTRIAXone (ROCEPHIN) 1 g in sodium chloride 0.9 % 100 mL IVPB       See Hyperspace for full Linked Orders Report.   1 g 200 mL/hr over 30 Minutes Intravenous  Once 12/10/20 2156 12/10/20 2242   12/10/20 2145  cefTRIAXone (ROCEPHIN) 2 g in sodium chloride 0.9 % 100 mL IVPB  Status:  Discontinued        2 g 200 mL/hr over 30 Minutes Intravenous  Once 12/10/20 2142 12/10/20 2156   12/10/20 2145  vancomycin (VANCOCIN) IVPB 1000 mg/200 mL premix        1,000 mg 200 mL/hr over 60 Minutes Intravenous  Once 12/10/20 2142 12/11/20 0046          Subjective: Patient seen and examined.  He states that last night he had worst headache of his life but he was given morphine and Tylenol and ibuprofen and pain resolved within an hour and has not recurred.  He is retro-orbital pain has improved significantly as well.  And he is not having as much photophobia as he did yesterday.  No other complaint.  Objective: Vitals:   12/12/20 0626 12/12/20 1328 12/12/20 2042 12/13/20 0401  BP: 114/66 116/67 112/75 115/81  Pulse:  72 69 60  Resp: 16 18 16 18   Temp: 98.6 F (37 C) 98.3 F (36.8 C) 98.7 F (37.1 C) 97.9 F (36.6 C)  TempSrc: Oral Oral Oral Oral  SpO2: 95% 96% 97% 95%  Weight:      Height:        Intake/Output Summary (Last 24 hours) at 12/13/2020 1225 Last data filed at 12/13/2020 0630 Gross per 24 hour  Intake 3185.78 ml  Output 350 ml  Net 2835.78 ml    Filed Weights   12/10/20 1528  Weight: 102.1 kg    Examination:  General exam: Appears calm and comfortable  Respiratory system: Clear to auscultation. Respiratory effort normal. Cardiovascular system: S1 & S2 heard, RRR. No JVD, murmurs, rubs, gallops or clicks. No pedal edema. Gastrointestinal system: Abdomen is nondistended, soft and nontender. No organomegaly or masses felt. Normal bowel sounds heard. Central nervous system: Alert and oriented. No focal neurological deficits. Extremities: Symmetric 5 x 5  power. Skin: No rashes, lesions or ulcers.  Psychiatry: Judgement and insight appear normal. Mood & affect appropriate.   Data Reviewed: I have personally reviewed following labs and imaging studies  CBC: Recent Labs  Lab 12/10/20 1534 12/12/20 0219 12/13/20 0111  WBC 7.8 8.9 13.4*  NEUTROABS 4.8  --  8.4*  HGB 16.3 14.1 15.1  HCT 48.9 42.7 44.4  MCV 90.7 91.4 89.9  PLT 216 187 195    Basic Metabolic Panel: Recent Labs  Lab 12/10/20 1534 12/12/20 0219 12/13/20 0111  NA 139 136 134*  K 3.9 3.6 3.8  CL 103 104 102  CO2 29 26 21*  GLUCOSE 85 91  105*  BUN 14 11 14   CREATININE 1.00 1.03 1.01  CALCIUM 9.4 8.7* 8.9    GFR: Estimated Creatinine Clearance: 112.8 mL/min (by C-G formula based on SCr of 1.01 mg/dL). Liver Function Tests: Recent Labs  Lab 12/10/20 1534  AST 22  ALT 24  ALKPHOS 66  BILITOT 0.5  PROT 7.6  ALBUMIN 4.5    No results for input(s): LIPASE, AMYLASE in the last 168 hours. No results for input(s): AMMONIA in the last 168 hours. Coagulation Profile: No results for input(s): INR, PROTIME in the last 168 hours. Cardiac Enzymes: No results for input(s): CKTOTAL, CKMB, CKMBINDEX, TROPONINI in the last 168 hours. BNP (last 3 results) No results for input(s): PROBNP in the last 8760 hours. HbA1C: No results for input(s): HGBA1C in the last 72 hours. CBG: No results for input(s): GLUCAP in the last 168 hours. Lipid Profile: No results for input(s): CHOL, HDL, LDLCALC, TRIG, CHOLHDL, LDLDIRECT in the last 72 hours. Thyroid Function Tests: Recent Labs    12/11/20 2125  TSH 4.440    Anemia Panel: No results for input(s): VITAMINB12, FOLATE, FERRITIN, TIBC, IRON, RETICCTPCT in the last 72 hours. Sepsis Labs: No results for input(s): PROCALCITON, LATICACIDVEN in the last 168 hours.  Recent Results (from the past 240 hour(s))  Resp Panel by RT-PCR (Flu A&B, Covid) Nasopharyngeal Swab     Status: None   Collection Time: 12/10/20  3:38 PM    Specimen: Nasopharyngeal Swab; Nasopharyngeal(NP) swabs in vial transport medium  Result Value Ref Range Status   SARS Coronavirus 2 by RT PCR NEGATIVE NEGATIVE Final    Comment: (NOTE) SARS-CoV-2 target nucleic acids are NOT DETECTED.  The SARS-CoV-2 RNA is generally detectable in upper respiratory specimens during the acute phase of infection. The lowest concentration of SARS-CoV-2 viral copies this assay can detect is 138 copies/mL. A negative result does not preclude SARS-Cov-2 infection and should not be used as the sole basis for treatment or other patient management decisions. A negative result may occur with  improper specimen collection/handling, submission of specimen other than nasopharyngeal swab, presence of viral mutation(s) within the areas targeted by this assay, and inadequate number of viral copies(<138 copies/mL). A negative result must be combined with clinical observations, patient history, and epidemiological information. The expected result is Negative.  Fact Sheet for Patients:  02/09/21  Fact Sheet for Healthcare Providers:  BloggerCourse.com  This test is no t yet approved or cleared by the [employer] FDA and  has been authorized for detection and/or diagnosis of SARS-CoV-2 by FDA under an Emergency Use Authorization (EUA). This EUA will remain  in effect (meaning this test can be used) for the duration of the COVID-19 declaration under Section 564(b)(1) of the Act, 21 U.S.C.section 360bbb-3(b)(1), unless the authorization is terminated  or revoked sooner.       Influenza A by PCR NEGATIVE NEGATIVE Final   Influenza B by PCR NEGATIVE NEGATIVE Final    Comment: (NOTE) The Xpert Xpress SARS-CoV-2/FLU/RSV plus assay is intended as an aid in the diagnosis of influenza from Nasopharyngeal swab specimens and should not be used as a sole basis for treatment. Nasal washings and aspirates are  unacceptable for Xpert Xpress SARS-CoV-2/FLU/RSV testing.  Fact Sheet for Patients: [place]  Fact Sheet for Healthcare Providers: BloggerCourse.com  This test is not yet approved or cleared by the [employer] FDA and has been authorized for detection and/or diagnosis of SARS-CoV-2 by FDA under an Emergency Use Authorization (EUA). This EUA will remain  in effect (meaning this test can be used) for the duration of the COVID-19 declaration under Section 564(b)(1) of the Act, 21 U.S.C. section 360bbb-3(b)(1), unless the authorization is terminated or revoked.  Performed at [facility], [address] [place], [place] [address]   CSF culture     Status: None (Preliminary result)   Collection Time: 12/10/20  7:22 PM   Specimen: CSF; Cerebrospinal Fluid  Result Value Ref Range Status   Specimen Description   Final    CSF Performed at [facility], [address] [place], [place] [address]    Special Requests   Final    NONE Performed at [facility], [address], [place], [place] [address]    Gram Stain   Final    WBC PRESENT, PREDOMINANTLY MONONUCLEAR NO ORGANISMS SEEN CYTOSPIN SMEAR    Culture   Final    NO GROWTH 3 DAYS Performed at [facility], [address] [place], [place] [address]    Report Status PENDING  Incomplete       Radiology Studies: CT CHEST WO CONTRAST  Result Date: 12/12/2020 CLINICAL DATA:  45 year old male with clinical suspicion of sarcoidosis. EXAM: CT CHEST WITHOUT CONTRAST TECHNIQUE: Multidetector CT imaging of the chest was performed following the standard protocol without IV contrast. COMPARISON:  No priors. FINDINGS: Cardiovascular: Heart size is normal. There is no significant pericardial fluid, thickening or pericardial calcification. No atherosclerotic calcifications in the thoracic aorta or the coronary  arteries. Separate origin of the left vertebral artery directly off the aortic arch (normal anatomical variant). Mediastinum/Nodes: No pathologically enlarged mediastinal or hilar lymph nodes. Please note that accurate exclusion of hilar adenopathy is limited on noncontrast CT scans. Esophagus is unremarkable in appearance. No axillary lymphadenopathy. Lungs/Pleura: There are some dependent linear opacities in the lower lobes of the lungs bilaterally, and to a lesser extent in the dependent portion of the left upper lobe and inferior segment of the lingula which may reflect areas of subsegmental atelectasis and/or chronic scarring. No acute consolidative airspace disease. No pleural effusions. No suspicious appearing pulmonary nodules or masses are noted. Upper Abdomen: Unremarkable. Musculoskeletal: There are no aggressive appearing lytic or blastic lesions noted in the visualized portions of the skeleton. IMPRESSION: 1. No imaging stigmata suggestive of sarcoidosis in the thorax. 2. Scattered areas of scarring and/or subsegmental atelectasis in the lungs bilaterally, as above. Electronically Signed   By: Trudie Reed M.D.   On: 12/12/2020 08:07   MR BRAIN W WO CONTRAST  Result Date: 12/11/2020 CLINICAL DATA:  History of meningitis.  Headache and neck pain. EXAM: MRI HEAD WITHOUT AND WITH CONTRAST MRV HEAD WITHOUT and with CONTRAST TECHNIQUE: Multiplanar, multiecho pulse sequences of the brain and surrounding structures were obtained without and with intravenous contrast. Angiographic images of the intracranial venous structures were obtained using MRV technique without intravenous contrast. CONTRAST:  10mL GADAVIST GADOBUTROL 1 MMOL/ML IV SOLN COMPARISON:  None. FINDINGS: Diffusion imaging does not show any acute or subacute infarction. The brainstem and cerebellum are normal. Cerebral hemispheres are normal. No evidence of old or acute small vessel infarction, mass lesion, hemorrhage, hydrocephalus or  extra-axial collection. After contrast administration, no abnormal enhancement occurs. No sign of leptomeningeal enhancement to suggest meningitis by imaging. There is mucosal inflammatory disease throughout the paranasal sinuses. The orbits appear normal. MR venography is normal. Superior sagittal sinus is patent. Transverse sinuses are patent. Sigmoid sinuses are patent. Both jugular veins are patent. Deep venous system appears  normal. No discernible superficial thrombosis. IMPRESSION: Normal MRI of the brain. No evidence of parenchymal pathology. No imaging evidence of meningitis. The patient does have inflammatory disease of the paranasal sinuses which could relate to headache. Normal intracranial MR venography. Electronically Signed   By: Paulina Fusi M.D.   On: 12/11/2020 15:40   MR MRV HEAD W WO CONTRAST  Result Date: 12/11/2020 CLINICAL DATA:  History of meningitis.  Headache and neck pain. EXAM: MRI HEAD WITHOUT AND WITH CONTRAST MRV HEAD WITHOUT and with CONTRAST TECHNIQUE: Multiplanar, multiecho pulse sequences of the brain and surrounding structures were obtained without and with intravenous contrast. Angiographic images of the intracranial venous structures were obtained using MRV technique without intravenous contrast. CONTRAST:  12mL GADAVIST GADOBUTROL 1 MMOL/ML IV SOLN COMPARISON:  None. FINDINGS: Diffusion imaging does not show any acute or subacute infarction. The brainstem and cerebellum are normal. Cerebral hemispheres are normal. No evidence of old or acute small vessel infarction, mass lesion, hemorrhage, hydrocephalus or extra-axial collection. After contrast administration, no abnormal enhancement occurs. No sign of leptomeningeal enhancement to suggest meningitis by imaging. There is mucosal inflammatory disease throughout the paranasal sinuses. The orbits appear normal. MR venography is normal. Superior sagittal sinus is patent. Transverse sinuses are patent. Sigmoid sinuses are  patent. Both jugular veins are patent. Deep venous system appears normal. No discernible superficial thrombosis. IMPRESSION: Normal MRI of the brain. No evidence of parenchymal pathology. No imaging evidence of meningitis. The patient does have inflammatory disease of the paranasal sinuses which could relate to headache. Normal intracranial MR venography. Electronically Signed   By: Paulina Fusi M.D.   On: 12/11/2020 15:40   MR ORBITS W WO CONTRAST  Result Date: 12/13/2020 CLINICAL DATA:  Initial evaluation for ophthalmoplegia. EXAM: MRI OF THE ORBITS WITHOUT AND WITH CONTRAST TECHNIQUE: Multiplanar, multi-echo pulse sequences of the orbits and surrounding structures were acquired including fat saturation techniques, before and after intravenous contrast administration. CONTRAST:  90mL GADAVIST GADOBUTROL 1 MMOL/ML IV SOLN COMPARISON:  Prior MRI from 12/11/2020. FINDINGS: Orbits: Globes are symmetric in size with normal morphology and appearance bilaterally. Optic nerves symmetric and normal. No intrinsic optic nerve edema or enhancement to suggest optic neuritis. No abnormality seen about either optic nerve sheath. No abnormality about the orbital apices or cavernous sinus. Optic chiasm normally situated within the suprasellar cistern without abnormality. No sellar or suprasellar mass. Visualized optic radiations normal. Extra-ocular muscles symmetric and within normal limits. Intraconal and extraconal fat well-maintained without evidence for intraorbital or postseptal cellulitis. Lacrimal glands within normal limits. Superior orbital veins symmetric and normal. Visualized sinuses: Sequelae of prior sinus surgery noted. Extensive mucosal thickening seen throughout the right frontal sinus, ethmoidal air cells, right greater than left sphenoid sinuses, and right greater than left maxillary sinuses. No visible evidence for extension of infection across either lamina papyracea. No evidence for intracranial spread  of infection. Soft tissues: Preseptal and periorbital soft tissues within normal limits. Limited intracranial: Unremarkable, with no new intracranial abnormality visible. IMPRESSION: 1. Normal MRI of the orbits. 2. Pan sinusitis, overall slightly worse on the right. No MRI evidence for intraorbital or intracranial spread of infection. Electronically Signed   By: Rise Mu M.D.   On: 12/13/2020 05:00    Scheduled Meds:  acetaminophen  650 mg Oral Q6H   diphenhydrAMINE  25 mg Oral Q12H   docusate sodium  100 mg Oral BID   enoxaparin (LOVENOX) injection  40 mg Subcutaneous Q24H   FLUoxetine  40 mg Oral  Daily   ibuprofen  600 mg Oral Q8H   sodium chloride flush  3 mL Intravenous Q12H   Continuous Infusions:  lactated ringers 75 mL/hr at 12/13/20 0844   magnesium sulfate bolus IVPB 2 g (12/13/20 0942)     LOS: 2 days   Time spent: 30 minutes   Hughie Closs, MD [facility]  12/13/2020, 12:25 PM  Please page via Loretha Stapler and do not message via secure chat for anything urgent. Secure chat can be used for anything non urgent.  How to contact the [facility] Attending or Consulting provider 7A - 7P or covering provider during after hours 7P -7A, for this patient?  Check the care team in [facility] and look for a) attending/consulting [facility] provider listed and b) the [facility] team listed. Page or secure chat 7A-7P. Log into [email or web address] and use [place]'s universal password to access. If you do not have the password, please contact the hospital operator. Locate the [facility] provider you are looking for under [facility] and page to a number that you can be directly reached. If you still have difficulty reaching the provider, please page the [facility] (Director on Call) for the Hospitalists listed on amion for assistance.

## 2020-12-13 NOTE — Progress Notes (Signed)
Neurology Progress Note  Brief HPI: 45 y.o. 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 Headache is worsened by laying flat and bending over with diplopia on right lateral gaze and improved with patient sitting up concerning for increased ICP. He also complained of various pains including severe unprovoked lumbar spine back pain that is exacerbated by movement and improved with rest s/p Augmentin Rx with concern for UTI and left knee and left rib pain on Saturday and Sunday. LP was performed at [facility] with concern for recurrence of an infectious/inflammatory process given preceding joint pain and rash that revealed an elevated protein, lymphocytic predominant pleocytosis with elevated neurophil count and a normal glucose without a reported opening pressure.   Subjective: Headache resolved, diplopia resolved, pain with eye movement resolved since 10/5 AM through the evening when seen by MD  Exam: Current vital signs: BP 115/81 (BP Location: Left Arm)   Pulse 60   Temp 97.9 F (36.6 C) (Oral)   Resp 18   Ht 5\' 10"  (1.778 m)   Wt 102.1 kg   SpO2 95%   BMI 32.28 kg/m  Vital signs in last 24 hours: Temp:  [97.9 F (36.6 C)-98.7 F (37.1 C)] 97.9 F (36.6 C) (10/05 0401) Pulse Rate:  [60-72] 60 (10/05 0401) Resp:  [16-18] 18 (10/05 0401) BP: (112-116)/(67-81) 115/81 (10/05 0401) SpO2:  [95 %-97 %] 95 % (10/05 0401)   Gen: Asleep initially, laying comfortably in bed, in no acute distress Resp: non-labored breathing, no respiratory distress Abd: soft, non-tender, non-distended  Neuro: Mental Status: Awake, alert, and oriented x 4. He is able to  provide a clear and coherent history of present illness, speech is intact without dysarthria, he follows commands without difficulty.  There is no evidence of aphasia or neglect.  Cranial Nerves: PERRL, VFF, EOMI without pain today and without diplopia, facial sensation is intact and symmetric to light touch, face is symmetric resting and smiling, hearing is intact to voice, palate elevates symmetrically, phonation is normal, tongue protrudes midline Motor: Moving all 4 extremities freely and grossly equally  Pertinent Labs: CBC    Component Value Date/Time   WBC 13.4 (H) 12/13/2020 0111   RBC 4.94 12/13/2020 0111   HGB 15.1 12/13/2020 0111   HGB 15.0 07/05/2019 1541   HCT 44.4 12/13/2020 0111   HCT 45.6 07/05/2019 1541   PLT 195 12/13/2020 0111   PLT 218 07/05/2019 1541   MCV 89.9 12/13/2020 0111   MCV 90 07/05/2019 1541   MCH 30.6 12/13/2020 0111   MCHC 34.0 12/13/2020 0111   RDW 12.6 12/13/2020 0111   RDW 13.1 07/05/2019 1541   LYMPHSABS 3.7 12/13/2020 0111   LYMPHSABS 3.3 (H) 07/05/2019 1541   MONOABS 0.8 12/13/2020 0111   EOSABS 0.5 12/13/2020 0111   EOSABS 0.2 07/05/2019 1541   BASOSABS 0.1 12/13/2020 0111   BASOSABS 0.0 07/05/2019 1541   CMP     Component Value Date/Time   NA 134 (L) 12/13/2020 0111   NA 138 07/05/2019 1541   K 3.8 12/13/2020 0111   CL 102 12/13/2020 0111   CO2 21 (L) 12/13/2020 0111   GLUCOSE 105 (H) 12/13/2020 0111   BUN 14 12/13/2020  0111   BUN 16 07/05/2019 1541   CREATININE 1.01 12/13/2020 0111   CREATININE 0.88 01/05/2015 1429   CALCIUM 8.9 12/13/2020 0111   PROT 7.6 12/10/2020 1534   PROT 6.8 07/05/2019 1541   ALBUMIN 4.5 12/10/2020 1534   ALBUMIN 4.3 07/05/2019 1541   AST 22 12/10/2020 1534   ALT 24 12/10/2020 1534   ALKPHOS 66 12/10/2020 1534   BILITOT 0.5 12/10/2020 1534   BILITOT 0.4 07/05/2019 1541   GFRNONAA >60 12/13/2020 0111   GFRAA 94 07/05/2019 1541    Ref. Range 12/10/2020 19:22 12/10/2020 19:22  Appearance, CSF Latest  Ref Range: CLEAR  HAZY (A) CLEAR  Glucose, CSF Latest Ref Range: 40 - 70 mg/dL 56    RBC Count, CSF Latest Ref Range: 0 /cu mm 186 (H) 12 (H)  WBC, CSF Latest Ref Range: 0 - 5 /cu mm 9 (H) 13 (HH)  Segmented Neutrophils-CSF Latest Ref Range: 0 - 6 % 15 (H) 0  Lymphs, CSF Latest Ref Range: 40 - 80 % 73 94 (H)  Monocyte-Macrophage-Spinal Fluid Latest Ref Range: 15 - 45 % 11 (L) 6 (L)  Eosinophils, CSF Latest Ref Range: 0 - 1 % 1 0  Color, CSF Latest Ref Range: COLORLESS  PINK (A) COLORLESS  Supernatant Unknown COLORLESS NOT INDICATED  Total  Protein, CSF Latest Ref Range: 15 - 45 mg/dL 64 (H)    Tube # Unknown 1 4   Protein Creatinine Ratio 0.00 - 0.15 mg/mg 0.03    ANA positive, titer pending RNP antibody positive at 1.8 HIV and RPR negative TSH 4.440, thyroperoxidase and thyroglobulin antibodies negative, anti-Jo 1, centromere AB, dsDNA, c-ANCA, p-ANCA, atypical p-ANCA, ANA, chromatin, MPO, PR-3, ENA, SSA, SSB, scleroderma antibodies all negative   Unresulted Labs (From admission, onward)     Start     Ordered   12/14/20 0500  CBC with Differential/Platelet  Tomorrow morning,   R       Question:  Specimen collection method  Answer:  Lab=Lab collect   12/13/20 1231   12/13/20 0803  Angiotensin converting enzyme  Add-on,   AD       Question:  Specimen collection method  Answer:  Lab=Lab collect   12/13/20 0802   12/12/20 0750  IgG 1, 2, 3, and 4  Add-on,   AD        12/12/20 0749   12/11/20 2014  CYCLIC CITRUL PEPTIDE ANTIBODY, IGG/IGA  Once,   R        12/11/20 2021   12/11/20 2010  Antiphospholipid syndrome eval, bld  Once,   R        12/11/20 2021   12/11/20 2010  C3 complement  Once,   R        12/11/20 2021   12/11/20 2010  C4 complement  Once,   R        12/11/20 2021   12/11/20 2002  Rheumatoid factor  Once,   R        12/11/20 2021   12/11/20 1958  Rocky mtn spotted fvr abs pnl(IgG+IgM)  Once,   R        12/11/20 2021           Imaging Reviewed:  CT Chest  12/12/2020 Personally reviewed by attending MD 1. No imaging stigmata suggestive of sarcoidosis in the thorax. 2. Scattered areas of scarring and/or subsegmental atelectasis in the lungs bilaterally, as above.   MRI examination of the brain with and without contrast, MRV  Head with and without contrast 12/11/2020:  Normal MRI of the brain. No evidence of parenchymal pathology. No imaging evidence of meningitis. The patient does have inflammatory disease of the paranasal sinuses which could relate to headache. Normal intracranial MR venography.  Assessment: 45 y.o. male with  PMHx of meningitis in 2016, 2018 thought to be 2/2 to sinusitis and RMSF respectively with fungal sinus infection identified by outpatient ENT s/p treatment who presents to the ED for evaluation of retro-orbital headache that is constant, throbbing, associated with photophobia, phonophobia, nausea, vomiting, eye pain with movement concerning for recurrence of an infectious/inflammatory process especially given preceding joint pain and rashes. -Initial examination reveals patient with ongoing headache with diplopia in right lateral gaze (potentially consistent with a cranial nerve VI palsy), eye pain with movement, and photophobia.  However headache has fully resolved today prior to initiation of antibiotics and with relatively conservative migraine cocktails of Tylenol/ibuprofen, magnesium, antihistamines and fluids - LP obtained at [facility] concerning for elevated protein and a lymphocytic predominant pleocytosis with elevated neutrophils and a normal glucose. - MRI imaging was without parenchymal pathology, or imaging evidence of meningitis.  MRI does show inflammatory disease of the paranasal sinuses. MR venogram was normal. - DDx of inflammatory CSF and patient presentation is concerning for aseptic meningitis: Autoimmune disease such as sarcoidosis, SLE or RA to be considered given history of rashes and joint pain (atypical for  Mollaret's), notably patient does not meet typical syndromic criteria that he does have a positive ANA on serologies this Recurrent infection such as Lyme or RMSF (less likely, concerned prior diagnoses may be false positives based on prior ID evaluation) Mollaret's meningitis (diagnosis of exclusion, less likely given systemic symptom of prior rashes and migrating arthralgias currently) Provoked headache in the setting of ethmoid sinusitis, to be evaluated for with further imaging Consider etiologies such as IgG4 disease/T[place]-Hunt syndrome although again patient does satisfy the typical constellation of findings in these syndromes (for example for CSF in Tol[place]-Hunt is classically fairly bland)  Thus far the patient's clinical course remains puzzling as I would not expect such a dramatic improvement so quickly with scheduled Tylenol/ibuprofen  Recommendations: - Follow up on pending labs as above - Stop scheduled Tylenol and ibuprofen and Benadryl, making these as needed - Ceftriaxone IV 2 g every 12 hours pending further imaging given concern for potential partially treated bacterial meningitis although again I admit that the patient's clinical course continues to be atypical - Agree with MRI fiesta protocol to evaluate for potential CSF leak, appreciate neuroradiology and ENT input - We will hold off on steroids given improvement - Patient will benefit from outpatient rheumatology follow-up, we did discuss the high prevalence of ANA within the normal population, and the fact that he does not clearly fit a defined rheumatological syndrome to my knowledge, although I remain concerned about his intermittent rashes and unexplained migrating joint pain  Brooke Dare MD-PhD [facility] [phone]  Available 7 AM to 7 PM, outside these hours please contact Neurologist on call listed on AMION   Greater than 35 minutes were spent in care of this patient today, greater than 50% at  bedside answering his questions about work-up and plan as documented above

## 2020-12-14 ENCOUNTER — Inpatient Hospital Stay (HOSPITAL_COMMUNITY): Payer: BC Managed Care – PPO

## 2020-12-14 LAB — CBC WITH DIFFERENTIAL/PLATELET
Abs Immature Granulocytes: 0.04 10*3/uL (ref 0.00–0.07)
Basophils Absolute: 0.1 10*3/uL (ref 0.0–0.1)
Basophils Relative: 1 %
Eosinophils Absolute: 0.5 10*3/uL (ref 0.0–0.5)
Eosinophils Relative: 5 %
HCT: 45.9 % (ref 39.0–52.0)
Hemoglobin: 15.3 g/dL (ref 13.0–17.0)
Immature Granulocytes: 0 %
Lymphocytes Relative: 30 %
Lymphs Abs: 3.1 10*3/uL (ref 0.7–4.0)
MCH: 30.6 pg (ref 26.0–34.0)
MCHC: 33.3 g/dL (ref 30.0–36.0)
MCV: 91.8 fL (ref 80.0–100.0)
Monocytes Absolute: 0.7 10*3/uL (ref 0.1–1.0)
Monocytes Relative: 7 %
Neutro Abs: 6 10*3/uL (ref 1.7–7.7)
Neutrophils Relative %: 57 %
Platelets: 203 10*3/uL (ref 150–400)
RBC: 5 MIL/uL (ref 4.22–5.81)
RDW: 12.9 % (ref 11.5–15.5)
WBC: 10.3 10*3/uL (ref 4.0–10.5)
nRBC: 0 % (ref 0.0–0.2)

## 2020-12-14 LAB — CSF CULTURE W GRAM STAIN: Culture: NO GROWTH

## 2020-12-14 LAB — C3 COMPLEMENT: C3 Complement: 137 mg/dL (ref 82–167)

## 2020-12-14 LAB — IGG 1, 2, 3, AND 4
IgG (Immunoglobin G), Serum: 1002 mg/dL (ref 603–1613)
IgG, Subclass 1: 541 mg/dL (ref 248–810)
IgG, Subclass 2: 343 mg/dL (ref 130–555)
IgG, Subclass 3: 36 mg/dL (ref 15–102)
IgG, Subclass 4: 57 mg/dL (ref 2–96)

## 2020-12-14 LAB — C4 COMPLEMENT: Complement C4, Body Fluid: 27 mg/dL (ref 12–38)

## 2020-12-14 LAB — ANGIOTENSIN CONVERTING ENZYME: Angiotensin-Converting Enzyme: 45 U/L (ref 14–82)

## 2020-12-14 LAB — CYCLIC CITRUL PEPTIDE ANTIBODY, IGG/IGA: CCP Antibodies IgG/IgA: 2 units (ref 0–19)

## 2020-12-14 LAB — RHEUMATOID FACTOR: Rheumatoid fact SerPl-aCnc: 11.6 IU/mL (ref <14.0)

## 2020-12-14 MED ORDER — AMOXICILLIN-POT CLAVULANATE 875-125 MG PO TABS: 1.0000 | ORAL_TABLET | Freq: Two times a day (BID) | ORAL | 0 refills | Status: DC

## 2020-12-14 NOTE — Progress Notes (Signed)
PROGRESS NOTE    Jon Saunders  OAC:[id number] DOB: 27-May-1975 DOA: 12/10/2020 PCP: [name], NP   Brief Narrative:  Jon Saunders is a 45 y.o. male with medical history significant of RMSF-associated meningitis (2018) presenting with headache.  He was seen at [facility] last Wednesday and given Augmentin for apparent urinary infection.   He has had 6 doses of antibiotics.  On Friday, he developed headache behind his eyes.  The pain progressed through the weekend and he went to UC day prior and they recommended he go to the ER.  This pain was similar to when he had viral meningitis in the past.  No significant nasal congestion, but CT showed pansinusitis.  +photophobia/phonophobia.  Last COVID booster was 1 week ago.  Admitted to hospitalist service and neurology consulted.  Assessment & Plan:   Principal Problem:   Headache behind the eyes Active Problems:   Obesity   Depression with anxiety  Headache: Patient with h/o viral meningitis with RMSF + in 2018. Also with equivocal CSF result prior. Now with severe headache behind his eyes with photophobia/phonophobia, reports similar to prior headaches associated with meningitis. LP performed - not overly concerning for meningitis -Discussed with 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   MRI brain as well as MRV head with and without contrast unremarkable.  CT head once again unremarkable.  Just pansinusitis.  MRI orbits bilateral also negative.  Due to pansinusitis, ENT  consulted.  Per Dr. Elijah Birk of ENT there is question of bony widening just anterior to cribriform plate.  He recommended repeat MRI of the brain with and without contrast  and cisternogram looking for CSF leak and anterior cranial fossa.  Patient was started on ceftriaxone 2 g IV twice daily to cover sinus disease and possible meningitis of sinus etiology.  If no leak on the scan, switch to oral Augmentin and follow-up with ENT in 2 weeks.  Scan is done.  We are awaiting neurology and ENT further recommendations.  Patient is very eager and anxious to get out of here.  He has no symptoms at this point in time.  I have notified Dr. Iver Nestle of neurology about this and I am waiting for further recommendations.  Anxiety/depression -Continue Prozac   Obesity -Body mass index is 32.28 kg/m.  Exercise and weight loss encouraged. -Weight loss should be encouraged  DVT prophylaxis: enoxaparin (LOVENOX) injection 40 mg Start: 12/11/20 2200   Code Status: Full Code  Family Communication: None at bedside.  Plan of care discussed with patient.  Status is: Inpatient  Remains inpatient appropriate because:Ongoing diagnostic testing needed not appropriate for outpatient work up  Dispo: The patient is from: Home              Anticipated d/c is to: Home              Patient currently is not medically stable to d/c.   Difficult to place patient No   Estimated body mass index is 32.28 kg/m as calculated from the following:   Height as of this encounter: 5\' 10"  (1.778 m).   Weight as of this encounter: 102.1 kg.  Nutritional Assessment: Body mass index is 32.28 kg/m. Seen by  dietician.  I agree with the assessment and plan as outlined below: Nutrition Status:   Skin Assessment: I have examined the patient's skin and I agree with the wound assessment as performed by the wound care RN as outlined below:    Consultants:  Neurology  Procedures:  None  Antimicrobials:  Anti-infectives (From admission, onward)    Start     Dose/Rate Route Frequency Ordered Stop   12/13/20 1400  cefTRIAXone (ROCEPHIN) 2 g in sodium chloride 0.9 % 100 mL IVPB        2 g 200 mL/hr  over 30 Minutes Intravenous Every 12 hours 12/13/20 1304     12/10/20 2230  cefTRIAXone (ROCEPHIN) 1 g in sodium chloride 0.9 % 100 mL IVPB       See Hyperspace for full Linked Orders Report.   1 g 200 mL/hr over 30 Minutes Intravenous  Once 12/10/20 2156 12/10/20 2330   12/10/20 2200  cefTRIAXone (ROCEPHIN) 1 g in sodium chloride 0.9 % 100 mL IVPB       See Hyperspace for full Linked Orders Report.   1 g 200 mL/hr over 30 Minutes Intravenous  Once 12/10/20 2156 12/10/20 2242   12/10/20 2145  cefTRIAXone (ROCEPHIN) 2 g in sodium chloride 0.9 % 100 mL IVPB  Status:  Discontinued        2 g 200 mL/hr over 30 Minutes Intravenous  Once 12/10/20 2142 12/10/20 2156   12/10/20 2145  vancomycin (VANCOCIN) IVPB 1000 mg/200 mL premix        1,000 mg 200 mL/hr over 60 Minutes Intravenous  Once 12/10/20 2142 12/11/20 0046          Subjective: Patient seen and examined.  His wife is at the bedside.  Patient states that he has no more symptoms.  He is totally symptom-free and wants to go home today.  Objective: Vitals:   12/13/20 0401 12/13/20 1252 12/13/20 2000 12/14/20 0511  BP: 115/81 115/77 123/82 102/61  Pulse: 60 60 80 95  Resp: 18  (!) 22 17  Temp: 97.9 F (36.6 C) (!) 97.3 F (36.3 C) 98.6 F (37 C) 98.5 F (36.9 C)  TempSrc: Oral  Oral Oral  SpO2: 95% 97% 95% 95%  Weight:      Height:        Intake/Output Summary (Last 24 hours) at 12/14/2020 1441 Last data filed at 12/14/2020 0406 Gross per 24 hour  Intake 1976.47 ml  Output --  Net 1976.47 ml    Filed Weights   12/10/20 1528  Weight: 102.1 kg    Examination:  General exam: Appears calm and comfortable  Respiratory system: Clear to auscultation. Respiratory effort normal. Cardiovascular system: S1 & S2 heard, RRR. No JVD, murmurs, rubs, gallops or clicks. No pedal edema. Gastrointestinal system: Abdomen is nondistended, soft and nontender. No organomegaly or masses felt. Normal bowel sounds heard. Central nervous  system: Alert and oriented. No focal neurological deficits. Extremities: Symmetric 5 x 5 power. Skin: No rashes, lesions or ulcers.  Psychiatry: Judgement and insight appear normal. Mood & affect appropriate.    Data Reviewed: I have personally reviewed following labs and imaging studies  CBC: Recent Labs  Lab 12/10/20 1534 12/12/20 0219 12/13/20 0111 12/14/20 0225  WBC 7.8 8.9 13.4* 10.3  NEUTROABS 4.8  --  8.4* 6.0  HGB 16.3 14.1 15.1 15.3  HCT 48.9 42.7 44.4 45.9  MCV 90.7 91.4 89.9 91.8  PLT 216 187 195 203    Basic Metabolic Panel: Recent  Labs  Lab 12/10/20 1534 12/12/20 0219 12/13/20 0111  NA 139 136 134*  K 3.9 3.6 3.8  CL 103 104 102  CO2 29 26 21*  GLUCOSE 85 91 105*  BUN 14 11 14   CREATININE 1.00 1.03 1.01  CALCIUM 9.4 8.7* 8.9    GFR: Estimated Creatinine Clearance: 112.8 mL/min (by C-G formula based on SCr of 1.01 mg/dL). Liver Function Tests: Recent Labs  Lab 12/10/20 1534  AST 22  ALT 24  ALKPHOS 66  BILITOT 0.5  PROT 7.6  ALBUMIN 4.5    No results for input(s): LIPASE, AMYLASE in the last 168 hours. No results for input(s): AMMONIA in the last 168 hours. Coagulation Profile: No results for input(s): INR, PROTIME in the last 168 hours. Cardiac Enzymes: No results for input(s): CKTOTAL, CKMB, CKMBINDEX, TROPONINI in the last 168 hours. BNP (last 3 results) No results for input(s): PROBNP in the last 8760 hours. HbA1C: No results for input(s): HGBA1C in the last 72 hours. CBG: No results for input(s): GLUCAP in the last 168 hours. Lipid Profile: No results for input(s): CHOL, HDL, LDLCALC, TRIG, CHOLHDL, LDLDIRECT in the last 72 hours. Thyroid Function Tests: Recent Labs    12/11/20 2125  TSH 4.440    Anemia Panel: No results for input(s): VITAMINB12, FOLATE, FERRITIN, TIBC, IRON, RETICCTPCT in the last 72 hours. Sepsis Labs: No results for input(s): PROCALCITON, LATICACIDVEN in the last 168 hours.  Recent Results (from the  past 240 hour(s))  Resp Panel by RT-PCR (Flu A&B, Covid) Nasopharyngeal Swab     Status: None   Collection Time: 12/10/20  3:38 PM   Specimen: Nasopharyngeal Swab; Nasopharyngeal(NP) swabs in vial transport medium  Result Value Ref Range Status   SARS Coronavirus 2 by RT PCR NEGATIVE NEGATIVE Final    Comment: (NOTE) SARS-CoV-2 target nucleic acids are NOT DETECTED.  The SARS-CoV-2 RNA is generally detectable in upper respiratory specimens during the acute phase of infection. The lowest concentration of SARS-CoV-2 viral copies this assay can detect is 138 copies/mL. A negative result does not preclude SARS-Cov-2 infection and should not be used as the sole basis for treatment or other patient management decisions. A negative result may occur with  improper specimen collection/handling, submission of specimen other than nasopharyngeal swab, presence of viral mutation(s) within the areas targeted by this assay, and inadequate number of viral copies(<138 copies/mL). A negative result must be combined with clinical observations, patient history, and epidemiological information. The expected result is Negative.  Fact Sheet for Patients:  02/09/21  Fact Sheet for Healthcare Providers:  BloggerCourse.com  This test is no t yet approved or cleared by the [employer] FDA and  has been authorized for detection and/or diagnosis of SARS-CoV-2 by FDA under an Emergency Use Authorization (EUA). This EUA will remain  in effect (meaning this test can be used) for the duration of the COVID-19 declaration under Section 564(b)(1) of the Act, 21 U.S.C.section 360bbb-3(b)(1), unless the authorization is terminated  or revoked sooner.       Influenza A by PCR NEGATIVE NEGATIVE Final   Influenza B by PCR NEGATIVE NEGATIVE Final    Comment: (NOTE) The Xpert Xpress SARS-CoV-2/FLU/RSV plus assay is intended as an aid in the diagnosis of  influenza from Nasopharyngeal swab specimens and should not be used as a sole basis for treatment. Nasal washings and aspirates are unacceptable for Xpert Xpress SARS-CoV-2/FLU/RSV testing.  Fact Sheet for Patients: [place]  Fact Sheet for Healthcare Providers: BloggerCourse.com  This test  is not yet approved or cleared by the [place] and has been authorized for detection and/or diagnosis of SARS-CoV-2 by FDA under an Emergency Use Authorization (EUA). This EUA will remain in effect (meaning this test can be used) for the duration of the COVID-19 declaration under Section 564(b)(1) of the Act, 21 U.S.C. section 360bbb-3(b)(1), unless the authorization is terminated or revoked.  Performed at [facility], [address] [place], [place] [address]   CSF culture     Status: None   Collection Time: 12/10/20  7:22 PM   Specimen: CSF; Cerebrospinal Fluid  Result Value Ref Range Status   Specimen Description   Final    CSF Performed at [facility], [address] [place], [place] [address]    Special Requests   Final    NONE Performed at [facility], [address] [place], [place] [address]    Gram Stain   Final    WBC PRESENT, PREDOMINANTLY MONONUCLEAR NO ORGANISMS SEEN CYTOSPIN SMEAR    Culture   Final    NO GROWTH Performed at [facility], [address] [place], [place] [address]    Report Status 12/14/2020 FINAL  Final       Radiology Studies: MR BRAIN WO CONTRAST  Result Date: 12/14/2020 CLINICAL DATA:  Headache, chronic, no new features EXAM: MRI HEAD WITHOUT CONTRAST TECHNIQUE: Multiplanar, multiecho pulse sequences of the brain and surrounding structures were obtained without intravenous contrast. COMPARISON:  MRI of the orbits 12/13/2020. MRI head and MRV head without and with contrast 12/11/2020. Head CT 12/10/2020.  FINDINGS: Mildly motion degraded exam. At the ordering provider's request, heavily T2-weighted axial and coronal FIESTA sequences were acquired through the face and anterior cranial fossa to assess for foci of osseous dehiscence. In correlating with the recent prior MRI of the orbits 12/13/2020, MRI head and MRV head 12/11/2020 and head CT 12/10/2020, there is osseous thinning of the right cribriform plate/lateral lamella without convincing complete dehiscence. Persistent extensive partial opacification of the right frontal sinus secondary to the presence of mucosal thickening and fluid. Persistent partial T2 hyperintense opacification of the bilateral ethmoid air cells. Mild mucosal thickening within the bilateral sphenoid sinuses. IMPRESSION: Mildly motion degraded exam. Heavily T2-weighted FIESTA sequences acquired through the face and anterior cranial fossa to assess for foci of osseous dehiscence. In correlating with the recent prior MRI of the orbits 12/13/2020, MRI/MRV head 12/11/2020 and head CT 12/10/2020, there is osseous thinning of the right cribriform plate/lateral lamella without convincing complete dehiscence. Persistent paranasal sinus disease, as described. Electronically Signed   By: Jackey Loge D.O.   On: 12/14/2020 10:42   MR ORBITS W WO CONTRAST  Result Date: 12/13/2020 CLINICAL DATA:  Initial evaluation for ophthalmoplegia. EXAM: MRI OF THE ORBITS WITHOUT AND WITH CONTRAST TECHNIQUE: Multiplanar, multi-echo pulse sequences of the orbits and surrounding structures were acquired including fat saturation techniques, before and after intravenous contrast administration. CONTRAST:  10mL GADAVIST GADOBUTROL 1 MMOL/ML IV SOLN COMPARISON:  Prior MRI from 12/11/2020. FINDINGS: Orbits: Globes are symmetric in size with normal morphology and appearance bilaterally. Optic nerves symmetric and normal. No intrinsic optic nerve edema or enhancement to suggest optic neuritis. No abnormality seen about  either optic nerve sheath. No abnormality about the orbital apices or cavernous sinus. Optic chiasm normally situated within the suprasellar cistern without abnormality. No sellar or suprasellar mass. Visualized optic radiations normal. Extra-ocular muscles symmetric and within normal limits. Intraconal and extraconal fat well-maintained without  evidence for intraorbital or postseptal cellulitis. Lacrimal glands within normal limits. Superior orbital veins symmetric and normal. Visualized sinuses: Sequelae of prior sinus surgery noted. Extensive mucosal thickening seen throughout the right frontal sinus, ethmoidal air cells, right greater than left sphenoid sinuses, and right greater than left maxillary sinuses. No visible evidence for extension of infection across either lamina papyracea. No evidence for intracranial spread of infection. Soft tissues: Preseptal and periorbital soft tissues within normal limits. Limited intracranial: Unremarkable, with no new intracranial abnormality visible. IMPRESSION: 1. Normal MRI of the orbits. 2. Pan sinusitis, overall slightly worse on the right. No MRI evidence for intraorbital or intracranial spread of infection. Electronically Signed   By: Rise Mu M.D.   On: 12/13/2020 05:00    Scheduled Meds:  docusate sodium  100 mg Oral BID   enoxaparin (LOVENOX) injection  40 mg Subcutaneous Q24H   FLUoxetine  40 mg Oral Daily   sodium chloride flush  3 mL Intravenous Q12H   Continuous Infusions:  cefTRIAXone (ROCEPHIN)  IV 2 g (12/14/20 0175)   lactated ringers Stopped (12/14/20 0822)     LOS: 3 days   Time spent: 29 minutes   Hughie Closs, MD [facility]  12/14/2020, 2:41 PM  Please page via Amion and do not message via secure chat for anything urgent. Secure chat can be used for anything non urgent.  How to contact the [facility] Attending or Consulting provider 7A - 7P or covering provider during after hours 7P -7A, for this patient?  Check the  care team in [facility] and look for a) attending/consulting TR[facility] provider listed and b) the [facility] team listed. Page or secure chat 7A-7P. Log into [email or web address] and use [place] universal password to access. If you do not have the password, please contact the hospital operator. Locate the [facility] provider you are looking for under [facility] and page to a number that you can be directly reached. If you still have difficulty reaching the provider, please page the [place] (Director on Call) for the Hospitalists listed on amion for assistance.

## 2020-12-14 NOTE — Progress Notes (Signed)
Neurology Progress Note  Brief HPI: 45 y.o. 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 Headache is worsened by laying flat and bending over with diplopia on right lateral gaze and improved with patient sitting up concerning for increased ICP. He also complained of various pains including severe unprovoked lumbar spine back pain that is exacerbated by movement and improved with rest s/p Augmentin Rx with concern for UTI and left knee and left rib pain on Saturday and Sunday. LP was performed at [facility] with concern for recurrence of an infectious/inflammatory process given preceding joint pain and rash that revealed an elevated protein, lymphocytic predominant pleocytosis with elevated neurophil count and a normal glucose without a reported opening pressure.   Subjective: Remains asymptomatic at this time and eager to go home  Exam: Current vital signs: BP 102/61 (BP Location: Right Arm)   Pulse 95   Temp 98.5 F (36.9 C) (Oral)   Resp 17   Ht 5\' 10"  (1.778 m)   Wt 102.1 kg   SpO2 95%   BMI 32.28 kg/m  Vital signs in last 24 hours: Temp:  [98.5 F (36.9 C)-98.6 F (37 C)] 98.5 F (36.9 C) (10/06 0511) Pulse Rate:  [80-95] 95 (10/06 0511) Resp:  [17-22] 17 (10/06 0511) BP: (102-123)/(61-82) 102/61 (10/06 0511) SpO2:  [95 %] 95 % (10/06 0511)   Gen: Asleep initially, laying comfortably in bed, in no acute distress Resp: non-labored breathing, no respiratory distress Abd: soft, non-tender, non-distended  Neuro: Mental Status: Awake, alert, and oriented x 4. He is able to provide a clear and coherent history of present illness, speech is intact  without dysarthria, he follows commands without difficulty.  There is no evidence of aphasia or neglect.  Cranial Nerves: PERRL, VFF, EOMI without pain today and without diplopia, facial sensation is intact and symmetric to light touch, face is symmetric resting and smiling, hearing is intact to voice,  Motor: Moving all 4 extremities freely and grossly equally  Pertinent Labs: CBC    Component Value Date/Time   WBC 10.3 12/14/2020 0225   RBC 5.00 12/14/2020 0225   HGB 15.3 12/14/2020 0225   HGB 15.0 07/05/2019 1541   HCT 45.9 12/14/2020 0225   HCT 45.6 07/05/2019 1541   PLT 203 12/14/2020 0225   PLT 218 07/05/2019 1541   MCV 91.8 12/14/2020 0225   MCV 90 07/05/2019 1541   MCH 30.6 12/14/2020 0225   MCHC 33.3 12/14/2020 0225   RDW 12.9 12/14/2020 0225   RDW 13.1 07/05/2019 1541   LYMPHSABS 3.1 12/14/2020 0225   LYMPHSABS 3.3 (H) 07/05/2019 1541   MONOABS 0.7 12/14/2020 0225   EOSABS 0.5 12/14/2020 0225   EOSABS 0.2 07/05/2019 1541   BASOSABS 0.1 12/14/2020 0225   BASOSABS 0.0 07/05/2019 1541   CMP     Component Value Date/Time   NA 134 (L) 12/13/2020 0111   NA 138 07/05/2019 1541   K 3.8 12/13/2020 0111   CL 102 12/13/2020 0111   CO2 21 (L) 12/13/2020 0111   GLUCOSE 105 (H) 12/13/2020 0111   BUN 14 12/13/2020 0111   BUN 16 07/05/2019 1541   CREATININE 1.01 12/13/2020 0111   CREATININE 0.88 01/05/2015 1429  CALCIUM 8.9 12/13/2020 0111   PROT 7.6 12/10/2020 1534   PROT 6.8 07/05/2019 1541   ALBUMIN 4.5 12/10/2020 1534   ALBUMIN 4.3 07/05/2019 1541   AST 22 12/10/2020 1534   ALT 24 12/10/2020 1534   ALKPHOS 66 12/10/2020 1534   BILITOT 0.5 12/10/2020 1534   BILITOT 0.4 07/05/2019 1541   GFRNONAA >60 12/13/2020 0111   GFRAA 94 07/05/2019 1541    Ref. Range 12/10/2020 19:22 12/10/2020 19:22  Appearance, CSF Latest Ref Range: CLEAR  HAZY (A) CLEAR  Glucose, CSF Latest Ref Range: 40 - 70 mg/dL 56    RBC Count, CSF Latest Ref Range: 0 /cu mm 186 (H) 12 (H)  WBC,  CSF Latest Ref Range: 0 - 5 /cu mm 9 (H) 13 (HH)  Segmented Neutrophils-CSF Latest Ref Range: 0 - 6 % 15 (H) 0  Lymphs, CSF Latest Ref Range: 40 - 80 % 73 94 (H)  Monocyte-Macrophage-Spinal Fluid Latest Ref Range: 15 - 45 % 11 (L) 6 (L)  Eosinophils, CSF Latest Ref Range: 0 - 1 % 1 0  Color, CSF Latest Ref Range: COLORLESS  PINK (A) COLORLESS  Supernatant Unknown COLORLESS NOT INDICATED  Total  Protein, CSF Latest Ref Range: 15 - 45 mg/dL 64 (H)    Tube # Unknown 1 4   Protein Creatinine Ratio 0.00 - 0.15 mg/mg 0.03    ANA positive, titer pending RNP antibody positive at 1.8 HIV and RPR negative TSH 4.440, thyroperoxidase and thyroglobulin antibodies negative, anti-Jo 1, centromere AB, dsDNA, c-ANCA, p-ANCA, atypical p-ANCA, ANA, chromatin, MPO, PR-3, ENA, SSA, SSB, scleroderma antibodies all negative   Unresulted Labs (From admission, onward)     Start     Ordered   12/11/20 2010  Antiphospholipid syndrome eval, bld  Once,   R        12/11/20 2021   12/11/20 1958  Rocky mtn spotted fvr abs pnl(IgG+IgM)  Once,   R        12/11/20 2021           Imaging Reviewed:  CT Chest 12/12/2020 Personally reviewed by attending MD 1. No imaging stigmata suggestive of sarcoidosis in the thorax. 2. Scattered areas of scarring and/or subsegmental atelectasis in the lungs bilaterally, as above.   MRI examination of the brain with and without contrast, MRV Head with and without contrast 12/11/2020:  Normal MRI of the brain. No evidence of parenchymal pathology. No imaging evidence of meningitis. The patient does have inflammatory disease of the paranasal sinuses which could relate to headache. Normal intracranial MR venography.  Repeat MRI of the brain 10/6 Mildly motion degraded exam. Heavily T2-weighted FIESTA sequences acquired through the face and anterior cranial fossa to assess for foci of osseous dehiscence. In correlating with the recent prior MRI of the orbits 12/13/2020, MRI/MRV  head 12/11/2020 and head CT 12/10/2020, there is osseous thinning of the right cribriform plate/lateral lamella without convincing complete dehiscence.  Persistent paranasal sinus disease, as described.  Assessment: 45 y.o. male with  PMHx of meningitis in 2016, 2018 thought to be 2/2 to sinusitis and RMSF respectively with fungal sinus infection identified by outpatient ENT s/p treatment who presents to the ED for evaluation of retro-orbital headache that is constant, throbbing, associated with photophobia, phonophobia, nausea, vomiting, eye pain with movement concerning for recurrence of an infectious/inflammatory process especially given preceding joint pain and rashes. -Initial examination reveals patient with ongoing headache with diplopia in right lateral gaze (potentially consistent with a cranial nerve VI  palsy), eye pain with movement, and photophobia.  However headache has fully resolved today prior to initiation of antibiotics and with relatively conservative migraine cocktails of Tylenol/ibuprofen, magnesium, antihistamines and fluids - LP obtained at [facility] concerning for elevated protein and a lymphocytic predominant pleocytosis with elevated neutrophils and a normal glucose. - MRI imaging was without parenchymal pathology, or imaging evidence of meningitis.  MRI does show inflammatory disease of the paranasal sinuses. MR venogram was normal.  Follow-up MRI fiesta protocol did not demonstrate any dehiscence  While a unifying diagnosis is always satisfying per "Occam's razor" there is additionally an aphorism that patients can have as many coexisting conditions as they please.  At this time, etiology of patient's aseptic meningitis remains unclear.  In discussion with ENT, agree that a small difficult to detect CSF leak may be playing a role with CSF reflecting partially treated bacterial meningitis given recent outpatient antibiotics prior to lumbar puncture.  Appreciate their  recommendations to treat with a course of Augmentin for chronic sinusitis.  Discussed with the patient follow-up with rheumatology for further evaluation of arthralgias and rashes as well as positive ANA.  Additionally discussed use of migraine cocktails on an outpatient basis with caution to avoid medication overuse headache  Recommendations: - Follow up on pending labs as above - Stop scheduled Tylenol and ibuprofen and Benadryl, making these as needed - Ceftriaxone IV 2 g every 12 hours pending further imaging given concern for potential partially treated bacterial meningitis although again I admit that the patient's clinical course continues to be atypical - Agree with MRI fiesta protocol to evaluate for potential CSF leak, appreciate neuroradiology and ENT input - We will hold off on steroids given improvement - Patient will benefit from outpatient rheumatology follow-up, we did discuss the high prevalence of ANA within the normal population, and the fact that he does not clearly fit a defined rheumatological syndrome to my knowledge, although I remain concerned about his intermittent rashes and unexplained migrating joint pain  Brooke Dare MD-PhD [facility] [phone]  Available 7 AM to 7 PM, outside these hours please contact Neurologist on call listed on AMION   Greater than 35 minutes were spent in care of this patient today, greater than 50% at bedside answering his questions about work-up and plan as documented above

## 2020-12-14 NOTE — Discharge Summary (Signed)
Physician Discharge Summary  Jon Saunders ION:[id number] DOB: 1975/05/06 DOA: 12/10/2020  PCP: [name], NP  Admit date: 12/10/2020 Discharge date: 12/14/2020 30 Day Unplanned Readmission Risk Score    Flowsheet Row ED to Hosp-Admission (Current) from 12/10/2020 in [facility]  30 Day Unplanned Readmission Risk Score (%) 6.21 Filed at 12/14/2020 1200       This score is the patient's risk of an unplanned readmission within 30 days of being discharged (0 -100%). The score is based on dignosis, age, lab data, medications, orders, and past utilization.   Low:  0-14.9   Medium: 15-21.9   High: 22-29.9   Extreme: 30 and above          Admitted From: Home Disposition: Home  Recommendations for Outpatient Follow-up:  Follow up with PCP in 1-2 weeks Please obtain BMP/CBC in one week Follow-up with ENT in 2 weeks Please follow up with your PCP on the following pending results: Unresulted Labs (From admission, onward)     Start     Ordered   12/11/20 2010  Antiphospholipid syndrome eval, bld  Once,   R        12/11/20 2021   12/11/20 1958  Rocky mtn spotted fvr abs pnl(IgG+IgM)  Once,   R        12/11/20 2021              Home Health: None Equipment/Devices: None  Discharge Condition: Stable CODE STATUS: Full code Diet recommendation: Cardiac  Subjective: Seen and examined.  No complaints.  Symptoms totally resolved.  Brief/Interim Summary: [name] is a 45 y.o. male with medical history significant of RMSF-associated meningitis (2018) presenting with headache.  He was seen at [facility] last Wednesday and given Augmentin for apparent urinary infection.   He has had 6 doses of antibiotics.  On Friday, he developed headache behind his eyes.  The pain progressed through the weekend and he went to UC day prior and they recommended he go to the ER.  This pain was similar to when he had viral meningitis in the past.  No significant nasal congestion, but CT  showed pansinusitis.  +photophobia/phonophobia.  Last COVID booster was 1 week ago.  Admitted to hospitalist service and neurology consulted.  Further hospitalization course as below.     Headache: Patient with h/o viral meningitis with RMSF + in 2018. Also with equivocal CSF result prior. Now with severe headache behind his eyes with photophobia/phonophobia, reports similar to prior headaches associated with meningitis. LP performed - not overly concerning for meningitis -Discussed with 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   MRI brain as well as MRV head with and without contrast unremarkable.  CT head once again unremarkable.  Just pansinusitis.  MRI orbits bilateral also negative.  Due to pansinusitis, ENT  consulted.  Per Dr. Elijah Birk of ENT there is question of bony widening just anterior to cribriform plate.  He recommended repeat MRI of the brain with and without contrast and cisternogram looking for CSF leak and anterior cranial fossa.  Patient was started on ceftriaxone 2 g IV twice daily to cover sinus disease and possible meningitis of sinus etiology.  If no  leak on the scan, switch to oral Augmentin and follow-up with ENT in 2 weeks.  Scan is done which does not show any CSF leak.  Neurology has discussed with ENT.  Neurology has personally seen the patient and has cleared the patient.  ENT recommended discharging patient on Augmentin twice daily for 4 days and he has follow-up with ENT as well.  Patient symptoms are completely resolved.  At this point in time, diagnosis remains uncertain.   Discharge Diagnoses:  Principal Problem:   Headache behind the eyes Active Problems:    Obesity   Depression with anxiety    Discharge Instructions   Allergies as of 12/14/2020   No Known Allergies      Medication List     TAKE these medications    acetaminophen 325 MG tablet Commonly known as: TYLENOL Take 650 mg by mouth every 6 (six) hours as needed for mild pain or fever.   amoxicillin-clavulanate 875-125 MG tablet Commonly known as: Augmentin Take 1 tablet by mouth 2 (two) times daily for 14 days.   FLUoxetine 40 MG capsule Commonly known as: PROZAC TAKE 1 CAPSULE BY MOUTH EVERY DAY   naproxen sodium 220 MG tablet Commonly known as: ALEVE Take 440 mg by mouth 2 (two) times daily as needed (headach/pain).   oxymetazoline 0.05 % nasal spray Commonly known as: AFRIN Place 1 spray into both nostrils 2 (two) times daily as needed for congestion.        Follow-up Information     Aura Dials T, NP Follow up in 1 week(s).   Specialty: Nurse Practitioner Contact information: [address] [place] [address] [phone]                No Known Allergies  Consultations: Neurology and ENT   Procedures/Studies: CT Head Wo Contrast  Result Date: 12/10/2020 CLINICAL DATA:  Headache. EXAM: CT HEAD WITHOUT CONTRAST TECHNIQUE: Contiguous axial images were obtained from the base of the skull through the vertex without intravenous contrast. COMPARISON:  CT head dated 08/24/2016. FINDINGS: Brain: No evidence of acute infarction, hemorrhage, hydrocephalus, extra-axial collection or mass lesion/mass effect. Vascular: No hyperdense vessel or unexpected calcification. Skull: Normal. Negative for fracture or focal lesion. Sinuses/Orbits: There is bilateral frontal, ethmoid, sphenoid, and maxillary sinus disease. No mastoid effusions. Other: None. IMPRESSION: 1. No acute intracranial process. 2. Pansinusitis. Electronically Signed   By: Romona Curls M.D.   On: 12/10/2020 17:46   CT CHEST WO CONTRAST  Result Date: 12/12/2020 CLINICAL DATA:   45 year old male with clinical suspicion of sarcoidosis. EXAM: CT CHEST WITHOUT CONTRAST TECHNIQUE: Multidetector CT imaging of the chest was performed following the standard protocol without IV contrast. COMPARISON:  No priors. FINDINGS: Cardiovascular: Heart size is normal. There is no significant pericardial fluid, thickening or pericardial calcification. No atherosclerotic calcifications in the thoracic aorta or the coronary arteries. Separate origin of the left vertebral artery directly off the aortic arch (normal anatomical variant). Mediastinum/Nodes: No pathologically enlarged mediastinal or hilar lymph nodes. Please note that accurate exclusion of hilar adenopathy is limited on noncontrast CT scans. Esophagus is unremarkable in appearance. No axillary lymphadenopathy. Lungs/Pleura: There are some dependent linear opacities in the lower lobes of the lungs bilaterally, and to a lesser extent in the dependent portion of the left upper lobe and inferior segment of the lingula which may reflect areas of subsegmental atelectasis and/or chronic scarring. No acute consolidative airspace disease. No pleural effusions. No suspicious appearing pulmonary nodules or masses are noted.  Upper Abdomen: Unremarkable. Musculoskeletal: There are no aggressive appearing lytic or blastic lesions noted in the visualized portions of the skeleton. IMPRESSION: 1. No imaging stigmata suggestive of sarcoidosis in the thorax. 2. Scattered areas of scarring and/or subsegmental atelectasis in the lungs bilaterally, as above. Electronically Signed   By: Trudie Reed M.D.   On: 12/12/2020 08:07   MR BRAIN WO CONTRAST  Result Date: 12/14/2020 CLINICAL DATA:  Headache, chronic, no new features EXAM: MRI HEAD WITHOUT CONTRAST TECHNIQUE: Multiplanar, multiecho pulse sequences of the brain and surrounding structures were obtained without intravenous contrast. COMPARISON:  MRI of the orbits 12/13/2020. MRI head and MRV head without and  with contrast 12/11/2020. Head CT 12/10/2020. FINDINGS: Mildly motion degraded exam. At the ordering provider's request, heavily T2-weighted axial and coronal FIESTA sequences were acquired through the face and anterior cranial fossa to assess for foci of osseous dehiscence. In correlating with the recent prior MRI of the orbits 12/13/2020, MRI head and MRV head 12/11/2020 and head CT 12/10/2020, there is osseous thinning of the right cribriform plate/lateral lamella without convincing complete dehiscence. Persistent extensive partial opacification of the right frontal sinus secondary to the presence of mucosal thickening and fluid. Persistent partial T2 hyperintense opacification of the bilateral ethmoid air cells. Mild mucosal thickening within the bilateral sphenoid sinuses. IMPRESSION: Mildly motion degraded exam. Heavily T2-weighted FIESTA sequences acquired through the face and anterior cranial fossa to assess for foci of osseous dehiscence. In correlating with the recent prior MRI of the orbits 12/13/2020, MRI/MRV head 12/11/2020 and head CT 12/10/2020, there is osseous thinning of the right cribriform plate/lateral lamella without convincing complete dehiscence. Persistent paranasal sinus disease, as described. Electronically Signed   By: Jackey Loge D.O.   On: 12/14/2020 10:42   MR BRAIN W WO CONTRAST  Result Date: 12/11/2020 CLINICAL DATA:  History of meningitis.  Headache and neck pain. EXAM: MRI HEAD WITHOUT AND WITH CONTRAST MRV HEAD WITHOUT and with CONTRAST TECHNIQUE: Multiplanar, multiecho pulse sequences of the brain and surrounding structures were obtained without and with intravenous contrast. Angiographic images of the intracranial venous structures were obtained using MRV technique without intravenous contrast. CONTRAST:  10mL GADAVIST GADOBUTROL 1 MMOL/ML IV SOLN COMPARISON:  None. FINDINGS: Diffusion imaging does not show any acute or subacute infarction. The brainstem and cerebellum are  normal. Cerebral hemispheres are normal. No evidence of old or acute small vessel infarction, mass lesion, hemorrhage, hydrocephalus or extra-axial collection. After contrast administration, no abnormal enhancement occurs. No sign of leptomeningeal enhancement to suggest meningitis by imaging. There is mucosal inflammatory disease throughout the paranasal sinuses. The orbits appear normal. MR venography is normal. Superior sagittal sinus is patent. Transverse sinuses are patent. Sigmoid sinuses are patent. Both jugular veins are patent. Deep venous system appears normal. No discernible superficial thrombosis. IMPRESSION: Normal MRI of the brain. No evidence of parenchymal pathology. No imaging evidence of meningitis. The patient does have inflammatory disease of the paranasal sinuses which could relate to headache. Normal intracranial MR venography. Electronically Signed   By: Paulina Fusi M.D.   On: 12/11/2020 15:40   CT Abdomen Pelvis W Contrast  Result Date: 12/10/2020 CLINICAL DATA:  Abdominal abscess/infection suspected. Headache. Jaw pain. EXAM: CT ABDOMEN AND PELVIS WITH CONTRAST TECHNIQUE: Multidetector CT imaging of the abdomen and pelvis was performed using the standard protocol following bolus administration of intravenous contrast. CONTRAST:  80mL OMNIPAQUE IOHEXOL 350 MG/ML SOLN COMPARISON:  None. FINDINGS: Lower chest: Minimal atelectasis at both lung bases. Heart size is  normal. Hepatobiliary: No focal liver abnormality is seen. No radiopaque gallstones, biliary dilatation, or pericholecystic inflammatory changes. Pancreas: Unremarkable. No pancreatic ductal dilatation or surrounding inflammatory changes. Spleen: Normal in size without focal abnormality. Adrenals/Urinary Tract: Adrenals are normal. 0.6 centimeters cyst identified in the anterior midpole of the LEFT kidney. No suspicious renal mass. No hydronephrosis. Ureters are unremarkable. The bladder and visualized portion of the urethra are  normal. Stomach/Bowel: Stomach and small bowel loops are normal in appearance. The appendix is not well seen. Loops of colon are unremarkable. Moderate stool burden. Vascular/Lymphatic: No significant vascular findings are present. No enlarged abdominal or pelvic lymph nodes. Reproductive: Prostate is unremarkable. Other: Small paraumbilical hernia.  No ascites. Musculoskeletal: No acute or significant osseous findings. IMPRESSION: 1.  No evidence for acute  abnormality. 2. No acute a urinary tract abnormality. 3. Appendix is not well seen. 4. Small paraumbilical hernia incidentally noted. Electronically Signed   By: Norva Pavlov M.D.   On: 12/10/2020 17:42   MR MRV HEAD W WO CONTRAST  Result Date: 12/11/2020 CLINICAL DATA:  History of meningitis.  Headache and neck pain. EXAM: MRI HEAD WITHOUT AND WITH CONTRAST MRV HEAD WITHOUT and with CONTRAST TECHNIQUE: Multiplanar, multiecho pulse sequences of the brain and surrounding structures were obtained without and with intravenous contrast. Angiographic images of the intracranial venous structures were obtained using MRV technique without intravenous contrast. CONTRAST:  66mL GADAVIST GADOBUTROL 1 MMOL/ML IV SOLN COMPARISON:  None. FINDINGS: Diffusion imaging does not show any acute or subacute infarction. The brainstem and cerebellum are normal. Cerebral hemispheres are normal. No evidence of old or acute small vessel infarction, mass lesion, hemorrhage, hydrocephalus or extra-axial collection. After contrast administration, no abnormal enhancement occurs. No sign of leptomeningeal enhancement to suggest meningitis by imaging. There is mucosal inflammatory disease throughout the paranasal sinuses. The orbits appear normal. MR venography is normal. Superior sagittal sinus is patent. Transverse sinuses are patent. Sigmoid sinuses are patent. Both jugular veins are patent. Deep venous system appears normal. No discernible superficial thrombosis. IMPRESSION: Normal  MRI of the brain. No evidence of parenchymal pathology. No imaging evidence of meningitis. The patient does have inflammatory disease of the paranasal sinuses which could relate to headache. Normal intracranial MR venography. Electronically Signed   By: Paulina Fusi M.D.   On: 12/11/2020 15:40   MR ORBITS W WO CONTRAST  Result Date: 12/13/2020 CLINICAL DATA:  Initial evaluation for ophthalmoplegia. EXAM: MRI OF THE ORBITS WITHOUT AND WITH CONTRAST TECHNIQUE: Multiplanar, multi-echo pulse sequences of the orbits and surrounding structures were acquired including fat saturation techniques, before and after intravenous contrast administration. CONTRAST:  20mL GADAVIST GADOBUTROL 1 MMOL/ML IV SOLN COMPARISON:  Prior MRI from 12/11/2020. FINDINGS: Orbits: Globes are symmetric in size with normal morphology and appearance bilaterally. Optic nerves symmetric and normal. No intrinsic optic nerve edema or enhancement to suggest optic neuritis. No abnormality seen about either optic nerve sheath. No abnormality about the orbital apices or cavernous sinus. Optic chiasm normally situated within the suprasellar cistern without abnormality. No sellar or suprasellar mass. Visualized optic radiations normal. Extra-ocular muscles symmetric and within normal limits. Intraconal and extraconal fat well-maintained without evidence for intraorbital or postseptal cellulitis. Lacrimal glands within normal limits. Superior orbital veins symmetric and normal. Visualized sinuses: Sequelae of prior sinus surgery noted. Extensive mucosal thickening seen throughout the right frontal sinus, ethmoidal air cells, right greater than left sphenoid sinuses, and right greater than left maxillary sinuses. No visible evidence for extension of infection across either  lamina papyracea. No evidence for intracranial spread of infection. Soft tissues: Preseptal and periorbital soft tissues within normal limits. Limited intracranial: Unremarkable, with no  new intracranial abnormality visible. IMPRESSION: 1. Normal MRI of the orbits. 2. Pan sinusitis, overall slightly worse on the right. No MRI evidence for intraorbital or intracranial spread of infection. Electronically Signed   By: Rise Mu M.D.   On: 12/13/2020 05:00     Discharge Exam: Vitals:   12/13/20 2000 12/14/20 0511  BP: 123/82 102/61  Pulse: 80 95  Resp: (!) 22 17  Temp: 98.6 F (37 C) 98.5 F (36.9 C)  SpO2: 95% 95%   Vitals:   12/13/20 0401 12/13/20 1252 12/13/20 2000 12/14/20 0511  BP: 115/81 115/77 123/82 102/61  Pulse: 60 60 80 95  Resp: 18  (!) 22 17  Temp: 97.9 F (36.6 C) (!) 97.3 F (36.3 C) 98.6 F (37 C) 98.5 F (36.9 C)  TempSrc: Oral  Oral Oral  SpO2: 95% 97% 95% 95%  Weight:      Height:        General: Pt is alert, awake, not in acute distress Cardiovascular: RRR, S1/S2 +, no rubs, no gallops Respiratory: CTA bilaterally, no wheezing, no rhonchi Abdominal: Soft, NT, ND, bowel sounds + Extremities: no edema, no cyanosis    The results of significant diagnostics from this hospitalization (including imaging, microbiology, ancillary and laboratory) are listed below for reference.     Microbiology: Recent Results (from the past 240 hour(s))  Resp Panel by RT-PCR (Flu A&B, Covid) Nasopharyngeal Swab     Status: None   Collection Time: 12/10/20  3:38 PM   Specimen: Nasopharyngeal Swab; Nasopharyngeal(NP) swabs in vial transport medium  Result Value Ref Range Status   SARS Coronavirus 2 by RT PCR NEGATIVE NEGATIVE Final    Comment: (NOTE) SARS-CoV-2 target nucleic acids are NOT DETECTED.  The SARS-CoV-2 RNA is generally detectable in upper respiratory specimens during the acute phase of infection. The lowest concentration of SARS-CoV-2 viral copies this assay can detect is 138 copies/mL. A negative result does not preclude SARS-Cov-2 infection and should not be used as the sole basis for treatment or other patient management  decisions. A negative result may occur with  improper specimen collection/handling, submission of specimen other than nasopharyngeal swab, presence of viral mutation(s) within the areas targeted by this assay, and inadequate number of viral copies(<138 copies/mL). A negative result must be combined with clinical observations, patient history, and epidemiological information. The expected result is Negative.  Fact Sheet for Patients:  BloggerCourse.com  Fact Sheet for Healthcare Providers:  SeriousBroker.it  This test is no t yet approved or cleared by the [place] FDA and  has been authorized for detection and/or diagnosis of SARS-CoV-2 by FDA under an Emergency Use Authorization (EUA). This EUA will remain  in effect (meaning this test can be used) for the duration of the COVID-19 declaration under Section 564(b)(1) of the Act, 21 U.S.C.section 360bbb-3(b)(1), unless the authorization is terminated  or revoked sooner.       Influenza A by PCR NEGATIVE NEGATIVE Final   Influenza B by PCR NEGATIVE NEGATIVE Final    Comment: (NOTE) The Xpert Xpress SARS-CoV-2/FLU/RSV plus assay is intended as an aid in the diagnosis of influenza from Nasopharyngeal swab specimens and should not be used as a sole basis for treatment. Nasal washings and aspirates are unacceptable for Xpert Xpress SARS-CoV-2/FLU/RSV testing.  Fact Sheet for Patients: BloggerCourse.com  Fact Sheet for Healthcare Providers: SeriousBroker.it  This test is not yet approved or cleared by the [place] and has been authorized for detection and/or diagnosis of SARS-CoV-2 by FDA under an Emergency Use Authorization (EUA). This EUA will remain in effect (meaning this test can be used) for the duration of the COVID-19 declaration under Section 564(b)(1) of the Act, 21 U.S.C. section 360bbb-3(b)(1), unless the  authorization is terminated or revoked.  Performed at [facility], [address], [place], [place] [address]   CSF culture     Status: None   Collection Time: 12/10/20  7:22 PM   Specimen: CSF; Cerebrospinal Fluid  Result Value Ref Range Status   Specimen Description   Final    CSF Performed at [facility], [address] [place], [place] [address]    Special Requests   Final    NONE Performed at [facility], [address] [place], [place] [address]    Gram Stain   Final    WBC PRESENT, PREDOMINANTLY MONONUCLEAR NO ORGANISMS SEEN CYTOSPIN SMEAR    Culture   Final    NO GROWTH Performed at [facility], [address] [place], [place] [address]    Report Status 12/14/2020 FINAL  Final     Labs: BNP (last 3 results) No results for input(s): BNP in the last 8760 hours. Basic Metabolic Panel: Recent Labs  Lab 12/10/20 1534 12/12/20 0219 12/13/20 0111  NA 139 136 134*  K 3.9 3.6 3.8  CL 103 104 102  CO2 29 26 21*  GLUCOSE 85 91 105*  BUN 14 11 14   CREATININE 1.00 1.03 1.01  CALCIUM 9.4 8.7* 8.9   Liver Function Tests: Recent Labs  Lab 12/10/20 1534  AST 22  ALT 24  ALKPHOS 66  BILITOT 0.5  PROT 7.6  ALBUMIN 4.5   No results for input(s): LIPASE, AMYLASE in the last 168 hours. No results for input(s): AMMONIA in the last 168 hours. CBC: Recent Labs  Lab 12/10/20 1534 12/12/20 0219 12/13/20 0111 12/14/20 0225  WBC 7.8 8.9 13.4* 10.3  NEUTROABS 4.8  --  8.4* 6.0  HGB 16.3 14.1 15.1 15.3  HCT 48.9 42.7 44.4 45.9  MCV 90.7 91.4 89.9 91.8  PLT 216 187 195 203   Cardiac Enzymes: No results for input(s): CKTOTAL, CKMB, CKMBINDEX, TROPONINI in the last 168 hours. BNP: Invalid input(s): POCBNP CBG: No results for input(s): GLUCAP in the last 168 hours. D-Dimer No results for input(s): DDIMER in the last 72 hours. Hgb A1c No results for input(s): HGBA1C in the last 72  hours. Lipid Profile No results for input(s): CHOL, HDL, LDLCALC, TRIG, CHOLHDL, LDLDIRECT in the last 72 hours. Thyroid function studies Recent Labs    12/11/20 2125  TSH 4.440   Anemia work up No results for input(s): VITAMINB12, FOLATE, FERRITIN, TIBC, IRON, RETICCTPCT in the last 72 hours. Urinalysis    Component Value Date/Time   COLORURINE YELLOW 12/10/2020 2025   APPEARANCEUR CLEAR 12/10/2020 2025   LABSPEC >1.046 (H) 12/10/2020 2025   PHURINE 6.0 12/10/2020 2025   GLUCOSEU NEGATIVE 12/10/2020 2025   HGBUR NEGATIVE 12/10/2020 2025   BILIRUBINUR NEGATIVE 12/10/2020 2025   KETONESUR NEGATIVE 12/10/2020 2025   PROTEINUR TRACE (A) 12/10/2020 2025   NITRITE NEGATIVE 12/10/2020 2025   LEUKOCYTESUR NEGATIVE 12/10/2020 2025   Sepsis Labs Invalid input(s): PROCALCITONIN,  WBC,  LACTICIDVEN Microbiology Recent Results (from the past 240 hour(s))  Resp Panel by RT-PCR (Flu A&B, Covid) Nasopharyngeal Swab  Status: None   Collection Time: 12/10/20  3:38 PM   Specimen: Nasopharyngeal Swab; Nasopharyngeal(NP) swabs in vial transport medium  Result Value Ref Range Status   SARS Coronavirus 2 by RT PCR NEGATIVE NEGATIVE Final    Comment: (NOTE) SARS-CoV-2 target nucleic acids are NOT DETECTED.  The SARS-CoV-2 RNA is generally detectable in upper respiratory specimens during the acute phase of infection. The lowest concentration of SARS-CoV-2 viral copies this assay can detect is 138 copies/mL. A negative result does not preclude SARS-Cov-2 infection and should not be used as the sole basis for treatment or other patient management decisions. A negative result may occur with  improper specimen collection/handling, submission of specimen other than nasopharyngeal swab, presence of viral mutation(s) within the areas targeted by this assay, and inadequate number of viral copies(<138 copies/mL). A negative result must be combined with clinical observations, patient history, and  epidemiological information. The expected result is Negative.  Fact Sheet for Patients:  BloggerCourse.com  Fact Sheet for Healthcare Providers:  SeriousBroker.it  This test is no t yet approved or cleared by the [place] FDA and  has been authorized for detection and/or diagnosis of SARS-CoV-2 by FDA under an Emergency Use Authorization (EUA). This EUA will remain  in effect (meaning this test can be used) for the duration of the COVID-19 declaration under Section 564(b)(1) of the Act, 21 U.S.C.section 360bbb-3(b)(1), unless the authorization is terminated  or revoked sooner.       Influenza A by PCR NEGATIVE NEGATIVE Final   Influenza B by PCR NEGATIVE NEGATIVE Final    Comment: (NOTE) The Xpert Xpress SARS-CoV-2/FLU/RSV plus assay is intended as an aid in the diagnosis of influenza from Nasopharyngeal swab specimens and should not be used as a sole basis for treatment. Nasal washings and aspirates are unacceptable for Xpert Xpress SARS-CoV-2/FLU/RSV testing.  Fact Sheet for Patients: BloggerCourse.com  Fact Sheet for Healthcare Providers: [email or web address]  This test is not yet approved or cleared by the [place] FDA and has been authorized for detection and/or diagnosis of SARS-CoV-2 by FDA under an Emergency Use Authorization (EUA). This EUA will remain in effect (meaning this test can be used) for the duration of the COVID-19 declaration under Section 564(b)(1) of the Act, 21 U.S.C. section 360bbb-3(b)(1), unless the authorization is terminated or revoked.  Performed at [facility], [address], [place], [place] [address]   CSF culture     Status: None   Collection Time: 12/10/20  7:22 PM   Specimen: CSF; Cerebrospinal Fluid  Result Value Ref Range Status   Specimen Description   Final    CSF Performed at [facility], [address] [place], [place] [address]    Special Requests   Final    NONE Performed at [facility], [address], [place], [place] [address]    Gram Stain   Final    WBC PRESENT, PREDOMINANTLY MONONUCLEAR NO ORGANISMS SEEN CYTOSPIN SMEAR    Culture   Final    NO GROWTH Performed at [facility], [address] [place], [place] [address]    Report Status 12/14/2020 FINAL  Final     Time coordinating discharge: Over 30 minutes  SIGNED:   Hughie Closs, MD  [facility] 12/14/2020, 3:35 PM  If 7PM-7AM, please contact night-coverage [email or web address]

## 2020-12-15 LAB — ANTIPHOSPHOLIPID SYNDROME EVAL, BLD
Anticardiolipin IgA: <9 APL U/mL (ref 0–11)
Anticardiolipin IgG: <9 GPL U/mL (ref 0–14)
Anticardiolipin IgM: <9 MPL U/mL (ref 0–12)
DRVVT: 38.7 s (ref 0.0–47.0)
PTT Lupus Anticoagulant: 36.6 s (ref 0.0–51.9)
Phosphatydalserine, IgA: 1 APS Units (ref 0–19)
Phosphatydalserine, IgG: 9 Units (ref 0–30)
Phosphatydalserine, IgM: <10 Units (ref 0–30)

## 2020-12-15 LAB — ROCKY MTN SPOTTED FVR ABS PNL(IGG+IGM)
RMSF IgG: POSITIVE — AB
RMSF IgM: 0.26 index (ref 0.00–0.89)

## 2020-12-15 LAB — RMSF, IGG, IFA: RMSF, IGG, IFA: <1:64 {titer}

## 2020-12-15 NOTE — Progress Notes (Signed)
Contacted via MyChart   Good afternoon Mahamud, this is Kunaal Walkins.  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   Keep being awesome!!  Thank you for allowing me to participate in your care.  I appreciate you. Kindest regards, Quention Mcneill

## 2020-12-19 ENCOUNTER — Telehealth: Payer: Self-pay

## 2020-12-19 NOTE — Telephone Encounter (Signed)
Transition Care Management Unsuccessful Follow-up Telephone Call  Date of discharge and from where:  12/14/2020  [facility]  Attempts:  2nd Attempt  Reason for unsuccessful TCM follow-up call:  No answer/busy   Rowe Pavy, RN, BSN, CEN [facility] Coordinator [phone]

## 2020-12-20 ENCOUNTER — Other Ambulatory Visit: Payer: Self-pay

## 2020-12-20 ENCOUNTER — Ambulatory Visit: Payer: BC Managed Care – PPO | Admitting: Nurse Practitioner

## 2020-12-20 ENCOUNTER — Encounter: Payer: Self-pay | Admitting: Nurse Practitioner

## 2020-12-20 VITALS — BP 109/78 | HR 87 | Temp 98.7°F | Resp 18 | Ht 70.0 in | Wt 225.0 lb

## 2020-12-20 DIAGNOSIS — E6609 Other obesity due to excess calories: Secondary | ICD-10-CM | POA: Diagnosis not present

## 2020-12-20 DIAGNOSIS — Z8619 Personal history of other infectious and parasitic diseases: Secondary | ICD-10-CM

## 2020-12-20 DIAGNOSIS — R42 Dizziness and giddiness: Secondary | ICD-10-CM | POA: Insufficient documentation

## 2020-12-20 DIAGNOSIS — G039 Meningitis, unspecified: Secondary | ICD-10-CM

## 2020-12-20 DIAGNOSIS — Z6832 Body mass index (BMI) 32.0-32.9, adult: Secondary | ICD-10-CM

## 2020-12-20 MED ORDER — METHYLPREDNISOLONE 4 MG PO TBPK: ORAL_TABLET | ORAL | 0 refills | Status: DC

## 2020-12-20 MED ORDER — MECLIZINE HCL 12.5 MG PO TABS: 12.5000 mg | ORAL_TABLET | Freq: Three times a day (TID) | ORAL | 0 refills | Status: DC | PRN

## 2020-12-20 MED ORDER — ONDANSETRON 4 MG PO TBDP: 4.0000 mg | ORAL_TABLET | Freq: Three times a day (TID) | ORAL | 0 refills | Status: DC | PRN

## 2020-12-20 MED ORDER — DOXYCYCLINE HYCLATE 100 MG PO TABS: 100.0000 mg | ORAL_TABLET | Freq: Two times a day (BID) | ORAL | 0 refills | Status: AC

## 2020-12-20 NOTE — Assessment & Plan Note (Addendum)
Ongoing, worsening post discharge.  ?sinusitis vs RMSF issues present.  At this time will start Doxycyline 100 MG BID x 14 days and start a Medrol pack -- have also sent in Meclizine and Zofran to use as needed for dizziness and nausea.  Urgent referral to neurology and ENT at [facility] or [facility] for further evaluation of this more complex case.  Labs obtained today.

## 2020-12-20 NOTE — Progress Notes (Signed)
BP 109/78   Pulse 87   Temp 98.7 F (37.1 C) (Oral)   Resp 18   Ht 5' 10"  (1.778 m)   Wt 225 lb (102.1 kg)   SpO2 97%   BMI 32.28 kg/m    Subjective:    Patient ID: Crews Mccollam, male    DOB: [birth year], 45 y.o.   MRN: [id number]  HPI: Norberto Wishon is a 45 y.o. male  Chief Complaint  Patient presents with   Hospitalization Follow-up   Transition of [facility] Follow up.  Follow-up for hospitalization.  Felt good on Thursday evening, Friday, Saturday -- then Sunday started feeling bad again.  Headaches (light when moves eyes around), dizziness, + constant ringing in ears (with nausea) + joint pain.  Was discharged with Augmentin on board.  Labs RMSF IgG positive + some labs with borderline findings.  Has history of RMSF meningitis and Lyme disease in 2016.  He is to follow-up with ENT -- has ENT in [facility].    "Brief/Interim Summary: Tarius Stangelo is a 45 y.o. male with medical history significant of RMSF-associated meningitis (2018) presenting with headache.  He was seen at [facility] last Wednesday and given Augmentin for apparent urinary infection.   He has had 6 doses of antibiotics.  On Friday, he developed headache behind his eyes.  The pain progressed through the weekend and he went to UC day prior and they recommended he go to the ER.  This pain was similar to when he had viral meningitis in the past.  No significant nasal congestion, but CT showed pansinusitis.  +photophobia/phonophobia.  Last COVID booster was 1 week ago.  Admitted to hospitalist service and neurology consulted.  Further hospitalization course as below.     Headache: Patient with h/o viral meningitis with RMSF + in 2018. Also with equivocal CSF result prior. Now with severe headache behind his eyes with photophobia/phonophobia, reports similar to prior headaches associated with meningitis. LP performed - not overly concerning for meningitis -Discussed with Dr. 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   MRI brain as well as MRV head with and without contrast unremarkable.  CT head once again unremarkable.  Just pansinusitis.  MRI orbits bilateral also negative.  Due to pansinusitis, ENT  consulted.  Per Dr. Marcelline Deist of ENT there is question of bony widening just anterior to cribriform plate.  He recommended repeat MRI of the brain with and without contrast and cisternogram looking for CSF leak and anterior cranial fossa.  Patient was started on ceftriaxone 2 g IV twice daily to cover sinus disease and possible meningitis of sinus etiology.  If no leak on the scan, switch to oral Augmentin and follow-up with ENT in 2 weeks.  Scan is done which does not show any CSF leak.  Neurology has discussed with ENT.  Neurology has personally seen the patient and has cleared the patient.  ENT recommended discharging patient on Augmentin twice daily for 4 days and he has follow-up with ENT as well.  Patient symptoms are completely resolved.  At this point in time, diagnosis remains uncertain."  [facility] Physician: Dr. Doristine Bosworth D/C Date: 12/14/20  Records Requested: 12/20/20 Records Received: 12/20/20 Records  Reviewed: 12/20/20  Diagnoses on Discharge: Headache behind the eyes  Date of interactive Contact within 48 hours of discharge:  Contact was through: phone  Date of 7 day or 14 day face-to-face visit:    within 7 days  Outpatient Encounter Medications as of 12/20/2020  Medication Sig   acetaminophen (TYLENOL) 325 MG tablet Take 650 mg by mouth every 6 (six) hours as needed for mild pain or fever.   doxycycline (VIBRA-TABS) 100 MG tablet Take 1 tablet (100 mg total) by mouth 2 (two)  times daily for 14 days.   FLUoxetine (PROZAC) 40 MG capsule TAKE 1 CAPSULE BY MOUTH EVERY DAY   meclizine (ANTIVERT) 12.5 MG tablet Take 1 tablet (12.5 mg total) by mouth 3 (three) times daily as needed for dizziness.   methylPREDNISolone (MEDROL DOSEPAK) 4 MG TBPK tablet Take as instructed on dose pack.   naproxen sodium (ALEVE) 220 MG tablet Take 440 mg by mouth 2 (two) times daily as needed (headach/pain).   ondansetron (ZOFRAN ODT) 4 MG disintegrating tablet Take 1 tablet (4 mg total) by mouth every 8 (eight) hours as needed for nausea or vomiting.   oxymetazoline (AFRIN) 0.05 % nasal spray Place 1 spray into both nostrils 2 (two) times daily as needed for congestion.   [DISCONTINUED] amoxicillin-clavulanate (AUGMENTIN) 875-125 MG tablet Take 1 tablet by mouth 2 (two) times daily for 14 days.   No facility-administered encounter medications on file as of 12/20/2020.    Diagnostic Tests Reviewed/Disposition: reviewed as on chart  Consults: ENT and neurology in hospital  Discharge Instructions: Follow-up with ENT  Disease/illness Education: Discussed RMSF and meningitis  Home Health/Community Services Discussions/Referrals: none  Establishment or re-establishment of referral orders for community resources: urgent ENT and neurology  Discussion with other health care providers: none  Assessment and Support of treatment regimen adherence: reviewed with patient  Appointments Coordinated with: reviewed with patient  Education for self-management, independent living, and ADLs:  reviewed with patient  Relevant past medical, surgical, family and social history reviewed and updated as indicated. Interim medical history since our last visit reviewed. Allergies and medications reviewed and updated.  Review of Systems  Constitutional:  Positive for fatigue. Negative for activity change, appetite change, diaphoresis and fever.  HENT:  Positive for sinus pressure and tinnitus. Negative for  congestion, postnasal drip, rhinorrhea, sinus pain, sore throat and voice change.   Respiratory:  Negative for cough, chest tightness, shortness of breath and wheezing.   Cardiovascular:  Negative for chest pain, palpitations and leg swelling.  Gastrointestinal: Negative.   Musculoskeletal:  Positive for myalgias.  Neurological:  Positive for dizziness and headaches. Negative for syncope, speech difficulty, weakness, light-headedness and numbness.  Psychiatric/Behavioral: Negative.     Per HPI unless specifically indicated above     Objective:    BP 109/78   Pulse 87   Temp 98.7 F (37.1 C) (Oral)   Resp 18   Ht 5' 10"  (1.778 m)   Wt 225 lb (102.1 kg)   SpO2 97%   BMI 32.28 kg/m   Wt Readings from Last 3 Encounters:  12/20/20 225 lb (102.1 kg)  12/10/20 225 lb (102.1 kg)  07/05/19 246 lb 3.2 oz (111.7 kg)    Physical Exam Vitals and nursing note reviewed.  Constitutional:      General: He is awake. He is not in acute distress.    Appearance: He is well-developed and well-groomed. He is obese. He is ill-appearing. He is not toxic-appearing.  HENT:  Head: Normocephalic and atraumatic.     Right Ear: Hearing, ear canal and external ear normal. No drainage. A middle ear effusion is present.     Left Ear: Hearing, ear canal and external ear normal. No drainage. A middle ear effusion is present.     Nose: No rhinorrhea.     Right Turbinates: Swollen.     Left Turbinates: Swollen.     Right Sinus: Maxillary sinus tenderness present. No frontal sinus tenderness.     Left Sinus: Maxillary sinus tenderness present. No frontal sinus tenderness.     Mouth/Throat:     Mouth: Mucous membranes are moist.     Pharynx: Oropharynx is clear. Uvula midline. No posterior oropharyngeal erythema.  Eyes:     General: Lids are normal.        Right eye: No discharge.        Left eye: No discharge.     Extraocular Movements: Extraocular movements intact.     Conjunctiva/sclera: Conjunctivae  normal.     Pupils: Pupils are equal, round, and reactive to light.     Visual Fields: Right eye visual fields normal and left eye visual fields normal.     Comments: Sensitivity to light with exam, this caused dizziness.  Neck:     Thyroid: No thyromegaly.     Vascular: No carotid bruit.  Cardiovascular:     Rate and Rhythm: Normal rate and regular rhythm.     Heart sounds: Normal heart sounds, S1 normal and S2 normal. No murmur heard.   No gallop.  Pulmonary:     Effort: Pulmonary effort is normal. No accessory muscle usage or respiratory distress.     Breath sounds: Normal breath sounds.  Abdominal:     General: Bowel sounds are normal.     Palpations: Abdomen is soft. There is no hepatomegaly or splenomegaly.  Musculoskeletal:        General: Normal range of motion.     Cervical back: Normal range of motion and neck supple.     Right lower leg: No edema.     Left lower leg: No edema.  Lymphadenopathy:     Head:     Right side of head: No submental, submandibular, tonsillar, preauricular or posterior auricular adenopathy.     Left side of head: No submental, submandibular, tonsillar, preauricular or posterior auricular adenopathy.     Cervical: No cervical adenopathy.  Skin:    General: Skin is warm and dry.     Capillary Refill: Capillary refill takes less than 2 seconds.     Findings: No rash.  Neurological:     Mental Status: He is alert and oriented to person, place, and time.     Cranial Nerves: Cranial nerves are intact.     Coordination: Coordination is intact.     Deep Tendon Reflexes: Reflexes are normal and symmetric.     Reflex Scores:      Brachioradialis reflexes are 2+ on the right side and 2+ on the left side.      Patellar reflexes are 2+ on the right side and 2+ on the left side. Psychiatric:        Attention and Perception: Attention normal.        Mood and Affect: Mood normal.        Speech: Speech normal.        Behavior: Behavior normal. Behavior is  cooperative.        Thought Content: Thought content normal.  Results for orders placed or performed during the hospital encounter of 12/10/20  Resp Panel by RT-PCR (Flu A&B, Covid) Nasopharyngeal Swab   Specimen: Nasopharyngeal Swab; Nasopharyngeal(NP) swabs in vial transport medium  Result Value Ref Range   SARS Coronavirus 2 by RT PCR NEGATIVE NEGATIVE   Influenza A by PCR NEGATIVE NEGATIVE   Influenza B by PCR NEGATIVE NEGATIVE  CSF culture   Specimen: CSF; Cerebrospinal Fluid  Result Value Ref Range   Specimen Description      CSF Performed at [facility], [address], [place], [place] [address]    Special Requests      NONE Performed at [facility], [place], [place] [address]    Gram Stain      WBC PRESENT, PREDOMINANTLY MONONUCLEAR NO ORGANISMS SEEN CYTOSPIN SMEAR    Culture      NO GROWTH Performed at [facility], [place] [address], [place], [place] [address]    Report Status 12/14/2020 FINAL   Comprehensive metabolic panel  Result Value Ref Range   Sodium 139 135 - 145 mmol/L   Potassium 3.9 3.5 - 5.1 mmol/L   Chloride 103 98 - 111 mmol/L   CO2 29 22 - 32 mmol/L   Glucose, Bld 85 70 - 99 mg/dL   BUN 14 6 - 20 mg/dL   Creatinine, Ser 1.00 0.61 - 1.24 mg/dL   Calcium 9.4 8.9 - 10.3 mg/dL   Total Protein 7.6 6.5 - 8.1 g/dL   Albumin 4.5 3.5 - 5.0 g/dL   AST 22 15 - 41 U/L   ALT 24 0 - 44 U/L   Alkaline Phosphatase 66 38 - 126 U/L   Total Bilirubin 0.5 0.3 - 1.2 mg/dL   GFR, Estimated >60 >60 mL/min   Anion gap 7 5 - 15  CBC with Differential  Result Value Ref Range   WBC 7.8 4.0 - 10.5 K/uL   RBC 5.39 4.22 - 5.81 MIL/uL   Hemoglobin 16.3 13.0 - 17.0 g/dL   HCT 48.9 39.0 - 52.0 %   MCV 90.7 80.0 - 100.0 fL   MCH 30.2 26.0 - 34.0 pg   MCHC 33.3 30.0 - 36.0 g/dL   RDW 13.2 11.5 - 15.5 %   Platelets 216 150 - 400 K/uL   nRBC 0.0 0.0 - 0.2 %   Neutrophils Relative % 61 %   Neutro Abs 4.8  1.7 - 7.7 K/uL   Lymphocytes Relative 24 %   Lymphs Abs 1.8 0.7 - 4.0 K/uL   Monocytes Relative 8 %   Monocytes Absolute 0.6 0.1 - 1.0 K/uL   Eosinophils Relative 6 %   Eosinophils Absolute 0.5 0.0 - 0.5 K/uL   Basophils Relative 1 %   Basophils Absolute 0.1 0.0 - 0.1 K/uL   Immature Granulocytes 0 %   Abs Immature Granulocytes 0.01 0.00 - 0.07 K/uL  Urinalysis, Routine w reflex microscopic Urine, Clean Catch  Result Value Ref Range   Color, Urine YELLOW YELLOW   APPearance CLEAR CLEAR   Specific Gravity, Urine >1.046 (H) 1.005 - 1.030   pH 6.0 5.0 - 8.0   Glucose, UA NEGATIVE NEGATIVE mg/dL   Hgb urine dipstick NEGATIVE NEGATIVE   Bilirubin Urine NEGATIVE NEGATIVE   Ketones, ur NEGATIVE NEGATIVE mg/dL   Protein, ur TRACE (A) NEGATIVE mg/dL   Nitrite NEGATIVE NEGATIVE   Leukocytes,Ua NEGATIVE NEGATIVE   RBC / HPF 0-5 0 - 5 RBC/hpf   WBC, UA 0-5 0 -  5 WBC/hpf   Mucus PRESENT   Sedimentation rate  Result Value Ref Range   Sed Rate 1 0 - 16 mm/hr  C-reactive protein  Result Value Ref Range   CRP 1.0 (H) <1.0 mg/dL  CSF cell count with differential collection tube #: 1  Result Value Ref Range   Tube # 1    Color, CSF PINK (A) COLORLESS   Appearance, CSF HAZY (A) CLEAR   Supernatant COLORLESS    RBC Count, CSF 186 (H) 0 /cu mm   WBC, CSF 9 (H) 0 - 5 /cu mm   Segmented Neutrophils-CSF 15 (H) 0 - 6 %   Lymphs, CSF 73 40 - 80 %   Monocyte-Macrophage-Spinal Fluid 11 (L) 15 - 45 %   Eosinophils, CSF 1 0 - 1 %  CSF cell count with differential collection tube #: 4  Result Value Ref Range   Tube # 4    Color, CSF COLORLESS COLORLESS   Appearance, CSF CLEAR CLEAR   Supernatant NOT INDICATED    RBC Count, CSF 12 (H) 0 /cu mm   WBC, CSF 13 (HH) 0 - 5 /cu mm   Segmented Neutrophils-CSF 0 0 - 6 %   Lymphs, CSF 94 (H) 40 - 80 %   Monocyte-Macrophage-Spinal Fluid 6 (L) 15 - 45 %   Eosinophils, CSF 0 0 - 1 %  Glucose, CSF  Result Value Ref Range   Glucose, CSF 56 40 - 70 mg/dL   Protein, CSF  Result Value Ref Range   Total  Protein, CSF 64 (H) 15 - 45 mg/dL  HIV Antibody (routine testing w rflx)  Result Value Ref Range   HIV Screen 4th Generation wRfx Non Reactive Non Reactive  Pathologist smear review  Result Value Ref Range   Path Review Increased mononuclear cells.   Basic metabolic panel  Result Value Ref Range   Sodium 136 135 - 145 mmol/L   Potassium 3.6 3.5 - 5.1 mmol/L   Chloride 104 98 - 111 mmol/L   CO2 26 22 - 32 mmol/L   Glucose, Bld 91 70 - 99 mg/dL   BUN 11 6 - 20 mg/dL   Creatinine, Ser 1.03 0.61 - 1.24 mg/dL   Calcium 8.7 (L) 8.9 - 10.3 mg/dL   GFR, Estimated >60 >60 mL/min   Anion gap 6 5 - 15  CBC  Result Value Ref Range   WBC 8.9 4.0 - 10.5 K/uL   RBC 4.67 4.22 - 5.81 MIL/uL   Hemoglobin 14.1 13.0 - 17.0 g/dL   HCT 42.7 39.0 - 52.0 %   MCV 91.4 80.0 - 100.0 fL   MCH 30.2 26.0 - 34.0 pg   MCHC 33.0 30.0 - 36.0 g/dL   RDW 12.8 11.5 - 15.5 %   Platelets 187 150 - 400 K/uL   nRBC 0.0 0.0 - 0.2 %  Lyme Disease Serology w/Reflex  Result Value Ref Range   Lyme Total Antibody EIA Negative Negative  Rocky mtn spotted fvr abs pnl(IgG+IgM)  Result Value Ref Range   RMSF IgG Positive (A) Negative   RMSF IgM 0.26 0.00 - 0.89 index  ANA w/Reflex if Positive  Result Value Ref Range   Anti Nuclear Antibody (ANA) Positive (A) Negative  Rheumatoid factor  Result Value Ref Range   Rhuematoid fact SerPl-aCnc 11.6 <14.0 IU/mL  ANCA Profile  Result Value Ref Range   Anti-MPO Antibodies <0.2 0.0 - 0.9 units   Anti-PR3 Antibodies <0.2 0.0 - 0.9 units  C-ANCA <1:20 Neg:<1:20 titer   P-ANCA <1:20 Neg:<1:20 titer   Atypical P-ANCA titer <1:20 Neg:<1:20 titer  Thyroid antibodies  Result Value Ref Range   Thyroperoxidase Ab SerPl-aCnc <8 0 - 34 IU/mL   Thyroglobulin Antibody <1.0 0.0 - 0.9 IU/mL  Anti-DNA antibody, double-stranded  Result Value Ref Range   ds DNA Ab 3 0 - 9 IU/mL  Antiphospholipid syndrome eval, bld  Result Value Ref  Range   Anticardiolipin IgA <9 0 - 11 APL U/mL   Anticardiolipin IgG <9 0 - 14 GPL U/mL   Anticardiolipin IgM <9 0 - 12 MPL U/mL   PTT Lupus Anticoagulant 36.6 0.0 - 51.9 sec   DRVVT 38.7 0.0 - 47.0 sec   Phosphatydalserine, IgG 9 0 - 30 Units   Phosphatydalserine, IgM <10 0 - 30 Units   Phosphatydalserine, IgA 1 0 - 19 APS Units   Lupus Anticoag Interp Comment:   C3 complement  Result Value Ref Range   C3 Complement 137 82 - 167 mg/dL  C4 complement  Result Value Ref Range   Complement C4, Body Fluid 27 12 - 38 mg/dL  Protein / creatinine ratio, urine  Result Value Ref Range   Creatinine, Urine 214.14 mg/dL   Total Protein, Urine 7 mg/dL   Protein Creatinine Ratio 0.03 0.00 - 0.15 mg/mg[Cre]  Anti-Smith antibody  Result Value Ref Range   ENA SM Ab Ser-aCnc <0.2 0.0 - 0.9 AI  CYCLIC CITRUL PEPTIDE ANTIBODY, IGG/IGA  Result Value Ref Range   CCP Antibodies IgG/IgA 2 0 - 19 units  TSH  Result Value Ref Range   TSH 4.440 0.350 - 4.500 uIU/mL  RPR  Result Value Ref Range   RPR Ser Ql NON REACTIVE NON REACTIVE  IgG 1, 2, 3, and 4  Result Value Ref Range   IgG (Immunoglobin G), Serum 1,002 603 - 1,613 mg/dL   IgG, Subclass 1 541 248 - 810 mg/dL   IgG, Subclass 2 343 130 - 555 mg/dL   IgG, Subclass 3 36 15 - 102 mg/dL   IgG, Subclass 4 57 2 - 96 mg/dL  Sjogrens syndrome-A extractable nuclear antibody  Result Value Ref Range   SSA (Ro) (ENA) Antibody, IgG <0.2 0.0 - 0.9 AI  Sjogrens syndrome-B extractable nuclear antibody  Result Value Ref Range   SSB (La) (ENA) Antibody, IgG <0.2 0.0 - 0.9 AI  Basic metabolic panel  Result Value Ref Range   Sodium 134 (L) 135 - 145 mmol/L   Potassium 3.8 3.5 - 5.1 mmol/L   Chloride 102 98 - 111 mmol/L   CO2 21 (L) 22 - 32 mmol/L   Glucose, Bld 105 (H) 70 - 99 mg/dL   BUN 14 6 - 20 mg/dL   Creatinine, Ser 1.01 0.61 - 1.24 mg/dL   Calcium 8.9 8.9 - 10.3 mg/dL   GFR, Estimated >60 >60 mL/min   Anion gap 11 5 - 15  CBC with  Differential/Platelet  Result Value Ref Range   WBC 13.4 (H) 4.0 - 10.5 K/uL   RBC 4.94 4.22 - 5.81 MIL/uL   Hemoglobin 15.1 13.0 - 17.0 g/dL   HCT 44.4 39.0 - 52.0 %   MCV 89.9 80.0 - 100.0 fL   MCH 30.6 26.0 - 34.0 pg   MCHC 34.0 30.0 - 36.0 g/dL   RDW 12.6 11.5 - 15.5 %   Platelets 195 150 - 400 K/uL   nRBC 0.0 0.0 - 0.2 %   Neutrophils Relative % 63 %  Neutro Abs 8.4 (H) 1.7 - 7.7 K/uL   Lymphocytes Relative 27 %   Lymphs Abs 3.7 0.7 - 4.0 K/uL   Monocytes Relative 6 %   Monocytes Absolute 0.8 0.1 - 1.0 K/uL   Eosinophils Relative 4 %   Eosinophils Absolute 0.5 0.0 - 0.5 K/uL   Basophils Relative 0 %   Basophils Absolute 0.1 0.0 - 0.1 K/uL   Immature Granulocytes 0 %   Abs Immature Granulocytes 0.05 0.00 - 0.07 K/uL  Angiotensin converting enzyme  Result Value Ref Range   Angiotensin-Converting Enzyme 45 14 - 82 U/L  ENA+DNA/DS+ANTICH+CENTRO+JO...  Result Value Ref Range   ds DNA Ab 3 0 - 9 IU/mL   Ribonucleic Protein 1.8 (H) 0.0 - 0.9 AI   ENA SM Ab Ser-aCnc <0.2 0.0 - 0.9 AI   Scleroderma (Scl-70) (ENA) Antibody, IgG <0.2 0.0 - 0.9 AI   SSA (Ro) (ENA) Antibody, IgG <0.2 0.0 - 0.9 AI   SSB (La) (ENA) Antibody, IgG <0.2 0.0 - 0.9 AI   Chromatin Ab SerPl-aCnc <0.2 0.0 - 0.9 AI   Anti JO-1 <0.2 0.0 - 0.9 AI   Centromere Ab Screen <0.2 0.0 - 0.9 AI   See below: Comment   CBC with Differential/Platelet  Result Value Ref Range   WBC 10.3 4.0 - 10.5 K/uL   RBC 5.00 4.22 - 5.81 MIL/uL   Hemoglobin 15.3 13.0 - 17.0 g/dL   HCT 45.9 39.0 - 52.0 %   MCV 91.8 80.0 - 100.0 fL   MCH 30.6 26.0 - 34.0 pg   MCHC 33.3 30.0 - 36.0 g/dL   RDW 12.9 11.5 - 15.5 %   Platelets 203 150 - 400 K/uL   nRBC 0.0 0.0 - 0.2 %   Neutrophils Relative % 57 %   Neutro Abs 6.0 1.7 - 7.7 K/uL   Lymphocytes Relative 30 %   Lymphs Abs 3.1 0.7 - 4.0 K/uL   Monocytes Relative 7 %   Monocytes Absolute 0.7 0.1 - 1.0 K/uL   Eosinophils Relative 5 %   Eosinophils Absolute 0.5 0.0 - 0.5 K/uL    Basophils Relative 1 %   Basophils Absolute 0.1 0.0 - 0.1 K/uL   Immature Granulocytes 0 %   Abs Immature Granulocytes 0.04 0.00 - 0.07 K/uL  RMSF, IgG, IFA  Result Value Ref Range   RMSF, IGG, IFA <1:64 Neg <1:64  Troponin I (High Sensitivity)  Result Value Ref Range   Troponin I (High Sensitivity) 3 <18 ng/L  Troponin I (High Sensitivity)  Result Value Ref Range   Troponin I (High Sensitivity) 2 <18 ng/L      Assessment & Plan:   Problem List Items Addressed This Visit       Nervous and Auditory   Meningitis    History of meningitis post RMSF years ago, currently discharged with Augmentin which is almost completed.  Concern for this time due to RMSF-like symptoms, which are not improving.  Will stop Augmentin and start Doxycycline which will cover both for RMSF and possible sinus issues present.  Urgent referral to neurology and ENT for further evaluation of this more complex case.        Other   History of [place] spotted fever - Primary    Refer to dizziness plan of care.      Relevant Orders   CBC with Differential/Platelet   Comprehensive metabolic panel   Sed Rate (ESR)   C-reactive protein   Ambulatory referral to ENT  Ambulatory referral to Neurology   Obesity    BMI 32.28.  Recommended eating smaller high protein, low fat meals more frequently and exercising 30 mins a day 5 times a week with a goal of 10-15lb weight loss in the next 3 months. Patient voiced their understanding and motivation to adhere to these recommendations.       Dizziness    Ongoing, worsening post discharge.  ?sinusitis vs RMSF issues present.  At this time will start Doxycyline 100 MG BID x 14 days and start a Medrol pack -- have also sent in Meclizine and Zofran to use as needed for dizziness and nausea.  Urgent referral to neurology and ENT at [facility] or [facility] for further evaluation of this more complex case.  Labs obtained today.      Relevant Orders   Ambulatory referral to ENT    Ambulatory referral to Neurology     Follow up plan: Return in about 1 week (around 12/27/2020) for RMSF follow-up.

## 2020-12-20 NOTE — Assessment & Plan Note (Signed)
History of meningitis post RMSF years ago, currently discharged with Augmentin which is almost completed.  Concern for this time due to RMSF-like symptoms, which are not improving.  Will stop Augmentin and start Doxycycline which will cover both for RMSF and possible sinus issues present.  Urgent referral to neurology and ENT for further evaluation of this more complex case.

## 2020-12-20 NOTE — Assessment & Plan Note (Signed)
BMI 32.28.  Recommended eating smaller high protein, low fat meals more frequently and exercising 30 mins a day 5 times a week with a goal of 10-15lb weight loss in the next 3 months. Patient voiced their understanding and motivation to adhere to these recommendations.  

## 2020-12-20 NOTE — Assessment & Plan Note (Signed)
Refer to dizziness plan of care.

## 2020-12-20 NOTE — Patient Instructions (Signed)
[place] Spotted Fever [place] spotted fever is an infection caused by bacteria that spreads to people through the bite of certain ticks. The illness causes flu-like symptoms and a reddish-purple rash. The illness does not spread from person to person (is not contagious). When this condition is not treated right away, it can quickly become very serious. It can sometimes lead to long-term (chronic) health problems and can be life-threatening. What are the causes? This condition is caused by a type of bacteria (Rickettsia rickettsii) that is carried by American dog ticks, brown dog ticks, and [place] wood ticks. The infection spreads through: A bite from an infected tick. Tick bites are usually painless, and they frequently are not noticed. Infected tick blood, body fluids, or feces that get into the body through damaged skin, such as a small cut or sore. This could happen while removing a tick from a pet or from another person. What increases the risk? The following factors may make you more likely to develop this condition: Spending a lot of time outdoors, especially in rural areas or areas with long grass or woods with high brush. Spending time outdoors during warm weather. Ticks are most active during warm weather. What are the signs or symptoms? Symptoms of this condition include: Fever. Headache. Eye redness or sensitivity to light. There may also be swelling around the eyes. A reddish-purple rash. This usually appears 2-4 days after the first symptoms begin. The rash often starts on the wrists, forearms, and ankles. It may then spread to the palms, the bottom of the feet, legs, and trunk. Muscle aches. Nausea or vomiting. Poor appetite. Abdominal pain. Symptoms may develop 2-14 days after a tick bite. How is this diagnosed? This condition is diagnosed based on: Your medical history. A physical exam. Blood tests. Whether you have recently been bitten by a tick or  spent time in areas where: Ticks are common. [place] spotted fever is common. You may also have a skin biopsy if you have a rash. A skin biopsy is a procedure to remove a sample of your skin so that it can be checked under a microscope. How is this treated? This condition is treated with antibiotic medicines. It is important to begin treatment right away. In some cases, your health care provider may begin treatment before the diagnosis is confirmed. If your symptoms are severe, you may need to be treated in the hospital where you can get antibiotics and be monitored during treatment. Follow these instructions at home: Take over-the-counter and prescription medicines only as told by your health care provider. Take your antibiotic medicine as told by your health care provider. Do not stop taking the antibiotic even if you start to feel better. Rest as much as possible until you feel better. Return to your normal activities as told by your health care provider. Ask your health care provider what activities are safe for you. Drink enough fluid to keep your urine pale yellow. Keep all follow-up visits. This is important. Contact a health care provider if: You have a rash that gets more red or swollen. You have fluid draining from any areas of your rash. You develop a fever after being bitten by a tick. You develop a rash 2-4 days after experiencing flu-like symptoms. You bruise easily. You have bleeding from your gums. You have numbness or tingling in your arms or legs. Get help right away if: You have chest pain. You have shortness of breath. You have a severe headache.   You have vision or hearing problems. You feel confused. You have a seizure. You have severe abdominal pain. You have blood in your stool (feces). You are not able to control when you urinate or have bowel movements (incontinence). These symptoms may represent a serious problem that is an emergency. Do not wait to see  if the symptoms will go away. Get medical help right away. Call your local emergency services (911 in the [place] Do not drive yourself to the hospital. Summary [facility] spotted fever is a bacterial infection that spreads to people through contact with certain ticks. When this condition is not treated right away, it can quickly become very serious. Sometimes, it can lead to long-term (chronic) health problems and can be life-threatening. You are more likely to develop this infection if you spend time outdoors in warm weather and in areas with tall grass or brush. Symptoms of this condition include fever, headache, nausea, vomiting, abdominal pain, muscle aches, and a reddish-purple rash that usually appears 2-4 days after a fever. This condition is treated with antibiotic medicines. This information is not intended to replace advice given to you by your health care provider. Make sure you discuss any questions you have with your health care provider. Document Revised: 02/05/2020 Document Reviewed: 02/05/2020 Elsevier Patient Education  2022 [employer].

## 2020-12-21 LAB — COMPREHENSIVE METABOLIC PANEL
ALT: 23 IU/L (ref 0–44)
AST: 17 IU/L (ref 0–40)
Albumin/Globulin Ratio: 1.6 (ref 1.2–2.2)
Albumin: 4.6 g/dL (ref 4.0–5.0)
Alkaline Phosphatase: 79 IU/L (ref 44–121)
BUN/Creatinine Ratio: 17 (ref 9–20)
BUN: 18 mg/dL (ref 6–24)
Bilirubin Total: 0.6 mg/dL (ref 0.0–1.2)
CO2: 23 mmol/L (ref 20–29)
Calcium: 9.8 mg/dL (ref 8.7–10.2)
Chloride: 102 mmol/L (ref 96–106)
Creatinine, Ser: 1.03 mg/dL (ref 0.76–1.27)
Globulin, Total: 2.9 g/dL (ref 1.5–4.5)
Glucose: 76 mg/dL (ref 70–99)
Potassium: 4.3 mmol/L (ref 3.5–5.2)
Sodium: 140 mmol/L (ref 134–144)
Total Protein: 7.5 g/dL (ref 6.0–8.5)
eGFR: 92 mL/min/{1.73_m2} (ref >59)

## 2020-12-21 LAB — CBC WITH DIFFERENTIAL/PLATELET
Basophils Absolute: 0.1 10*3/uL (ref 0.0–0.2)
Basos: 1 %
EOS (ABSOLUTE): 0.7 10*3/uL — ABNORMAL HIGH (ref 0.0–0.4)
Eos: 7 %
Hematocrit: 49.8 % (ref 37.5–51.0)
Hemoglobin: 16.6 g/dL (ref 13.0–17.7)
Immature Grans (Abs): 0 10*3/uL (ref 0.0–0.1)
Immature Granulocytes: 0 %
Lymphocytes Absolute: 1.8 10*3/uL (ref 0.7–3.1)
Lymphs: 19 %
MCH: 30 pg (ref 26.6–33.0)
MCHC: 33.3 g/dL (ref 31.5–35.7)
MCV: 90 fL (ref 79–97)
Monocytes Absolute: 0.6 10*3/uL (ref 0.1–0.9)
Monocytes: 7 %
Neutrophils Absolute: 6 10*3/uL (ref 1.4–7.0)
Neutrophils: 66 %
Platelets: 236 10*3/uL (ref 150–450)
RBC: 5.54 x10E6/uL (ref 4.14–5.80)
RDW: 12.6 % (ref 11.6–15.4)
WBC: 9.1 10*3/uL (ref 3.4–10.8)

## 2020-12-21 LAB — SEDIMENTATION RATE: Sed Rate: 16 mm/hr — ABNORMAL HIGH (ref 0–15)

## 2020-12-21 LAB — C-REACTIVE PROTEIN: CRP: 3 mg/L (ref 0–10)

## 2020-12-21 NOTE — Progress Notes (Signed)
Contacted via MyChart

## 2020-12-28 NOTE — Telephone Encounter (Signed)
Toniann Fail, with [facility] neurology clinic called to say that they had to decline the referral and pt needs to see ENT first.  CB# [phone]

## 2021-01-01 ENCOUNTER — Encounter: Payer: Self-pay | Admitting: Nurse Practitioner

## 2021-01-01 ENCOUNTER — Other Ambulatory Visit: Payer: Self-pay

## 2021-01-01 ENCOUNTER — Ambulatory Visit: Payer: BC Managed Care – PPO | Admitting: Nurse Practitioner

## 2021-01-01 VITALS — BP 100/68 | HR 87 | Temp 98.5°F | Wt 223.2 lb

## 2021-01-01 DIAGNOSIS — Z23 Encounter for immunization: Secondary | ICD-10-CM | POA: Diagnosis not present

## 2021-01-01 DIAGNOSIS — R42 Dizziness and giddiness: Secondary | ICD-10-CM | POA: Diagnosis not present

## 2021-01-01 NOTE — Assessment & Plan Note (Signed)
Acute and improved.  No further symptoms.  Recommend complete Doxycycline regimen.  Is scheduled to see ENT upcoming, will review note after visit.

## 2021-01-01 NOTE — Progress Notes (Signed)
BP 100/68   Pulse 87   Temp 98.5 F (36.9 C) (Oral)   Wt 223 lb 3.2 oz (101.2 kg)   SpO2 97%   BMI 32.03 kg/m    Subjective:    Patient ID: Jon Saunders, male    DOB: Apr 30, 1975, 45 y.o.   MRN: [id number]  HPI: Jon Saunders is a 45 y.o. male  Chief Complaint  Patient presents with   Follow-up    Patient states he has no more pain nor dizziness. Patient states about a week ago Saturday he wasn't having any more pain and dizziness. Patient denies having any fever, headaches or coughing. Patient states he feels a lot better.    DIZZINESS Followed up with patient on 12/20/20 after hospital admission for headache and dizziness in patient with history of RMSF meninginitis.  Recent visit was still having symptoms and started on Doxycycline labs RMSF IgG positive + some labs with borderline findings.  Reports 2 days after starting Doxycycline had no further headache, then the following Tuesday felt 100% better.  Is scheduled to see ENT at [facility] 01/17/21. Duration:  improved Recent head injury: no Recent or current viral symptoms: no History of vasovagal episodes: no Nausea: no Vomiting: no Tinnitus: no Hearing loss: no Aural fullness: no Headache: no Photophobia/phonophobia: no Unsteady gait: no Postural instability: no Diplopia, dysarthria, dysphagia or weakness: no Related to exertion: no Pallor: no Diaphoresis: no Dyspnea: no Chest pain: no   Relevant past medical, surgical, family and social history reviewed and updated as indicated. Interim medical history since our last visit reviewed. Allergies and medications reviewed and updated.  Review of Systems  Constitutional:  Negative for activity change, appetite change, diaphoresis, fatigue and fever.  HENT: Negative.    Respiratory:  Negative for cough, chest tightness, shortness of breath and wheezing.   Cardiovascular:  Negative for chest pain, palpitations and leg swelling.  Gastrointestinal: Negative.    Musculoskeletal:  Negative for myalgias.  Neurological:  Negative for dizziness, syncope, speech difficulty, weakness, light-headedness, numbness and headaches.  Psychiatric/Behavioral: Negative.     Per HPI unless specifically indicated above     Objective:    BP 100/68   Pulse 87   Temp 98.5 F (36.9 C) (Oral)   Wt 223 lb 3.2 oz (101.2 kg)   SpO2 97%   BMI 32.03 kg/m   Wt Readings from Last 3 Encounters:  01/01/21 223 lb 3.2 oz (101.2 kg)  12/20/20 225 lb (102.1 kg)  12/10/20 225 lb (102.1 kg)    Physical Exam Vitals and nursing note reviewed.  Constitutional:      General: He is awake. He is not in acute distress.    Appearance: He is well-developed and well-groomed. He is obese. He is not ill-appearing or toxic-appearing.  HENT:     Head: Normocephalic and atraumatic.     Right Ear: Hearing, ear canal and external ear normal. No drainage. No middle ear effusion.     Left Ear: Hearing, ear canal and external ear normal. No drainage.  No middle ear effusion.     Nose: No rhinorrhea.     Right Turbinates: Not swollen.     Left Turbinates: Not swollen.     Right Sinus: No maxillary sinus tenderness or frontal sinus tenderness.     Left Sinus: No maxillary sinus tenderness or frontal sinus tenderness.     Mouth/Throat:     Mouth: Mucous membranes are moist.     Pharynx: Oropharynx is clear. Uvula midline. No  posterior oropharyngeal erythema.  Eyes:     General: Lids are normal.        Right eye: No discharge.        Left eye: No discharge.     Extraocular Movements: Extraocular movements intact.     Conjunctiva/sclera: Conjunctivae normal.     Pupils: Pupils are equal, round, and reactive to light.     Visual Fields: Right eye visual fields normal and left eye visual fields normal.  Neck:     Thyroid: No thyromegaly.     Vascular: No carotid bruit.  Cardiovascular:     Rate and Rhythm: Normal rate and regular rhythm.     Heart sounds: Normal heart sounds, S1  normal and S2 normal. No murmur heard.   No gallop.  Pulmonary:     Effort: Pulmonary effort is normal. No accessory muscle usage or respiratory distress.     Breath sounds: Normal breath sounds.  Abdominal:     General: Bowel sounds are normal.     Palpations: Abdomen is soft. There is no hepatomegaly or splenomegaly.  Musculoskeletal:        General: Normal range of motion.     Cervical back: Normal range of motion and neck supple.     Right lower leg: No edema.     Left lower leg: No edema.  Lymphadenopathy:     Head:     Right side of head: No submental, submandibular, tonsillar, preauricular or posterior auricular adenopathy.     Left side of head: No submental, submandibular, tonsillar, preauricular or posterior auricular adenopathy.     Cervical: No cervical adenopathy.  Skin:    General: Skin is warm and dry.     Capillary Refill: Capillary refill takes less than 2 seconds.     Findings: No rash.  Neurological:     Mental Status: He is alert and oriented to person, place, and time.     Coordination: Coordination is intact.     Deep Tendon Reflexes: Reflexes are normal and symmetric.     Reflex Scores:      Brachioradialis reflexes are 2+ on the right side and 2+ on the left side.      Patellar reflexes are 2+ on the right side and 2+ on the left side. Psychiatric:        Attention and Perception: Attention normal.        Mood and Affect: Mood normal.        Speech: Speech normal.        Behavior: Behavior normal. Behavior is cooperative.        Thought Content: Thought content normal.    Results for orders placed or performed in visit on 12/20/20  CBC with Differential/Platelet  Result Value Ref Range   WBC 9.1 3.4 - 10.8 x10E3/uL   RBC 5.54 4.14 - 5.80 x10E6/uL   Hemoglobin 16.6 13.0 - 17.7 g/dL   Hematocrit 49.8 37.5 - 51.0 %   MCV 90 79 - 97 fL   MCH 30.0 26.6 - 33.0 pg   MCHC 33.3 31.5 - 35.7 g/dL   RDW 12.6 11.6 - 15.4 %   Platelets 236 150 - 450 x10E3/uL    Neutrophils 66 Not Estab. %   Lymphs 19 Not Estab. %   Monocytes 7 Not Estab. %   Eos 7 Not Estab. %   Basos 1 Not Estab. %   Neutrophils Absolute 6.0 1.4 - 7.0 x10E3/uL   Lymphocytes Absolute 1.8 0.7 - 3.1  x10E3/uL   Monocytes Absolute 0.6 0.1 - 0.9 x10E3/uL   EOS (ABSOLUTE) 0.7 (H) 0.0 - 0.4 x10E3/uL   Basophils Absolute 0.1 0.0 - 0.2 x10E3/uL   Immature Granulocytes 0 Not Estab. %   Immature Grans (Abs) 0.0 0.0 - 0.1 x10E3/uL  Comprehensive metabolic panel  Result Value Ref Range   Glucose 76 70 - 99 mg/dL   BUN 18 6 - 24 mg/dL   Creatinine, Ser 1.03 0.76 - 1.27 mg/dL   eGFR 92 >59 mL/min/1.73   BUN/Creatinine Ratio 17 9 - 20   Sodium 140 134 - 144 mmol/L   Potassium 4.3 3.5 - 5.2 mmol/L   Chloride 102 96 - 106 mmol/L   CO2 23 20 - 29 mmol/L   Calcium 9.8 8.7 - 10.2 mg/dL   Total Protein 7.5 6.0 - 8.5 g/dL   Albumin 4.6 4.0 - 5.0 g/dL   Globulin, Total 2.9 1.5 - 4.5 g/dL   Albumin/Globulin Ratio 1.6 1.2 - 2.2   Bilirubin Total 0.6 0.0 - 1.2 mg/dL   Alkaline Phosphatase 79 44 - 121 IU/L   AST 17 0 - 40 IU/L   ALT 23 0 - 44 IU/L  Sed Rate (ESR)  Result Value Ref Range   Sed Rate 16 (H) 0 - 15 mm/hr  C-reactive protein  Result Value Ref Range   CRP 3 0 - 10 mg/L      Assessment & Plan:   Problem List Items Addressed This Visit       Other   Dizziness - Primary    Acute and improved.  No further symptoms.  Recommend complete Doxycycline regimen.  Is scheduled to see ENT upcoming, will review note after visit.      Other Visit Diagnoses     Flu vaccine need       Flu vaccine provided today.   Relevant Orders   Flu Vaccine QUAD 6+ mos PF IM (Fluarix Quad PF) (Completed)        Follow up plan: Return in about 6 months (around 07/05/2021) for Annual physical.

## 2021-01-01 NOTE — Patient Instructions (Signed)

## 2021-01-17 DIAGNOSIS — J324 Chronic pansinusitis: Secondary | ICD-10-CM | POA: Insufficient documentation

## 2021-01-17 DIAGNOSIS — J310 Chronic rhinitis: Secondary | ICD-10-CM | POA: Diagnosis not present

## 2021-01-17 DIAGNOSIS — Z6832 Body mass index (BMI) 32.0-32.9, adult: Secondary | ICD-10-CM | POA: Diagnosis not present

## 2021-01-17 DIAGNOSIS — J3489 Other specified disorders of nose and nasal sinuses: Secondary | ICD-10-CM | POA: Diagnosis not present

## 2021-01-25 NOTE — Addendum Note (Signed)
Addended by: Aura Dials T on: 01/25/2021 08:03 AM   Modules accepted: Level of Service

## 2021-01-26 DIAGNOSIS — M810 Age-related osteoporosis without current pathological fracture: Secondary | ICD-10-CM | POA: Diagnosis not present

## 2021-01-26 DIAGNOSIS — J329 Chronic sinusitis, unspecified: Secondary | ICD-10-CM | POA: Diagnosis not present

## 2021-01-26 DIAGNOSIS — J324 Chronic pansinusitis: Secondary | ICD-10-CM | POA: Diagnosis not present

## 2021-01-26 DIAGNOSIS — J3489 Other specified disorders of nose and nasal sinuses: Secondary | ICD-10-CM | POA: Diagnosis not present

## 2021-01-26 DIAGNOSIS — G4733 Obstructive sleep apnea (adult) (pediatric): Secondary | ICD-10-CM | POA: Diagnosis not present

## 2021-01-26 DIAGNOSIS — J342 Deviated nasal septum: Secondary | ICD-10-CM | POA: Diagnosis not present

## 2021-01-26 DIAGNOSIS — J310 Chronic rhinitis: Secondary | ICD-10-CM | POA: Diagnosis not present

## 2021-01-30 DIAGNOSIS — J324 Chronic pansinusitis: Secondary | ICD-10-CM | POA: Diagnosis not present

## 2021-02-14 DIAGNOSIS — J324 Chronic pansinusitis: Secondary | ICD-10-CM | POA: Diagnosis not present

## 2021-03-01 ENCOUNTER — Encounter: Payer: Self-pay | Admitting: Nurse Practitioner

## 2021-03-01 ENCOUNTER — Other Ambulatory Visit: Payer: Self-pay

## 2021-03-01 MED ORDER — FLUTICASONE PROPIONATE 50 MCG/ACT NA SUSP: 2.0000 | Freq: Every day | NASAL | 6 refills | Status: DC

## 2021-03-01 MED ORDER — OXYMETAZOLINE HCL 0.05 % NA SOLN: 1.0000 | Freq: Two times a day (BID) | NASAL | 4 refills | Status: DC | PRN

## 2021-03-13 DIAGNOSIS — J324 Chronic pansinusitis: Secondary | ICD-10-CM | POA: Diagnosis not present

## 2021-04-25 ENCOUNTER — Encounter: Payer: Self-pay | Admitting: Nurse Practitioner

## 2021-05-13 NOTE — Patient Instructions (Signed)
Rash, Adult  A rash is a change in the color of your skin. A rash can also change the way your skin feels. There are many different conditions and factors that can causea rash. Follow these instructions at home: The goal of treatment is to stop the itching and keep the rash from spreading. Watch for any changes in your symptoms. Let your doctor know about them. Followthese instructions to help with your condition: Medicine Take or apply over-the-counter and prescription medicines only as told by your doctor. These may include medicines: To treat red or swollen skin (corticosteroid creams). To treat itching. To treat an allergy (oral antihistamines). To treat very bad symptoms (oral corticosteroids).  Skin care Put cool cloths (compresses) on the affected areas. Do not scratch or rub your skin. Avoid covering the rash. Make sure that the rash is exposed to air as much as possible. Managing itching and discomfort Avoid hot showers or baths. These can make itching worse. A cold shower may help. Try taking a bath with: Epsom salts. You can get these at your local pharmacy or grocery store. Follow the instructions on the package. Baking soda. Pour a small amount into the bath as told by your doctor. Colloidal oatmeal. You can get this at your local pharmacy or grocery store. Follow the instructions on the package. Try putting baking soda paste onto your skin. Stir water into baking soda until it gets like a paste. Try putting on a lotion that relieves itchiness (calamine lotion). Keep cool and out of the sun. Sweating and being hot can make itching worse. General instructions  Rest as needed. Drink enough fluid to keep your pee (urine) pale yellow. Wear loose-fitting clothing. Avoid scented soaps, detergents, and perfumes. Use gentle soaps, detergents, perfumes, and other cosmetic products. Avoid anything that causes your rash. Keep a journal to help track what causes your rash. Write  down: What you eat. What cosmetic products you use. What you drink. What you wear. This includes jewelry. Keep all follow-up visits as told by your doctor. This is important.  Contact a doctor if: You sweat at night. You lose weight. You pee (urinate) more than normal. You pee less than normal, or you notice that your pee is a darker color than normal. You feel weak. You throw up (vomit). Your skin or the whites of your eyes look yellow (jaundice). Your skin: Tingles. Is numb. Your rash: Does not go away after a few days. Gets worse. You are: More thirsty than normal. More tired than normal. You have: New symptoms. Pain in your belly (abdomen). A fever. Watery poop (diarrhea). Get help right away if: You have a fever and your symptoms suddenly get worse. You start to feel mixed up (confused). You have a very bad headache or a stiff neck. You have very bad joint pains or stiffness. You have jerky movements that you cannot control (seizure). Your rash covers all or most of your body. The rash may or may not be painful. You have blisters that: Are on top of the rash. Grow larger. Grow together. Are painful. Are inside your nose or mouth. You have a rash that: Looks like purple pinprick-sized spots all over your body. Has a "bull's eye" or looks like a target. Is red and painful, causes your skin to peel, and is not from being in the sun too long. Summary A rash is a change in the color of your skin. A rash can also change the way your skin feels.   The goal of treatment is to stop the itching and keep the rash from spreading. Take or apply over-the-counter and prescription medicines only as told by your doctor. Contact a doctor if you have new symptoms or symptoms that get worse. Keep all follow-up visits as told by your doctor. This is important. This information is not intended to replace advice given to you by your health care provider. Make sure you discuss any  questions you have with your healthcare provider. Document Revised: 06/19/2018 Document Reviewed: 09/29/2017 Elsevier Patient Education  2022 [employer]  

## 2021-05-16 ENCOUNTER — Encounter: Payer: Self-pay | Admitting: Nurse Practitioner

## 2021-05-16 ENCOUNTER — Ambulatory Visit: Payer: BC Managed Care – PPO | Admitting: Nurse Practitioner

## 2021-05-16 ENCOUNTER — Other Ambulatory Visit: Payer: Self-pay

## 2021-05-16 VITALS — BP 119/80 | HR 91 | Temp 98.2°F | Ht 70.0 in | Wt 225.8 lb

## 2021-05-16 DIAGNOSIS — L918 Other hypertrophic disorders of the skin: Secondary | ICD-10-CM | POA: Diagnosis not present

## 2021-05-16 DIAGNOSIS — R21 Rash and other nonspecific skin eruption: Secondary | ICD-10-CM | POA: Diagnosis not present

## 2021-05-16 DIAGNOSIS — J324 Chronic pansinusitis: Secondary | ICD-10-CM | POA: Diagnosis not present

## 2021-05-16 MED ORDER — DESONIDE 0.05 % EX CREA: TOPICAL_CREAM | Freq: Two times a day (BID) | CUTANEOUS | 0 refills | Status: DC | PRN

## 2021-05-16 NOTE — Assessment & Plan Note (Addendum)
Noted under left eye. Patient states it has triple in size and wanted it removed today. Due to location of skin tag will referral to dermatology for removal.  ?

## 2021-05-16 NOTE — Assessment & Plan Note (Signed)
Patient first noted redness/ rash under for eyes in January. Has attempted multiple otc treatments without improvement. Desonide 0.05% cream BID ordered. Patient instructed to carefully apply a small amount to area and notify provider if worsens.  ?

## 2021-05-16 NOTE — Progress Notes (Signed)
? ?BP 119/80   Pulse 91   Temp 98.2 ?F (36.8 ?C) (Oral)   Ht 5' 10"  (1.778 m)   Wt 102.4 kg   SpO2 98%   BMI 32.40 kg/m?   ? ?Subjective:  ? ? Patient ID: Jon Saunders, male    DOB: 07-04-1975, 46 y.o.   MRN: [id number] ? ?NOTE WRITTEN BY [employer] DNP STUDENT.  ASSESSMENT AND PLAN OF CARE REVIEWED WITH STUDENT, AGREE WITH ABOVE FINDINGS AND PLAN.  ? ?HPI: ?Jon Saunders is a 46 y.o. male with complaint of redness under the eyes and a skin tag which has triple in size ? ?RASH ?Duration:  months First started on Jan 2023 ?Location: Under both eyes ?Itching: yes yes a little ?Burning: nono ?Redness: non ?Oozing: non ?Scaling: yes dry and flaky under both eyes ?Blisters: non ?Painful: non ?Fevers: non ?Change in detergents/soaps/personal care products: no ?Recent illness: no ?Recent travel:no ?History of same: no ?Context:  ?Alleviating factors: nothing sometimes washing the area with water helps a little bit ?Treatments attempted:yesbenadryl, OTC anit-fungal, and lotion/moisturizer ?Shortness of breath:  no ?Throat/tongue swelling: nono ?Myalgias/arthralgias:  no ? ?Relevant past medical, surgical, family and social history reviewed and updated as indicated. Interim medical history since our last visit reviewed. ?Allergies and medications reviewed and updated. ? ?Review of Systems  ?Constitutional:  Negative for appetite change, chills, diaphoresis, fatigue, fever and unexpected weight change.  ?HENT:  Negative for congestion, ear discharge, ear pain, facial swelling, postnasal drip, rhinorrhea, sinus pressure, sinus pain, sneezing and sore throat.   ?Eyes:  Negative for photophobia, pain, discharge, redness, itching and visual disturbance.  ?Respiratory:  Negative for cough and shortness of breath.   ?Cardiovascular:  Negative for chest pain.  ?Endocrine: Negative.   ?Genitourinary: Negative.   ?Skin:  Positive for rash.  ?Allergic/Immunologic: Negative for environmental allergies, food allergies and immunocompromised  state.  ?Neurological:  Negative for dizziness, facial asymmetry, light-headedness and headaches.  ? ?Per HPI unless specifically indicated above ? ?   ?Objective:  ?  ?BP 119/80   Pulse 91   Temp 98.2 ?F (36.8 ?C) (Oral)   Ht 5' 10"  (1.778 m)   Wt 102.4 kg   SpO2 98%   BMI 32.40 kg/m?   ?Wt Readings from Last 3 Encounters:  ?05/16/21 102.4 kg  ?01/01/21 101.2 kg  ?12/20/20 102.1 kg  ?  ?Physical Exam ?Constitutional:   ?   General: He is not in acute distress. ?   Appearance: Normal appearance. He is well-groomed. He is not ill-appearing.  ?HENT:  ?   Head: Normocephalic. No right periorbital erythema or left periorbital erythema.  ?   Jaw: No tenderness, swelling or pain on movement.  ?   Salivary Glands: Right salivary gland is not diffusely enlarged or tender. Left salivary gland is not diffusely enlarged or tender.  ?   Right Ear: Tympanic membrane, ear canal and external ear normal.  ?   Left Ear: Tympanic membrane, ear canal and external ear normal.  ?   Nose: Nose normal. No congestion or rhinorrhea.  ?   Mouth/Throat:  ?   Mouth: Mucous membranes are moist.  ?   Pharynx: Oropharynx is clear.  ?Eyes:  ?   General: Lids are normal. No scleral icterus.    ?   Right eye: No foreign body or discharge.     ?   Left eye: No foreign body or discharge.  ?   Extraocular Movements: Extraocular movements intact.  ?  Conjunctiva/sclera: Conjunctivae normal.  ?   Right eye: Right conjunctiva is not injected. No exudate. ?   Left eye: Left conjunctiva is not injected. No exudate. ?Neck:  ?   Thyroid: No thyromegaly or thyroid tenderness.  ?   Vascular: No carotid bruit.  ?   Trachea: Trachea normal.  ?Cardiovascular:  ?   Rate and Rhythm: Normal rate and regular rhythm.  ?   Pulses: Normal pulses.  ?   Heart sounds: Normal heart sounds. No murmur heard. ?  No gallop.  ?Pulmonary:  ?   Effort: Pulmonary effort is normal. No accessory muscle usage or respiratory distress.  ?   Breath sounds: Normal breath sounds. No  wheezing or rhonchi.  ?Abdominal:  ?   General: Bowel sounds are normal.  ?Musculoskeletal:     ?   General: No swelling.  ?   Cervical back: Normal range of motion. No tenderness.  ?Lymphadenopathy:  ?   Head:  ?   Right side of head: No submental, submandibular, tonsillar, preauricular, posterior auricular or occipital adenopathy.  ?   Left side of head: No submental, submandibular, tonsillar, preauricular, posterior auricular or occipital adenopathy.  ?   Cervical: No cervical adenopathy.  ?   Right cervical: No superficial, deep or posterior cervical adenopathy. ?   Left cervical: No superficial, deep or posterior cervical adenopathy.  ?   Upper Body:  ?   Right upper body: No supraclavicular adenopathy.  ?   Left upper body: No supraclavicular adenopathy.  ?Skin: ?   General: Skin is warm.  ?   Findings: Rash present. Rash is scaling.  ?   Comments: Some redness and areas of dryness noted under both eyes  ?Neurological:  ?   Mental Status: He is alert and oriented to person, place, and time.  ?Psychiatric:     ?   Attention and Perception: Attention normal.     ?   Mood and Affect: Mood and affect normal.     ?   Speech: Speech normal.     ?   Behavior: Behavior normal. Behavior is cooperative.     ?   Thought Content: Thought content normal.     ?   Judgment: Judgment normal.  ? ? ?Results for orders placed or performed in visit on 12/20/20  ?CBC with Differential/Platelet  ?Result Value Ref Range  ? WBC 9.1 3.4 - 10.8 x10E3/uL  ? RBC 5.54 4.14 - 5.80 x10E6/uL  ? Hemoglobin 16.6 13.0 - 17.7 g/dL  ? Hematocrit 49.8 37.5 - 51.0 %  ? MCV 90 79 - 97 fL  ? MCH 30.0 26.6 - 33.0 pg  ? MCHC 33.3 31.5 - 35.7 g/dL  ? RDW 12.6 11.6 - 15.4 %  ? Platelets 236 150 - 450 x10E3/uL  ? Neutrophils 66 Not Estab. %  ? Lymphs 19 Not Estab. %  ? Monocytes 7 Not Estab. %  ? Eos 7 Not Estab. %  ? Basos 1 Not Estab. %  ? Neutrophils Absolute 6.0 1.4 - 7.0 x10E3/uL  ? Lymphocytes Absolute 1.8 0.7 - 3.1 x10E3/uL  ? Monocytes Absolute  0.6 0.1 - 0.9 x10E3/uL  ? EOS (ABSOLUTE) 0.7 (H) 0.0 - 0.4 x10E3/uL  ? Basophils Absolute 0.1 0.0 - 0.2 x10E3/uL  ? Immature Granulocytes 0 Not Estab. %  ? Immature Grans (Abs) 0.0 0.0 - 0.1 x10E3/uL  ?Comprehensive metabolic panel  ?Result Value Ref Range  ? Glucose 76 70 - 99 mg/dL  ? BUN 18  6 - 24 mg/dL  ? Creatinine, Ser 1.03 0.76 - 1.27 mg/dL  ? eGFR 92 >59 mL/min/1.73  ? BUN/Creatinine Ratio 17 9 - 20  ? Sodium 140 134 - 144 mmol/L  ? Potassium 4.3 3.5 - 5.2 mmol/L  ? Chloride 102 96 - 106 mmol/L  ? CO2 23 20 - 29 mmol/L  ? Calcium 9.8 8.7 - 10.2 mg/dL  ? Total Protein 7.5 6.0 - 8.5 g/dL  ? Albumin 4.6 4.0 - 5.0 g/dL  ? Globulin, Total 2.9 1.5 - 4.5 g/dL  ? Albumin/Globulin Ratio 1.6 1.2 - 2.2  ? Bilirubin Total 0.6 0.0 - 1.2 mg/dL  ? Alkaline Phosphatase 79 44 - 121 IU/L  ? AST 17 0 - 40 IU/L  ? ALT 23 0 - 44 IU/L  ?Sed Rate (ESR)  ?Result Value Ref Range  ? Sed Rate 16 (H) 0 - 15 mm/hr  ?C-reactive protein  ?Result Value Ref Range  ? CRP 3 0 - 10 mg/L  ? ?   ?Assessment & Plan:  ? ?Problem List Items Addressed This Visit   ? ?  ? Musculoskeletal and Integument  ? Rash  ?  Patient first noted redness/ rash under for eyes in January. Has attempted multiple otc treatments without improvement. Desonide 0.05% cream BID ordered. Patient instructed to carefully apply a small amount to area and notify provider if worsens.  ?  ?  ? Skin tag - Primary  ?  Noted under left eye. Patient states it has triple in size and wanted it removed today. Due to location of skin tag will referral to dermatology for removal.  ?  ?  ? Relevant Orders  ? Ambulatory referral to Dermatology  ?  ? ?Follow up plan: ?Return if symptoms worsen or fail to improve. ? ? ? ? ? ?

## 2021-05-25 ENCOUNTER — Encounter: Payer: Self-pay | Admitting: Nurse Practitioner

## 2021-07-01 NOTE — Patient Instructions (Incomplete)
Healthy Eating ?Following a healthy eating pattern may help you to achieve and maintain a healthy body weight, reduce the risk of chronic disease, and live a long and productive life. It is important to follow a healthy eating pattern at an appropriate calorie level for your body. Your nutritional needs should be met primarily through food by choosing a variety of nutrient-rich foods. ?What are tips for following this plan? ?Reading food labels ?Read labels and choose the following: ?Reduced or low sodium. ?Juices with 100% fruit juice. ?Foods with low saturated fats and high polyunsaturated and monounsaturated fats. ?Foods with whole grains, such as whole wheat, cracked wheat, brown rice, and wild rice. ?Whole grains that are fortified with folic acid. This is recommended for women who are pregnant or who want to become pregnant. ?Read labels and avoid the following: ?Foods with a lot of added sugars. These include foods that contain brown sugar, corn sweetener, corn syrup, dextrose, fructose, glucose, high-fructose corn syrup, honey, invert sugar, lactose, malt syrup, maltose, molasses, raw sugar, sucrose, trehalose, or turbinado sugar. ?Do not eat more than the following amounts of added sugar per day: ?6 teaspoons (25 g) for women. ?9 teaspoons (38 g) for men. ?Foods that contain processed or refined starches and grains. ?Refined grain products, such as white flour, degermed cornmeal, white bread, and white rice. ?Shopping ?Choose nutrient-rich snacks, such as vegetables, whole fruits, and nuts. Avoid high-calorie and high-sugar snacks, such as potato chips, fruit snacks, and candy. ?Use oil-based dressings and spreads on foods instead of solid fats such as butter, stick margarine, or cream cheese. ?Limit pre-made sauces, mixes, and "instant" products such as flavored rice, instant noodles, and ready-made pasta. ?Try more plant-protein sources, such as tofu, tempeh, black beans, edamame, lentils, nuts, and  seeds. ?Explore eating plans such as the Mediterranean diet or vegetarian diet. ?Cooking ?Use oil to saut? or stir-fry foods instead of solid fats such as butter, stick margarine, or lard. ?Try baking, boiling, grilling, or broiling instead of frying. ?Remove the fatty part of meats before cooking. ?Steam vegetables in water or broth. ?Meal planning ? ?At meals, imagine dividing your plate into fourths: ?One-half of your plate is fruits and vegetables. ?One-fourth of your plate is whole grains. ?One-fourth of your plate is protein, especially lean meats, poultry, eggs, tofu, beans, or nuts. ?Include low-fat dairy as part of your daily diet. ?Lifestyle ?Choose healthy options in all settings, including home, work, school, restaurants, or stores. ?Prepare your food safely: ?Wash your hands after handling raw meats. ?Keep food preparation surfaces clean by regularly washing with hot, soapy water. ?Keep raw meats separate from ready-to-eat foods, such as fruits and vegetables. ?Cook seafood, meat, poultry, and eggs to the recommended internal temperature. ?Store foods at safe temperatures. In general: ?Keep cold foods at 40?F (4.4?C) or below. ?Keep hot foods at 140?F (60?C) or above. ?Keep your freezer at 0?F (-17.8?C) or below. ?Foods are no longer safe to eat when they have been between the temperatures of 40?-140?F (4.4-60?C) for more than 2 hours. ?What foods should I eat? ?Fruits ?Aim to eat 2 cup-equivalents of fresh, canned (in natural juice), or frozen fruits each day. Examples of 1 cup-equivalent of fruit include 1 small apple, 8 large strawberries, 1 cup canned fruit, ? cup dried fruit, or 1 cup 100% juice. ?Vegetables ?Aim to eat 2?-3 cup-equivalents of fresh and frozen vegetables each day, including different varieties and colors. Examples of 1 cup-equivalent of vegetables include 2 medium carrots, 2 cups raw,  leafy greens, 1 cup chopped vegetable (raw or cooked), or 1 medium baked potato. ?Grains ?Aim to  eat 6 ounce-equivalents of whole grains each day. Examples of 1 ounce-equivalent of grains include 1 slice of bread, 1 cup ready-to-eat cereal, 3 cups popcorn, or ? cup cooked rice, pasta, or cereal. ?Meats and other proteins ?Aim to eat 5-6 ounce-equivalents of protein each day. Examples of 1 ounce-equivalent of protein include 1 egg, 1/2 cup nuts or seeds, or 1 tablespoon (16 g) peanut butter. A cut of meat or fish that is the size of a deck of cards is about 3-4 ounce-equivalents. ?Of the protein you eat each week, try to have at least 8 ounces come from seafood. This includes salmon, trout, herring, and anchovies. ?Dairy ?Aim to eat 3 cup-equivalents of fat-free or low-fat dairy each day. Examples of 1 cup-equivalent of dairy include 1 cup (240 mL) milk, 8 ounces (250 g) yogurt, 1? ounces (44 g) natural cheese, or 1 cup (240 mL) fortified soy milk. ?Fats and oils ?Aim for about 5 teaspoons (21 g) per day. Choose monounsaturated fats, such as canola and olive oils, avocados, peanut butter, and most nuts, or polyunsaturated fats, such as sunflower, corn, and soybean oils, walnuts, pine nuts, sesame seeds, sunflower seeds, and flaxseed. ?Beverages ?Aim for six 8-oz glasses of water per day. Limit coffee to three to five 8-oz cups per day. ?Limit caffeinated beverages that have added calories, such as soda and energy drinks. ?Limit alcohol intake to no more than 1 drink a day for nonpregnant women and 2 drinks a day for men. One drink equals 12 oz of beer (355 mL), 5 oz of wine (148 mL), or 1? oz of hard liquor (44 mL). ?Seasoning and other foods ?Avoid adding excess amounts of salt to your foods. Try flavoring foods with herbs and spices instead of salt. ?Avoid adding sugar to foods. ?Try using oil-based dressings, sauces, and spreads instead of solid fats. ?This information is based on general [place] nutrition guidelines. For more information, visit choosemyplate.gov. Exact amounts may vary based on your nutrition  needs. ?Summary ?A healthy eating plan may help you to maintain a healthy weight, reduce the risk of chronic diseases, and stay active throughout your life. ?Plan your meals. Make sure you eat the right portions of a variety of nutrient-rich foods. ?Try baking, boiling, grilling, or broiling instead of frying. ?Choose healthy options in all settings, including home, work, school, restaurants, or stores. ?This information is not intended to replace advice given to you by your health care provider. Make sure you discuss any questions you have with your health care provider. ?Document Revised: 10/24/2020 Document Reviewed: 10/24/2020 ?Elsevier Patient Education ? 2023 [employer] ? ?

## 2021-07-05 ENCOUNTER — Encounter: Payer: BC Managed Care – PPO | Admitting: Nurse Practitioner

## 2021-07-05 DIAGNOSIS — Z13220 Encounter for screening for lipoid disorders: Secondary | ICD-10-CM

## 2021-07-05 DIAGNOSIS — F418 Other specified anxiety disorders: Secondary | ICD-10-CM

## 2021-07-05 DIAGNOSIS — Z0000 Encounter for general adult medical examination without abnormal findings: Secondary | ICD-10-CM

## 2021-07-05 DIAGNOSIS — Z1159 Encounter for screening for other viral diseases: Secondary | ICD-10-CM

## 2021-07-05 DIAGNOSIS — E6609 Other obesity due to excess calories: Secondary | ICD-10-CM

## 2021-07-18 DIAGNOSIS — R051 Acute cough: Secondary | ICD-10-CM | POA: Diagnosis not present

## 2021-07-18 DIAGNOSIS — J209 Acute bronchitis, unspecified: Secondary | ICD-10-CM | POA: Diagnosis not present

## 2021-07-22 NOTE — Patient Instructions (Signed)
Healthy Eating ?Following a healthy eating pattern may help you to achieve and maintain a healthy body weight, reduce the risk of chronic disease, and live a long and productive life. It is important to follow a healthy eating pattern at an appropriate calorie level for your body. Your nutritional needs should be met primarily through food by choosing a variety of nutrient-rich foods. ?What are tips for following this plan? ?Reading food labels ?Read labels and choose the following: ?Reduced or low sodium. ?Juices with 100% fruit juice. ?Foods with low saturated fats and high polyunsaturated and monounsaturated fats. ?Foods with whole grains, such as whole wheat, cracked wheat, brown rice, and wild rice. ?Whole grains that are fortified with folic acid. This is recommended for women who are pregnant or who want to become pregnant. ?Read labels and avoid the following: ?Foods with a lot of added sugars. These include foods that contain brown sugar, corn sweetener, corn syrup, dextrose, fructose, glucose, high-fructose corn syrup, honey, invert sugar, lactose, malt syrup, maltose, molasses, raw sugar, sucrose, trehalose, or turbinado sugar. ?Do not eat more than the following amounts of added sugar per day: ?6 teaspoons (25 g) for women. ?9 teaspoons (38 g) for men. ?Foods that contain processed or refined starches and grains. ?Refined grain products, such as white flour, degermed cornmeal, white bread, and white rice. ?Shopping ?Choose nutrient-rich snacks, such as vegetables, whole fruits, and nuts. Avoid high-calorie and high-sugar snacks, such as potato chips, fruit snacks, and candy. ?Use oil-based dressings and spreads on foods instead of solid fats such as butter, stick margarine, or cream cheese. ?Limit pre-made sauces, mixes, and "instant" products such as flavored rice, instant noodles, and ready-made pasta. ?Try more plant-protein sources, such as tofu, tempeh, black beans, edamame, lentils, nuts, and  seeds. ?Explore eating plans such as the Mediterranean diet or vegetarian diet. ?Cooking ?Use oil to saut? or stir-fry foods instead of solid fats such as butter, stick margarine, or lard. ?Try baking, boiling, grilling, or broiling instead of frying. ?Remove the fatty part of meats before cooking. ?Steam vegetables in water or broth. ?Meal planning ? ?At meals, imagine dividing your plate into fourths: ?One-half of your plate is fruits and vegetables. ?One-fourth of your plate is whole grains. ?One-fourth of your plate is protein, especially lean meats, poultry, eggs, tofu, beans, or nuts. ?Include low-fat dairy as part of your daily diet. ?Lifestyle ?Choose healthy options in all settings, including home, work, school, restaurants, or stores. ?Prepare your food safely: ?Wash your hands after handling raw meats. ?Keep food preparation surfaces clean by regularly washing with hot, soapy water. ?Keep raw meats separate from ready-to-eat foods, such as fruits and vegetables. ?Cook seafood, meat, poultry, and eggs to the recommended internal temperature. ?Store foods at safe temperatures. In general: ?Keep cold foods at 40?F (4.4?C) or below. ?Keep hot foods at 140?F (60?C) or above. ?Keep your freezer at 0?F (-17.8?C) or below. ?Foods are no longer safe to eat when they have been between the temperatures of 40?-140?F (4.4-60?C) for more than 2 hours. ?What foods should I eat? ?Fruits ?Aim to eat 2 cup-equivalents of fresh, canned (in natural juice), or frozen fruits each day. Examples of 1 cup-equivalent of fruit include 1 small apple, 8 large strawberries, 1 cup canned fruit, ? cup dried fruit, or 1 cup 100% juice. ?Vegetables ?Aim to eat 2?-3 cup-equivalents of fresh and frozen vegetables each day, including different varieties and colors. Examples of 1 cup-equivalent of vegetables include 2 medium carrots, 2 cups raw,  leafy greens, 1 cup chopped vegetable (raw or cooked), or 1 medium baked potato. ?Grains ?Aim to  eat 6 ounce-equivalents of whole grains each day. Examples of 1 ounce-equivalent of grains include 1 slice of bread, 1 cup ready-to-eat cereal, 3 cups popcorn, or ? cup cooked rice, pasta, or cereal. ?Meats and other proteins ?Aim to eat 5-6 ounce-equivalents of protein each day. Examples of 1 ounce-equivalent of protein include 1 egg, 1/2 cup nuts or seeds, or 1 tablespoon (16 g) peanut butter. A cut of meat or fish that is the size of a deck of cards is about 3-4 ounce-equivalents. ?Of the protein you eat each week, try to have at least 8 ounces come from seafood. This includes salmon, trout, herring, and anchovies. ?Dairy ?Aim to eat 3 cup-equivalents of fat-free or low-fat dairy each day. Examples of 1 cup-equivalent of dairy include 1 cup (240 mL) milk, 8 ounces (250 g) yogurt, 1? ounces (44 g) natural cheese, or 1 cup (240 mL) fortified soy milk. ?Fats and oils ?Aim for about 5 teaspoons (21 g) per day. Choose monounsaturated fats, such as canola and olive oils, avocados, peanut butter, and most nuts, or polyunsaturated fats, such as sunflower, corn, and soybean oils, walnuts, pine nuts, sesame seeds, sunflower seeds, and flaxseed. ?Beverages ?Aim for six 8-oz glasses of water per day. Limit coffee to three to five 8-oz cups per day. ?Limit caffeinated beverages that have added calories, such as soda and energy drinks. ?Limit alcohol intake to no more than 1 drink a day for nonpregnant women and 2 drinks a day for men. One drink equals 12 oz of beer (355 mL), 5 oz of wine (148 mL), or 1? oz of hard liquor (44 mL). ?Seasoning and other foods ?Avoid adding excess amounts of salt to your foods. Try flavoring foods with herbs and spices instead of salt. ?Avoid adding sugar to foods. ?Try using oil-based dressings, sauces, and spreads instead of solid fats. ?This information is based on general [place] nutrition guidelines. For more information, visit choosemyplate.gov. Exact amounts may vary based on your nutrition  needs. ?Summary ?A healthy eating plan may help you to maintain a healthy weight, reduce the risk of chronic diseases, and stay active throughout your life. ?Plan your meals. Make sure you eat the right portions of a variety of nutrient-rich foods. ?Try baking, boiling, grilling, or broiling instead of frying. ?Choose healthy options in all settings, including home, work, school, restaurants, or stores. ?This information is not intended to replace advice given to you by your health care provider. Make sure you discuss any questions you have with your health care provider. ?Document Revised: 10/24/2020 Document Reviewed: 10/24/2020 ?Elsevier Patient Education ? 2023 [employer] Inc. ? ?

## 2021-07-25 ENCOUNTER — Ambulatory Visit (INDEPENDENT_AMBULATORY_CARE_PROVIDER_SITE_OTHER): Payer: BC Managed Care – PPO | Admitting: Nurse Practitioner

## 2021-07-25 ENCOUNTER — Ambulatory Visit
Admission: RE | Admit: 2021-07-25 | Discharge: 2021-07-25 | Disposition: A | Discharge status: Home / Self Care | Payer: BC Managed Care – PPO | Attending: Nurse Practitioner | Admitting: Nurse Practitioner

## 2021-07-25 ENCOUNTER — Ambulatory Visit
Admission: RE | Admit: 2021-07-25 | Discharge: 2021-07-25 | Disposition: A | Discharge status: Home / Self Care | Payer: BC Managed Care – PPO | Source: Ambulatory Visit | Attending: Nurse Practitioner | Admitting: Nurse Practitioner

## 2021-07-25 ENCOUNTER — Encounter: Payer: Self-pay | Admitting: Nurse Practitioner

## 2021-07-25 VITALS — BP 100/69 | HR 63 | Temp 98.1°F | Ht 71.0 in | Wt 222.6 lb

## 2021-07-25 DIAGNOSIS — R059 Cough, unspecified: Secondary | ICD-10-CM | POA: Diagnosis not present

## 2021-07-25 DIAGNOSIS — Z1159 Encounter for screening for other viral diseases: Secondary | ICD-10-CM | POA: Diagnosis not present

## 2021-07-25 DIAGNOSIS — Z136 Encounter for screening for cardiovascular disorders: Secondary | ICD-10-CM

## 2021-07-25 DIAGNOSIS — F418 Other specified anxiety disorders: Secondary | ICD-10-CM | POA: Diagnosis not present

## 2021-07-25 DIAGNOSIS — E6609 Other obesity due to excess calories: Secondary | ICD-10-CM | POA: Diagnosis not present

## 2021-07-25 DIAGNOSIS — J324 Chronic pansinusitis: Secondary | ICD-10-CM | POA: Diagnosis not present

## 2021-07-25 DIAGNOSIS — Z13220 Encounter for screening for lipoid disorders: Secondary | ICD-10-CM

## 2021-07-25 DIAGNOSIS — R051 Acute cough: Secondary | ICD-10-CM

## 2021-07-25 DIAGNOSIS — Z6831 Body mass index (BMI) 31.0-31.9, adult: Secondary | ICD-10-CM

## 2021-07-25 DIAGNOSIS — Z0000 Encounter for general adult medical examination without abnormal findings: Secondary | ICD-10-CM

## 2021-07-25 MED ORDER — FLUOXETINE HCL 40 MG PO CAPS: ORAL_CAPSULE | ORAL | 4 refills | Status: DC

## 2021-07-25 MED ORDER — FLUTICASONE PROPIONATE 50 MCG/ACT NA SUSP
2.0000 | Freq: Every day | NASAL | 6 refills | Status: DC
Start: 2021-07-25 — End: 2022-03-21

## 2021-07-25 MED ORDER — PREDNISONE 20 MG PO TABS: 40.0000 mg | ORAL_TABLET | Freq: Every day | ORAL | 0 refills | Status: AC

## 2021-07-25 MED ORDER — HYDROCOD POLI-CHLORPHE POLI ER 10-8 MG/5ML PO SUER: 5.0000 mL | Freq: Two times a day (BID) | ORAL | 0 refills | Status: DC | PRN

## 2021-07-25 MED ORDER — ALBUTEROL SULFATE HFA 108 (90 BASE) MCG/ACT IN AERS: 2.0000 | INHALATION_SPRAY | Freq: Four times a day (QID) | RESPIRATORY_TRACT | 4 refills | Status: DC | PRN

## 2021-07-25 NOTE — Assessment & Plan Note (Signed)
Acute and ongoing for 2 weeks.  Will obtain imaging to further assess, has history of PNA in past.  Underlying allergy and sinus issues, continue current OTC medication regimen and ENT collaboration.  Will start Prednisone 40 MG daily for 5 days + Tussionex and Albuterol as needed.  Return to office if worsening or ongoing symptoms. ?

## 2021-07-25 NOTE — Assessment & Plan Note (Signed)
BMI 31.05.  Recommended eating smaller high protein, low fat meals more frequently and exercising 30 mins a day 5 times a week with a goal of 10-15lb weight loss in the next 3 months. Patient voiced their understanding and motivation to adhere to these recommendations. ? ?

## 2021-07-25 NOTE — Assessment & Plan Note (Signed)
Chronic, ongoing.  Denies SI/HI.  Continue current medication regimen and adjust as needed.  Refills sent. 

## 2021-07-25 NOTE — Progress Notes (Signed)
? ?BP 100/69   Pulse 63   Temp 98.1 ?F (36.7 ?C) (Oral)   Ht 5\' 11"  (1.803 m)   Wt 222 lb 9.6 oz (101 kg)   SpO2 96%   BMI 31.05 kg/m?   ? ?Subjective:  ? ? Patient ID: Jon Saunders, male    DOB: 01/14/1976, 46 y.o.   MRN: [id number] ? ?HPI: ?Jon Saunders is a 46 y.o. male presenting on 07/25/2021 for comprehensive medical examination. Current medical complaints include:none ? ?He currently lives with: wife ?Interim Problems from his last visit: no ? ?Continues on Prozac for mood. ? ?COUGH ?Has been present for two weeks, was given Tessalon perles which takes edge off, but at night it is the worst.  He feels this is allergy related, no cold to start out with.  Last saw ENT 05/16/21.  Takes Flonase daily. ?Duration: weeks ?Circumstances of initial development of cough:  allergy related ?Cough severity: severe ?Cough description: hacking and wet ?Aggravating factors:  worse at night ?Alleviating factors: Tessalon, Honey spoonful ?Status:  fluctuating ?Treatments attempted: Tessalon, Honey, Zpack, Albuterol ?Wheezing: no ?Shortness of breath: no ?Chest pain: no ?Chest tightness:yes ?Nasal congestion: no ?Runny nose: no ?Postnasal drip: yes ?Frequent throat clearing or swallowing: yes ?Hemoptysis: no ?Fevers: no ?Night sweats: no ?Weight loss: no ?Heartburn: no ?Recent foreign travel: no ?Tuberculosis contacts: no  ? ?Functional Status Survey: ?Is the patient deaf or have difficulty hearing?: No ?Does the patient have difficulty seeing, even when wearing glasses/contacts?: No ?Does the patient have difficulty concentrating, remembering, or making decisions?: No ?Does the patient have difficulty walking or climbing stairs?: No ?Does the patient have difficulty dressing or bathing?: No ?Does the patient have difficulty doing errands alone such as visiting a doctor's office or shopping?: No ? ?FALL RISK: ? ?  07/25/2021  ?  8:37 AM 07/05/2019  ?  3:00 PM 05/18/2019  ? 10:36 AM 01/05/2015  ? 11:54 AM  ?Fall Risk   ?Falls in the  past year? 0 0 0 No  ?Number falls in past yr: 0 0 0   ?Injury with Fall? 0 0 0   ?Risk for fall due to : No Fall Risks     ?Follow up Falls evaluation completed Falls evaluation completed    ? ? ?Depression Screen ? ?  07/25/2021  ?  8:37 AM 07/05/2019  ?  3:00 PM 05/18/2019  ? 10:37 AM 01/05/2015  ? 11:54 AM  ?Depression screen PHQ 2/9  ?Decreased Interest 0 0 0 0  ?Down, Depressed, Hopeless 0 0 0 0  ?PHQ - 2 Score 0 0 0 0  ?Altered sleeping 0 0    ?Tired, decreased energy 1 1    ?Change in appetite 0 0    ?Feeling bad or failure about yourself  0 0    ?Trouble concentrating 0 0    ?Moving slowly or fidgety/restless 0 0    ?Suicidal thoughts 0 0    ?PHQ-9 Score 1 1    ?Difficult doing work/chores  Not difficult at all    ? ? ?Advanced Directives ?<no information> ? ?Past Medical History:  ?Past Medical History:  ?Diagnosis Date  ? Allergy   ? Anxiety   ? Meningitis   ? [facility]   ? ? ?Surgical History:  ?Past Surgical History:  ?Procedure Laterality Date  ? WISDOM TOOTH EXTRACTION    ? ? ?Medications:  ?Current Outpatient Medications on File Prior to Visit  ?Medication Sig  ? benzonatate (TESSALON)  100 MG capsule Take 100 mg by mouth 3 (three) times daily as needed.  ? desonide (DESOWEN) 0.05 % cream Apply topically 2 (two) times daily as needed. Place just a small amount under eye as needed.  ? ?No current facility-administered medications on file prior to visit.  ? ? ?Allergies:  ?No Known Allergies ? ?Social History:  ?Social History  ? ?Socioeconomic History  ? Marital status: Married  ?  Spouse name: Not on file  ? Number of children: Not on file  ? Years of education: Not on file  ? Highest education level: Not on file  ?Occupational History  ? Occupation: Airline pilot rep  ?Tobacco Use  ? Smoking status: Never  ? Smokeless tobacco: Never  ?Vaping Use  ? Vaping Use: Never used  ?Substance and Sexual Activity  ? Alcohol use: Yes  ?  Alcohol/week: 4.0 standard drinks  ?  Types: 1 Glasses of wine, 3  Standard drinks or equivalent per week  ?  Comment: every couple days   ? Drug use: Never  ? Sexual activity: Yes  ?Other Topics Concern  ? Not on file  ?Social History Narrative  ? Not on file  ? ?Social Determinants of Health  ? ?Financial Resource Strain: Not on file  ?Food Insecurity: Not on file  ?Transportation Needs: Not on file  ?Physical Activity: Not on file  ?Stress: Not on file  ?Social Connections: Not on file  ?Intimate Partner Violence: Not on file  ? ?Social History  ? ?Tobacco Use  ?Smoking Status Never  ?Smokeless Tobacco Never  ? ?Social History  ? ?Substance and Sexual Activity  ?Alcohol Use Yes  ? Alcohol/week: 4.0 standard drinks  ? Types: 1 Glasses of wine, 3 Standard drinks or equivalent per week  ? Comment: every couple days   ? ? ?Family History:  ?Family History  ?Problem Relation Age of Onset  ? Hypertension Mother   ? Epilepsy Mother   ? Seizures Mother   ? Migraines Father   ? Alzheimer's disease Father   ? Epilepsy Sister   ? Seizures Sister   ? Ovarian cancer Maternal Grandmother   ? ? ?Past medical history, surgical history, medications, allergies, family history and social history reviewed with patient today and changes made to appropriate areas of the chart.  ? ?Review of Systems - negative ?All other ROS negative except what is listed above and in the HPI.  ? ?   ?Objective:  ?  ?BP 100/69   Pulse 63   Temp 98.1 ?F (36.7 ?C) (Oral)   Ht 5\' 11"  (1.803 m)   Wt 222 lb 9.6 oz (101 kg)   SpO2 96%   BMI 31.05 kg/m?   ?Wt Readings from Last 3 Encounters:  ?07/25/21 222 lb 9.6 oz (101 kg)  ?05/16/21 225 lb 12.8 oz (102.4 kg)  ?01/01/21 223 lb 3.2 oz (101.2 kg)  ?  ?Physical Exam ?Vitals and nursing note reviewed.  ?Constitutional:   ?   General: He is awake. He is not in acute distress. ?   Appearance: He is well-developed and well-groomed. He is obese. He is not ill-appearing.  ?HENT:  ?   Head: Normocephalic and atraumatic.  ?   Right Ear: Hearing, tympanic membrane, ear canal and  external ear normal. No drainage.  ?   Left Ear: Hearing, tympanic membrane, ear canal and external ear normal. No drainage.  ?   Nose: Nose normal.  ?   Mouth/Throat:  ?   Pharynx:  Uvula midline.  ?Eyes:  ?   General: Lids are normal.     ?   Right eye: No discharge.     ?   Left eye: No discharge.  ?   Extraocular Movements: Extraocular movements intact.  ?   Conjunctiva/sclera: Conjunctivae normal.  ?   Pupils: Pupils are equal, round, and reactive to light.  ?   Visual Fields: Right eye visual fields normal and left eye visual fields normal.  ?Neck:  ?   Thyroid: No thyromegaly.  ?   Vascular: No carotid bruit or JVD.  ?   Trachea: Trachea normal.  ?Cardiovascular:  ?   Rate and Rhythm: Normal rate and regular rhythm.  ?   Heart sounds: Normal heart sounds, S1 normal and S2 normal. No murmur heard. ?  No gallop.  ?Pulmonary:  ?   Effort: Pulmonary effort is normal. No accessory muscle usage or respiratory distress.  ?   Breath sounds: Normal breath sounds.  ?Abdominal:  ?   General: Bowel sounds are normal.  ?   Palpations: Abdomen is soft. There is no hepatomegaly or splenomegaly.  ?   Tenderness: There is no abdominal tenderness.  ?Musculoskeletal:     ?   General: Normal range of motion.  ?   Cervical back: Normal range of motion and neck supple.  ?   Right lower leg: No edema.  ?   Left lower leg: No edema.  ?Lymphadenopathy:  ?   Head:  ?   Right side of head: No submental, submandibular, tonsillar, preauricular or posterior auricular adenopathy.  ?   Left side of head: No submental, submandibular, tonsillar, preauricular or posterior auricular adenopathy.  ?   Cervical: No cervical adenopathy.  ?Skin: ?   General: Skin is warm and dry.  ?   Capillary Refill: Capillary refill takes less than 2 seconds.  ?   Findings: No rash.  ?Neurological:  ?   Mental Status: He is alert and oriented to person, place, and time.  ?   Gait: Gait is intact.  ?   Deep Tendon Reflexes: Reflexes are normal and symmetric.  ?    Reflex Scores: ?     Brachioradialis reflexes are 2+ on the right side and 2+ on the left side. ?     Patellar reflexes are 2+ on the right side and 2+ on the left side. ?Psychiatric:     ?   Attention and Percept

## 2021-07-25 NOTE — Assessment & Plan Note (Signed)
Continue collaboration with ENT. ?

## 2021-07-26 LAB — CBC WITH DIFFERENTIAL/PLATELET
Basophils Absolute: 0 10*3/uL (ref 0.0–0.2)
Basos: 1 %
EOS (ABSOLUTE): 0.3 10*3/uL (ref 0.0–0.4)
Eos: 4 %
Hematocrit: 46.1 % (ref 37.5–51.0)
Hemoglobin: 15.9 g/dL (ref 13.0–17.7)
Immature Grans (Abs): 0 10*3/uL (ref 0.0–0.1)
Immature Granulocytes: 0 %
Lymphocytes Absolute: 2.1 10*3/uL (ref 0.7–3.1)
Lymphs: 31 %
MCH: 30.7 pg (ref 26.6–33.0)
MCHC: 34.5 g/dL (ref 31.5–35.7)
MCV: 89 fL (ref 79–97)
Monocytes Absolute: 0.5 10*3/uL (ref 0.1–0.9)
Monocytes: 6 %
Neutrophils Absolute: 4.1 10*3/uL (ref 1.4–7.0)
Neutrophils: 58 %
Platelets: 230 10*3/uL (ref 150–450)
RBC: 5.18 x10E6/uL (ref 4.14–5.80)
RDW: 13.4 % (ref 11.6–15.4)
WBC: 7 10*3/uL (ref 3.4–10.8)

## 2021-07-26 LAB — COMPREHENSIVE METABOLIC PANEL
ALT: 26 IU/L (ref 0–44)
AST: 24 IU/L (ref 0–40)
Albumin/Globulin Ratio: 2 (ref 1.2–2.2)
Albumin: 4.5 g/dL (ref 4.0–5.0)
Alkaline Phosphatase: 86 IU/L (ref 44–121)
BUN/Creatinine Ratio: 16 (ref 9–20)
BUN: 16 mg/dL (ref 6–24)
Bilirubin Total: 0.5 mg/dL (ref 0.0–1.2)
CO2: 23 mmol/L (ref 20–29)
Calcium: 9.4 mg/dL (ref 8.7–10.2)
Chloride: 104 mmol/L (ref 96–106)
Creatinine, Ser: 0.99 mg/dL (ref 0.76–1.27)
Globulin, Total: 2.3 g/dL (ref 1.5–4.5)
Glucose: 81 mg/dL (ref 70–99)
Potassium: 4.3 mmol/L (ref 3.5–5.2)
Sodium: 141 mmol/L (ref 134–144)
Total Protein: 6.8 g/dL (ref 6.0–8.5)
eGFR: 96 mL/min/{1.73_m2} (ref >59)

## 2021-07-26 LAB — TSH: TSH: 2.38 u[IU]/mL (ref 0.450–4.500)

## 2021-07-26 LAB — LIPID PANEL W/O CHOL/HDL RATIO
Cholesterol, Total: 182 mg/dL (ref 100–199)
HDL: 60 mg/dL (ref >39)
LDL Chol Calc (NIH): 107 mg/dL — ABNORMAL HIGH (ref 0–99)
Triglycerides: 83 mg/dL (ref 0–149)
VLDL Cholesterol Cal: 15 mg/dL (ref 5–40)

## 2021-07-26 LAB — HEPATITIS C ANTIBODY: Hep C Virus Ab: NONREACTIVE

## 2021-07-26 NOTE — Progress Notes (Signed)
Contacted via MyChart   Good afternoon Jon Saunders, your imaging has returned and shows no pneumonia!!  [place] news!!!

## 2021-07-26 NOTE — Progress Notes (Signed)
Contacted via MyChart   Good morning Jon Saunders, your labs have returned and are overall nice and stable with exception of LDL.  Your LDL is above normal. The LDL is the bad cholesterol. Over time and in combination with inflammation and other factors, this contributes to plaque which in turn may lead to stroke and/or heart attack down the road. Sometimes high LDL is primarily genetic, and people might be eating all the right foods but still have high numbers. Other times, there is room for improvement in one's diet and eating healthier can bring this number down and potentially reduce one's risk of heart attack and/or stroke.   To reduce your LDL, Remember - more fruits and vegetables, more fish, and limit red meat and dairy products. More soy, nuts, beans, barley, lentils, oats and plant sterol ester enriched margarine instead of butter. I also encourage eliminating sugar and processed food. Remember, shop on the outside of the grocery store and visit your [employer]. If you would like to talk with me about dietary changes for your cholesterol, please let me know. We should recheck your cholesterol in 12 months.  Any questions? Keep being amazing!!  Thank you for allowing me to participate in your care.  I appreciate you. Kindest regards, Cheyann Blecha

## 2021-09-29 NOTE — Patient Instructions (Signed)

## 2021-10-01 ENCOUNTER — Encounter: Payer: Self-pay | Admitting: Nurse Practitioner

## 2021-10-01 ENCOUNTER — Ambulatory Visit: Payer: BC Managed Care – PPO | Admitting: Nurse Practitioner

## 2021-10-01 DIAGNOSIS — R051 Acute cough: Secondary | ICD-10-CM

## 2021-10-01 MED ORDER — PREDNISONE 20 MG PO TABS: 40.0000 mg | ORAL_TABLET | Freq: Every day | ORAL | 0 refills | Status: AC

## 2021-10-01 MED ORDER — AZITHROMYCIN 250 MG PO TABS: ORAL_TABLET | ORAL | 0 refills | Status: AC

## 2021-10-01 MED ORDER — HYDROCOD POLI-CHLORPHE POLI ER 10-8 MG/5ML PO SUER: 5.0000 mL | Freq: Two times a day (BID) | ORAL | 0 refills | Status: DC | PRN

## 2021-10-01 NOTE — Progress Notes (Signed)
BP 106/72   Pulse 68   Temp 98.1 F (36.7 C) (Oral)   Ht 5' 11"  (1.803 m)   Wt 224 lb 3.2 oz (101.7 kg)   SpO2 97%   BMI 31.27 kg/m    Subjective:    Patient ID: Jon Saunders, male    DOB: 1975/06/04, 46 y.o.   MRN: [id number]  HPI: Jon Saunders is a 46 y.o. male  Chief Complaint  Patient presents with   Cough    Patient says the weekend of July 4th, patient says that weekend he and his wife has a severe sinus infection as he assumed. Patient says the nasal congestion has gotten, but the cough is still lingering and the cough gets worse at night. Patient says he has been using an old inhaler prescription. Patient says it help a little, but not a lot. Patient says he has an old Rx for Tessalon and it did not help at all.    Sinus Problem   UPPER RESPIRATORY TRACT INFECTION Stayed at [employer] over July 4th weekend and was musty, both wife and him got sick after this.  Since this time he has had an ongoing, dry cough.  Once starts coughing at night it keeps going. Worst symptom: cough Fever: no Cough: yes Shortness of breath: no Wheezing: yes Chest pain: no Chest tightness: yes Chest congestion: no Nasal congestion: no not since last week, before this was congested Runny nose: no Post nasal drip: no Sneezing: no Sore throat: no Swollen glands: no Sinus pressure:  not anymore Headache: no Face pain: no Toothache: no Ear pain: none Ear pressure: none Eyes red/itching:no Eye drainage/crusting: no  Vomiting: no Rash: no Fatigue: yes Sick contacts: no Strep contacts: no  Context: fluctuating Recurrent sinusitis: no Relief with OTC cold/cough medications: Ibuprofen and inhaler  Treatments attempted: Tessalon and Albuterol (old inhaler)    Relevant past medical, surgical, family and social history reviewed and updated as indicated. Interim medical history since our last visit reviewed. Allergies and medications reviewed and updated.  Review of Systems  Constitutional:   Positive for fatigue. Negative for activity change, appetite change, chills, diaphoresis and fever.  HENT:  Negative for congestion, ear discharge, ear pain, hearing loss, postnasal drip, rhinorrhea, sinus pressure, sinus pain, sneezing and sore throat.   Respiratory:  Positive for cough, chest tightness and wheezing. Negative for shortness of breath.   Cardiovascular: Negative.   Gastrointestinal: Negative.   Neurological: Negative.   Psychiatric/Behavioral: Negative.      Per HPI unless specifically indicated above     Objective:    BP 106/72   Pulse 68   Temp 98.1 F (36.7 C) (Oral)   Ht 5' 11"  (1.803 m)   Wt 224 lb 3.2 oz (101.7 kg)   SpO2 97%   BMI 31.27 kg/m   Wt Readings from Last 3 Encounters:  10/01/21 224 lb 3.2 oz (101.7 kg)  07/25/21 222 lb 9.6 oz (101 kg)  05/16/21 225 lb 12.8 oz (102.4 kg)    Physical Exam Vitals and nursing note reviewed.  Constitutional:      General: He is awake. He is not in acute distress.    Appearance: He is well-developed and well-groomed. He is obese. He is not ill-appearing or toxic-appearing.  HENT:     Head: Normocephalic and atraumatic.     Right Ear: Hearing, tympanic membrane, ear canal and external ear normal. No drainage.     Left Ear: Hearing, tympanic membrane, ear canal and external  ear normal. No drainage.     Nose: Nose normal.     Right Sinus: No maxillary sinus tenderness or frontal sinus tenderness.     Left Sinus: No maxillary sinus tenderness or frontal sinus tenderness.     Mouth/Throat:     Mouth: Mucous membranes are moist.     Pharynx: Uvula midline. Posterior oropharyngeal erythema (mild with cobblestone appearance) present. No pharyngeal swelling or oropharyngeal exudate.  Eyes:     General: Lids are normal.        Right eye: No discharge.        Left eye: No discharge.     Conjunctiva/sclera: Conjunctivae normal.     Pupils: Pupils are equal, round, and reactive to light.  Neck:     Thyroid: No  thyromegaly.     Vascular: No carotid bruit or JVD.     Trachea: Trachea normal.  Cardiovascular:     Rate and Rhythm: Normal rate and regular rhythm.     Heart sounds: Normal heart sounds, S1 normal and S2 normal. No murmur heard.    No gallop.  Pulmonary:     Effort: Pulmonary effort is normal. No accessory muscle usage or respiratory distress.     Breath sounds: Wheezing present. No decreased breath sounds or rhonchi.     Comments: Intermittent expiratory wheezes noted throughout.  Abdominal:     General: Bowel sounds are normal.     Palpations: Abdomen is soft. There is no hepatomegaly or splenomegaly.  Musculoskeletal:        General: Normal range of motion.     Cervical back: Normal range of motion and neck supple.     Right lower leg: No edema.     Left lower leg: No edema.  Skin:    General: Skin is warm and dry.     Capillary Refill: Capillary refill takes less than 2 seconds.  Neurological:     Mental Status: He is alert and oriented to person, place, and time.     Deep Tendon Reflexes: Reflexes are normal and symmetric.  Psychiatric:        Attention and Perception: Attention normal.        Mood and Affect: Mood normal.        Speech: Speech normal.        Behavior: Behavior normal. Behavior is cooperative.        Thought Content: Thought content normal.     Results for orders placed or performed in visit on 07/25/21  CBC with Differential/Platelet  Result Value Ref Range   WBC 7.0 3.4 - 10.8 x10E3/uL   RBC 5.18 4.14 - 5.80 x10E6/uL   Hemoglobin 15.9 13.0 - 17.7 g/dL   Hematocrit 46.1 37.5 - 51.0 %   MCV 89 79 - 97 fL   MCH 30.7 26.6 - 33.0 pg   MCHC 34.5 31.5 - 35.7 g/dL   RDW 13.4 11.6 - 15.4 %   Platelets 230 150 - 450 x10E3/uL   Neutrophils 58 Not Estab. %   Lymphs 31 Not Estab. %   Monocytes 6 Not Estab. %   Eos 4 Not Estab. %   Basos 1 Not Estab. %   Neutrophils Absolute 4.1 1.4 - 7.0 x10E3/uL   Lymphocytes Absolute 2.1 0.7 - 3.1 x10E3/uL    Monocytes Absolute 0.5 0.1 - 0.9 x10E3/uL   EOS (ABSOLUTE) 0.3 0.0 - 0.4 x10E3/uL   Basophils Absolute 0.0 0.0 - 0.2 x10E3/uL   Immature Granulocytes 0 Not Estab. %  Immature Grans (Abs) 0.0 0.0 - 0.1 x10E3/uL  Comprehensive metabolic panel  Result Value Ref Range   Glucose 81 70 - 99 mg/dL   BUN 16 6 - 24 mg/dL   Creatinine, Ser 0.99 0.76 - 1.27 mg/dL   eGFR 96 >59 mL/min/1.73   BUN/Creatinine Ratio 16 9 - 20   Sodium 141 134 - 144 mmol/L   Potassium 4.3 3.5 - 5.2 mmol/L   Chloride 104 96 - 106 mmol/L   CO2 23 20 - 29 mmol/L   Calcium 9.4 8.7 - 10.2 mg/dL   Total Protein 6.8 6.0 - 8.5 g/dL   Albumin 4.5 4.0 - 5.0 g/dL   Globulin, Total 2.3 1.5 - 4.5 g/dL   Albumin/Globulin Ratio 2.0 1.2 - 2.2   Bilirubin Total 0.5 0.0 - 1.2 mg/dL   Alkaline Phosphatase 86 44 - 121 IU/L   AST 24 0 - 40 IU/L   ALT 26 0 - 44 IU/L  Lipid Panel w/o Chol/HDL Ratio  Result Value Ref Range   Cholesterol, Total 182 100 - 199 mg/dL   Triglycerides 83 0 - 149 mg/dL   HDL 60 >39 mg/dL   VLDL Cholesterol Cal 15 5 - 40 mg/dL   LDL Chol Calc (NIH) 107 (H) 0 - 99 mg/dL  TSH  Result Value Ref Range   TSH 2.380 0.450 - 4.500 uIU/mL  Hepatitis C antibody  Result Value Ref Range   Hep C Virus Ab Non Reactive Non Reactive      Assessment & Plan:   Problem List Items Addressed This Visit       Other   Cough    Acute and ongoing for 3 weeks.  Underlying allergy and sinus issues, continue current OTC medication regimen and ENT collaboration.  Will start Prednisone 40 MG daily for 5 days + Tussionex and Albuterol as needed + Zpack sent in.  Return to office if worsening or ongoing symptoms.  Recommend: - Increased rest - Increasing Fluids - Acetaminophen as needed for fever/pain.  - Mucinex.  - Humidifying the air.        Follow up plan: Return if symptoms worsen or fail to improve.

## 2021-10-01 NOTE — Assessment & Plan Note (Signed)
Acute and ongoing for 3 weeks.  Underlying allergy and sinus issues, continue current OTC medication regimen and ENT collaboration.  Will start Prednisone 40 MG daily for 5 days + Tussionex and Albuterol as needed + Zpack sent in.  Return to office if worsening or ongoing symptoms.  Recommend: - Increased rest - Increasing Fluids - Acetaminophen as needed for fever/pain.  - Mucinex.  - Humidifying the air.

## 2021-11-15 ENCOUNTER — Ambulatory Visit: Payer: BC Managed Care – PPO | Admitting: Dermatology

## 2022-03-21 ENCOUNTER — Other Ambulatory Visit: Payer: Self-pay | Admitting: Nurse Practitioner

## 2022-03-21 NOTE — Telephone Encounter (Signed)
Requested Prescriptions  Pending Prescriptions Disp Refills   fluticasone (FLONASE) 50 MCG/ACT nasal spray [Pharmacy Med Name: FLUTICASONE PROP 50 MCG SPRAY] 16 g 0    Sig: SPRAY 2 SPRAYS INTO EACH NOSTRIL EVERY DAY     Ear, Nose, and Throat: Nasal Preparations - Corticosteroids Passed - 03/21/2022  1:37 AM      Passed - Valid encounter within last 12 months    Recent Outpatient Visits           5 months ago Acute cough   [place], [place] T, NP   7 months ago Depression with anxiety   [place], Barbaraann Faster, NP   10 months ago Skin tag   [place], Henrine Screws T, NP   1 year ago [place], Henrine Screws T, NP   1 year ago History of [employer] spotted fever   [place], [place] T, NP       Future Appointments             In 4 months [place], Barbaraann Faster, NP [employer], [facility]

## 2022-04-19 ENCOUNTER — Other Ambulatory Visit: Payer: Self-pay | Admitting: Nurse Practitioner

## 2022-04-19 NOTE — Telephone Encounter (Signed)
Requested Prescriptions  Pending Prescriptions Disp Refills   fluticasone (FLONASE) 50 MCG/ACT nasal spray [Pharmacy Med Name: FLUTICASONE PROP 50 MCG SPRAY] 48 mL 0    Sig: SPRAY 2 SPRAYS INTO EACH NOSTRIL EVERY DAY     Ear, Nose, and Throat: Nasal Preparations - Corticosteroids Passed - 04/19/2022 12:30 PM      Passed - Valid encounter within last 12 months    Recent Outpatient Visits           6 months ago Acute cough   [place], [place] T, NP   8 months ago Depression with anxiety   [place], [place] T, NP   11 months ago Skin tag   [place], Henrine Screws T, NP   1 year ago [place], Henrine Screws T, NP   1 year ago History of [facility] spotted fever   [place], Henrine Screws T, NP       Future Appointments             In 3 months Cannady, Barbaraann Faster, NP [place], [facility]

## 2022-07-29 ENCOUNTER — Encounter: Payer: BC Managed Care – PPO | Admitting: Nurse Practitioner

## 2022-08-06 ENCOUNTER — Encounter: Payer: Self-pay | Admitting: Nurse Practitioner

## 2022-08-22 ENCOUNTER — Other Ambulatory Visit: Payer: Self-pay | Admitting: Nurse Practitioner

## 2022-08-22 NOTE — Telephone Encounter (Signed)
Requested medication (s) are due for refill today: expired medication   Requested medication (s) are on the active medication list: yes   Last refill:  07/25/21 #90 4 refills   Future visit scheduled: yes in 4 days   Notes to clinic:  expired medication. Do you want to renew Rx?     Requested Prescriptions  Pending Prescriptions Disp Refills   FLUoxetine (PROZAC) 40 MG capsule [Pharmacy Med Name: FLUOXETINE HCL 40 MG CAPSULE] 90 capsule 4    Sig: TAKE 1 CAPSULE BY MOUTH EVERY DAY     Psychiatry:  Antidepressants - SSRI Failed - 08/22/2022  8:42 AM      Failed - Valid encounter within last 6 months    Recent Outpatient Visits           10 months ago Acute cough   [place] [facility] [place], Corrie Dandy T, NP   1 year ago Depression with anxiety   [place] [facility] [place], Corrie Dandy T, NP   1 year ago Skin tag   [place] [facility] [place], Corrie Dandy T, NP   1 year ago Dizziness   [place] [facility] [place], Corrie Dandy T, NP   1 year ago History of [facility] spotted fever   [place] [facility] [place], [place] T, NP       Future Appointments             In 4 days [place], Dorie Rank, NP [place] [facility]            Passed - Completed PHQ-2 or PHQ-9 in the last 360 days

## 2022-08-25 DIAGNOSIS — G4733 Obstructive sleep apnea (adult) (pediatric): Secondary | ICD-10-CM | POA: Insufficient documentation

## 2022-08-25 DIAGNOSIS — E7800 Pure hypercholesterolemia, unspecified: Secondary | ICD-10-CM | POA: Insufficient documentation

## 2022-08-25 NOTE — Patient Instructions (Signed)
Managing Depression, Adult Depression is a mental health condition that affects your thoughts, feelings, and actions. Being diagnosed with depression can bring you relief if you did not know why you have felt or behaved a certain way. It could also leave you feeling overwhelmed. Finding ways to manage your symptoms can help you feel more positive about your future. How to manage lifestyle changes Being depressed is difficult. Depression can increase the level of everyday stress. Stress can make depression symptoms worse. You may believe your symptoms cannot be managed or will never improve. However, there are many things you can try to help manage your symptoms. There is hope. Managing stress  Stress is your body's reaction to life changes and events, both good and bad. Stress can add to your feelings of depression. Learning to manage your stress can help lessen your feelings of depression. Try some of the following approaches to reducing your stress (stress reduction techniques): Listen to music that you enjoy and that inspires you. Try using a meditation app or take a meditation class. Develop a practice that helps you connect with your spiritual self. Walk in nature, pray, or go to a place of worship. Practice deep breathing. To do this, inhale slowly through your nose. Pause at the top of your inhale for a few seconds and then exhale slowly, letting yourself relax. Repeat this three or four times. Practice yoga to help relax and work your muscles. Choose a stress reduction technique that works for you. These techniques take time and practice to develop. Set aside 5-15 minutes a day to do them. Therapists can offer training in these techniques. Do these things to help manage stress: Keep a journal. Know your limits. Set healthy boundaries for yourself and others, such as saying "no" when you think something is too much. Pay attention to how you react to certain situations. You may not be able to  control everything, but you can change your reaction. Add humor to your life by watching funny movies or shows. Make time for activities that you enjoy and that relax you. Spend less time using electronics, especially at night before bed. The light from screens can make your brain think it is time to get up rather than go to bed.  Medicines Medicines, such as antidepressants, are often a part of treatment for depression. Talk with your pharmacist or health care provider about all the medicines, supplements, and herbal products that you take, their possible side effects, and what medicines and other products are safe to take together. Make sure to report any side effects you may have to your health care provider. Relationships Your health care provider may suggest family therapy, couples therapy, or individual therapy as part of your treatment. How to recognize changes Everyone responds differently to treatment for depression. As you recover from depression, you may start to: Have more interest in doing activities. Feel more hopeful. Have more energy. Eat a more regular amount of food. Have better mental focus. It is important to recognize if your depression is not getting better or is getting worse. The symptoms you had in the beginning may return, such as: Feeling tired. Eating too much or too little. Sleeping too much or too little. Feeling restless, agitated, or hopeless. Trouble focusing or making decisions. Having unexplained aches and pains. Feeling irritable, angry, or aggressive. If you or your family members notice these symptoms coming back, let your health care provider know right away. Follow these instructions at home: Activity Try to   get some form of exercise each day, such as walking. Try yoga, mindfulness, or other stress reduction techniques. Participate in group activities if you are able. Lifestyle Get enough sleep. Cut down on or stop using caffeine, tobacco,  alcohol, and any other harmful substances. Eat a healthy diet that includes plenty of vegetables, fruits, whole grains, low-fat dairy products, and lean protein. Limit foods that are high in solid fats, added sugar, or salt (sodium). General instructions Take over-the-counter and prescription medicines only as told by your health care provider. Keep all follow-up visits. It is important for your health care provider to check on your mood, behavior, and medicines. Your health care provider may need to make changes to your treatment. Where to find support Talking to others  Friends and family members can be sources of support and guidance. Talk to trusted friends or family members about your condition. Explain your symptoms and let them know that you are working with a health care provider to treat your depression. Tell friends and family how they can help. Finances Find mental health providers that fit with your financial situation. Talk with your health care provider if you are worried about access to food, housing, or medicine. Call your insurance company to learn about your co-pays and prescription plan. Where to find more information You can find support in your area from: Anxiety and Depression Association of America (ADAA): adaa.org Mental Health America: mentalhealthamerica.net National Alliance on Mental Illness: nami.org Contact a health care provider if: You stop taking your antidepressant medicines, and you have any of these symptoms: Nausea. Headache. Light-headedness. Chills and body aches. Not being able to sleep (insomnia). You or your friends and family think your depression is getting worse. Get help right away if: You have thoughts of hurting yourself or others. Get help right away if you feel like you may hurt yourself or others, or have thoughts about taking your own life. Go to your nearest emergency room or: Call 911. Call the National Suicide Prevention Lifeline at  1-800-273-8255 or 988. This is open 24 hours a day. Text the Crisis Text Line at 741741. This information is not intended to replace advice given to you by your health care provider. Make sure you discuss any questions you have with your health care provider. Document Revised: 07/03/2021 Document Reviewed: 07/03/2021 Elsevier Patient Education  2024 Elsevier Inc.  

## 2022-08-26 ENCOUNTER — Ambulatory Visit (INDEPENDENT_AMBULATORY_CARE_PROVIDER_SITE_OTHER): Payer: BC Managed Care – PPO | Admitting: Nurse Practitioner

## 2022-08-26 ENCOUNTER — Encounter: Payer: Self-pay | Admitting: Nurse Practitioner

## 2022-08-26 VITALS — BP 110/75 | HR 73 | Temp 98.2°F | Ht 70.98 in | Wt 236.2 lb

## 2022-08-26 DIAGNOSIS — F418 Other specified anxiety disorders: Secondary | ICD-10-CM

## 2022-08-26 DIAGNOSIS — G4733 Obstructive sleep apnea (adult) (pediatric): Secondary | ICD-10-CM | POA: Diagnosis not present

## 2022-08-26 DIAGNOSIS — Z6831 Body mass index (BMI) 31.0-31.9, adult: Secondary | ICD-10-CM

## 2022-08-26 DIAGNOSIS — E7800 Pure hypercholesterolemia, unspecified: Secondary | ICD-10-CM | POA: Diagnosis not present

## 2022-08-26 DIAGNOSIS — J324 Chronic pansinusitis: Secondary | ICD-10-CM

## 2022-08-26 DIAGNOSIS — Z0000 Encounter for general adult medical examination without abnormal findings: Secondary | ICD-10-CM | POA: Diagnosis not present

## 2022-08-26 DIAGNOSIS — Z1211 Encounter for screening for malignant neoplasm of colon: Secondary | ICD-10-CM | POA: Diagnosis not present

## 2022-08-26 DIAGNOSIS — E6609 Other obesity due to excess calories: Secondary | ICD-10-CM | POA: Diagnosis not present

## 2022-08-26 MED ORDER — FLUOXETINE HCL 40 MG PO CAPS: 40.0000 mg | ORAL_CAPSULE | Freq: Every day | ORAL | 4 refills | Status: DC

## 2022-08-26 MED ORDER — FLUTICASONE PROPIONATE 50 MCG/ACT NA SUSP: NASAL | 5 refills | Status: DC

## 2022-08-26 NOTE — Assessment & Plan Note (Signed)
Chronic, stable with CPAP use regularly.  Needs new supplies for CPAP machine.  Will work on obtaining these.

## 2022-08-26 NOTE — Assessment & Plan Note (Addendum)
Continue collaboration with ENT as needed.

## 2022-08-26 NOTE — Assessment & Plan Note (Signed)
BMI 32.96.  Recommended eating smaller high protein, low fat meals more frequently and exercising 30 mins a day 5 times a week with a goal of 10-15lb weight loss in the next 3 months. Patient voiced their understanding and motivation to adhere to these recommendations. ? ?

## 2022-08-26 NOTE — Assessment & Plan Note (Signed)
Noted on past labs.  Continue focus on diet and regular activity. Recheck labs today. The 10-year ASCVD risk score (Arnett DK, et al., 2019) is: 1.1%   Values used to calculate the score:     Age: 47 years     Sex: Male     Is Non-Hispanic African American: No     Diabetic: No     Tobacco smoker: No     Systolic Blood Pressure: 110 mmHg     Is BP treated: No     HDL Cholesterol: 60 mg/dL     Total Cholesterol: 182 mg/dL

## 2022-08-26 NOTE — Progress Notes (Signed)
BP 110/75   Pulse 73   Temp 98.2 F (36.8 C) (Oral)   Ht 5' 10.98" (1.803 m)   Wt 236 lb 3.2 oz (107.1 kg)   SpO2 97%   BMI 32.96 kg/m    Subjective:    Patient ID: Jon Saunders, male    DOB: [birth year], 47 y.o.   MRN: [id number]  HPI: Jon Saunders is a 47 y.o. male presenting on 08/26/2022 for comprehensive medical examination. Current medical complaints include:none  He currently lives with: wife Interim Problems from his last visit: no  Continues on Prozac for mood with overall benefit. Denies SI/HI.  SLEEP APNEA Needs new head gear and filters for CPAP machine.  Wears a P10 and pillows that go with this + filters.  Last sleep study was in 2017.  The machine works well. When had sinus surgery was able to use equipment without issue anymore.   Sleep apnea status: stable Duration: months Satisfied with current treatment?:  yes CPAP use:  yes Sleep quality with CPAP use: excellent Treament compliance:good compliance Last sleep study: 2017 Treatments attempted: as above Wakes feeling refreshed:  yes Daytime hypersomnolence:  no Fatigue:  no Insomnia:  no Good sleep hygiene:  no Difficulty falling asleep:  no Difficulty staying asleep:  no Snoring bothers bed partner:  no Observed apnea by bed partner: no Obesity:  yes Hypertension: no  Pulmonary hypertension:  no Coronary artery disease:  no   Functional Status Survey: Is the patient deaf or have difficulty hearing?: No Does the patient have difficulty seeing, even when wearing glasses/contacts?: No Does the patient have difficulty concentrating, remembering, or making decisions?: No Does the patient have difficulty walking or climbing stairs?: No Does the patient have difficulty dressing or bathing?: No Does the patient have difficulty doing errands alone such as visiting a doctor's office or shopping?: No  FALL RISK:    08/26/2022    3:49 PM 10/01/2021    3:31 PM 07/25/2021    8:37 AM 07/05/2019    3:00 PM  05/18/2019   10:36 AM  Fall Risk   Falls in the past year? 0 0 0 0 0  Number falls in past yr: 0 0 0 0 0  Injury with Fall? 0 0 0 0 0  Risk for fall due to : No Fall Risks No Fall Risks No Fall Risks    Follow up Falls evaluation completed Falls evaluation completed Falls evaluation completed Falls evaluation completed    Depression Screen    08/26/2022    3:49 PM 10/01/2021    3:31 PM 07/25/2021    8:37 AM 07/05/2019    3:00 PM 05/18/2019   10:37 AM  Depression screen PHQ 2/9  Decreased Interest 0 0 0 0 0  Down, Depressed, Hopeless 0 0 0 0 0  PHQ - 2 Score 0 0 0 0 0  Altered sleeping 1 1 0 0   Tired, decreased energy 1 1 1 1    Change in appetite 0 0 0 0   Feeling bad or failure about yourself  0 0 0 0   Trouble concentrating 0 0 0 0   Moving slowly or fidgety/restless 0 0 0 0   Suicidal thoughts 0 0 0 0   PHQ-9 Score 2 2 1 1    Difficult doing work/chores Not difficult at all Not difficult at all  Not difficult at all       08/26/2022    3:49 PM 10/01/2021    3:32 PM  07/25/2021    8:37 AM 07/05/2019    3:00 PM  GAD 7 : Generalized Anxiety Score  Nervous, Anxious, on Edge 0 0 0 1  Control/stop worrying 0 0 0 0  Worry too much - different things 0 0 0 0  Trouble relaxing 0 0 0 0  Restless 0 0 0 0  Easily annoyed or irritable 0 0 0 0  Afraid - awful might happen 0 0 0 0  Total GAD 7 Score 0 0 0 1  Anxiety Difficulty Not difficult at all Not difficult at all Not difficult at all Not difficult at all   Advanced Directives <no information>  Past Medical History:  Past Medical History:  Diagnosis Date   Allergy    Anxiety    Meningitis    [place] spotted fever     Surgical History:  Past Surgical History:  Procedure Laterality Date   WISDOM TOOTH EXTRACTION      Medications:  No current outpatient medications on file prior to visit.   No current facility-administered medications on file prior to visit.    Allergies:  No Known Allergies  Social History:   Social History   Socioeconomic History   Marital status: Married    Spouse name: Not on file   Number of children: Not on file   Years of education: Not on file   Highest education level: Not on file  Occupational History   Occupation: sales rep  Tobacco Use   Smoking status: Never   Smokeless tobacco: Never  Vaping Use   Vaping Use: Never used  Substance and Sexual Activity   Alcohol use: Yes    Alcohol/week: 4.0 standard drinks of alcohol    Types: 1 Glasses of wine, 3 Standard drinks or equivalent per week    Comment: every couple days    Drug use: Never   Sexual activity: Yes  Other Topics Concern   Not on file  Social History Narrative   Not on file   Social Determinants of Health   Financial Resource Strain: Not on file  Food Insecurity: Not on file  Transportation Needs: Not on file  Physical Activity: Not on file  Stress: Not on file  Social Connections: Not on file  Intimate Partner Violence: Not on file   Social History   Tobacco Use  Smoking Status Never  Smokeless Tobacco Never   Social History   Substance and Sexual Activity  Alcohol Use Yes   Alcohol/week: 4.0 standard drinks of alcohol   Types: 1 Glasses of wine, 3 Standard drinks or equivalent per week   Comment: every couple days     Family History:  Family History  Problem Relation Age of Onset   Hypertension Mother    Epilepsy Mother    Seizures Mother    Migraines Father    Alzheimer's disease Father    Epilepsy Sister    Seizures Sister    Ovarian cancer Maternal Grandmother     Past medical history, surgical history, medications, allergies, family history and social history reviewed with patient today and changes made to appropriate areas of the chart.   Review of Systems - negative All other ROS negative except what is listed above and in the HPI.      Objective:    BP 110/75   Pulse 73   Temp 98.2 F (36.8 C) (Oral)   Ht 5' 10.98" (1.803 m)   Wt 236 lb 3.2 oz  (107.1 kg)   SpO2  97%   BMI 32.96 kg/m   Wt Readings from Last 3 Encounters:  08/26/22 236 lb 3.2 oz (107.1 kg)  10/01/21 224 lb 3.2 oz (101.7 kg)  07/25/21 222 lb 9.6 oz (101 kg)    Physical Exam Vitals and nursing note reviewed.  Constitutional:      General: He is awake. He is not in acute distress.    Appearance: He is well-developed and well-groomed. He is obese. He is not ill-appearing or toxic-appearing.  HENT:     Head: Normocephalic and atraumatic.     Right Ear: Hearing, tympanic membrane, ear canal and external ear normal. No drainage.     Left Ear: Hearing, tympanic membrane, ear canal and external ear normal. No drainage.     Nose: Nose normal.     Mouth/Throat:     Pharynx: Uvula midline.  Eyes:     General: Lids are normal.        Right eye: No discharge.        Left eye: No discharge.     Extraocular Movements: Extraocular movements intact.     Conjunctiva/sclera: Conjunctivae normal.     Pupils: Pupils are equal, round, and reactive to light.     Visual Fields: Right eye visual fields normal and left eye visual fields normal.  Neck:     Thyroid: No thyromegaly.     Vascular: No carotid bruit or JVD.     Trachea: Trachea normal.  Cardiovascular:     Rate and Rhythm: Normal rate and regular rhythm.     Heart sounds: Normal heart sounds, S1 normal and S2 normal. No murmur heard.    No gallop.  Pulmonary:     Effort: Pulmonary effort is normal. No accessory muscle usage or respiratory distress.     Breath sounds: Normal breath sounds.  Abdominal:     General: Bowel sounds are normal.     Palpations: Abdomen is soft. There is no hepatomegaly or splenomegaly.     Tenderness: There is no abdominal tenderness.  Musculoskeletal:        General: Normal range of motion.     Cervical back: Normal range of motion and neck supple.     Right lower leg: No edema.     Left lower leg: No edema.  Lymphadenopathy:     Head:     Right side of head: No submental,  submandibular, tonsillar, preauricular or posterior auricular adenopathy.     Left side of head: No submental, submandibular, tonsillar, preauricular or posterior auricular adenopathy.     Cervical: No cervical adenopathy.  Skin:    General: Skin is warm and dry.     Capillary Refill: Capillary refill takes less than 2 seconds.     Findings: No rash.  Neurological:     Mental Status: He is alert and oriented to person, place, and time.     Gait: Gait is intact.     Deep Tendon Reflexes: Reflexes are normal and symmetric.     Reflex Scores:      Brachioradialis reflexes are 2+ on the right side and 2+ on the left side.      Patellar reflexes are 2+ on the right side and 2+ on the left side. Psychiatric:        Attention and Perception: Attention normal.        Mood and Affect: Mood normal.        Speech: Speech normal.        Behavior: Behavior  normal. Behavior is cooperative.        Thought Content: Thought content normal.        Cognition and Memory: Cognition normal.        Judgment: Judgment normal.    Results for orders placed or performed in visit on 07/25/21  CBC with Differential/Platelet  Result Value Ref Range   WBC 7.0 3.4 - 10.8 x10E3/uL   RBC 5.18 4.14 - 5.80 x10E6/uL   Hemoglobin 15.9 13.0 - 17.7 g/dL   Hematocrit 09.8 11.9 - 51.0 %   MCV 89 79 - 97 fL   MCH 30.7 26.6 - 33.0 pg   MCHC 34.5 31.5 - 35.7 g/dL   RDW 14.7 82.9 - 56.2 %   Platelets 230 150 - 450 x10E3/uL   Neutrophils 58 Not Estab. %   Lymphs 31 Not Estab. %   Monocytes 6 Not Estab. %   Eos 4 Not Estab. %   Basos 1 Not Estab. %   Neutrophils Absolute 4.1 1.4 - 7.0 x10E3/uL   Lymphocytes Absolute 2.1 0.7 - 3.1 x10E3/uL   Monocytes Absolute 0.5 0.1 - 0.9 x10E3/uL   EOS (ABSOLUTE) 0.3 0.0 - 0.4 x10E3/uL   Basophils Absolute 0.0 0.0 - 0.2 x10E3/uL   Immature Granulocytes 0 Not Estab. %   Immature Grans (Abs) 0.0 0.0 - 0.1 x10E3/uL  Comprehensive metabolic panel  Result Value Ref Range   Glucose 81  70 - 99 mg/dL   BUN 16 6 - 24 mg/dL   Creatinine, Ser 1.30 0.76 - 1.27 mg/dL   eGFR 96 >86 VH/QIO/9.62   BUN/Creatinine Ratio 16 9 - 20   Sodium 141 134 - 144 mmol/L   Potassium 4.3 3.5 - 5.2 mmol/L   Chloride 104 96 - 106 mmol/L   CO2 23 20 - 29 mmol/L   Calcium 9.4 8.7 - 10.2 mg/dL   Total Protein 6.8 6.0 - 8.5 g/dL   Albumin 4.5 4.0 - 5.0 g/dL   Globulin, Total 2.3 1.5 - 4.5 g/dL   Albumin/Globulin Ratio 2.0 1.2 - 2.2   Bilirubin Total 0.5 0.0 - 1.2 mg/dL   Alkaline Phosphatase 86 44 - 121 IU/L   AST 24 0 - 40 IU/L   ALT 26 0 - 44 IU/L  Lipid Panel w/o Chol/HDL Ratio  Result Value Ref Range   Cholesterol, Total 182 100 - 199 mg/dL   Triglycerides 83 0 - 149 mg/dL   HDL 60 >95 mg/dL   VLDL Cholesterol Cal 15 5 - 40 mg/dL   LDL Chol Calc (NIH) 284 (H) 0 - 99 mg/dL  TSH  Result Value Ref Range   TSH 2.380 0.450 - 4.500 uIU/mL  Hepatitis C antibody  Result Value Ref Range   Hep C Virus Ab Non Reactive Non Reactive      Assessment & Plan:   Problem List Items Addressed This Visit       Respiratory   Chronic pansinusitis    Continue collaboration with ENT as needed.      Relevant Medications   fluticasone (FLONASE) 50 MCG/ACT nasal spray   OSA on CPAP    Chronic, stable with CPAP use regularly.  Needs new supplies for CPAP machine.  Will work on obtaining these.        Relevant Orders   CBC with Differential/Platelet   Comprehensive metabolic panel   For home use only DME Other see comment     Other   Depression with anxiety - Primary (Chronic)  Chronic, ongoing.  Denies SI/HI.  Continue current medication regimen and adjust as needed.  Refills sent.      Relevant Medications   FLUoxetine (PROZAC) 40 MG capsule   Other Relevant Orders   TSH   Elevated low density lipoprotein (LDL) cholesterol level    Noted on past labs.  Continue focus on diet and regular activity. Recheck labs today. The 10-year ASCVD risk score (Arnett DK, et al., 2019) is: 1.1%    Values used to calculate the score:     Age: 85 years     Sex: Male     Is Non-Hispanic African American: No     Diabetic: No     Tobacco smoker: No     Systolic Blood Pressure: 110 mmHg     Is BP treated: No     HDL Cholesterol: 60 mg/dL     Total Cholesterol: 182 mg/dL       Relevant Orders   Comprehensive metabolic panel   Lipid Panel w/o Chol/HDL Ratio   Obesity    BMI 32.96.  Recommended eating smaller high protein, low fat meals more frequently and exercising 30 mins a day 5 times a week with a goal of 10-15lb weight loss in the next 3 months. Patient voiced their understanding and motivation to adhere to these recommendations.       Relevant Orders   TSH   Other Visit Diagnoses     Colon cancer screening       GI referral placed.   Relevant Orders   Ambulatory referral to Gastroenterology   Encounter for annual physical exam       Annual physical today with labs and health maintenance reviewed, discussed with patient.       Discussed aspirin prophylaxis for myocardial infarction prevention and decision was it was not indicated  LABORATORY TESTING:  Health maintenance labs ordered today as discussed above.  No family history of Prostate CA -- will start labs PSA at 50, unless symptomatic beforehand.  IMMUNIZATIONS:   - Tdap: Tetanus vaccination status reviewed: last tetanus booster within 10 years. - Influenza: Up to date - Pneumovax: Not applicable - Prevnar: Not applicable - Zostavax vaccine: Not applicable - Covid -- up to date  SCREENING: - Colonoscopy: Ordered today Discussed with patient purpose of the colonoscopy is to detect colon cancer at curable precancerous or early stages   - AAA Screening: Not applicable  -Hearing Test: Not applicable  -Spirometry: Not applicable   PATIENT COUNSELING:    Sexuality: Discussed sexually transmitted diseases, partner selection, use of condoms, avoidance of unintended pregnancy  and contraceptive alternatives.    Advised to avoid cigarette smoking.  I discussed with the patient that most people either abstain from alcohol or drink within safe limits (<=14/week and <=4 drinks/occasion for males, <=7/weeks and <= 3 drinks/occasion for females) and that the risk for alcohol disorders and other health effects rises proportionally with the number of drinks per week and how often a drinker exceeds daily limits.  Discussed cessation/primary prevention of drug use and availability of treatment for abuse.   Diet: Encouraged to adjust caloric intake to maintain  or achieve ideal body weight, to reduce intake of dietary saturated fat and total fat, to limit sodium intake by avoiding high sodium foods and not adding table salt, and to maintain adequate dietary potassium and calcium preferably from fresh fruits, vegetables, and low-fat dairy products.    Stressed the importance of regular exercise  Injury prevention: Discussed safety belts,  safety helmets, smoke detector, smoking near bedding or upholstery.   Dental health: Discussed importance of regular tooth brushing, flossing, and dental visits.   Follow up plan: NEXT PREVENTATIVE PHYSICAL DUE IN 1 YEAR. Return in about 1 year (around 08/26/2023) for Annual physical.

## 2022-08-26 NOTE — Assessment & Plan Note (Signed)
Chronic, ongoing.  Denies SI/HI.  Continue current medication regimen and adjust as needed.  Refills sent. 

## 2022-08-27 LAB — COMPREHENSIVE METABOLIC PANEL
ALT: 20 IU/L (ref 0–44)
AST: 20 IU/L (ref 0–40)
Albumin: 4.4 g/dL (ref 4.1–5.1)
Alkaline Phosphatase: 81 IU/L (ref 44–121)
BUN/Creatinine Ratio: 12 (ref 9–20)
BUN: 13 mg/dL (ref 6–24)
Bilirubin Total: 0.3 mg/dL (ref 0.0–1.2)
CO2: 24 mmol/L (ref 20–29)
Calcium: 9.5 mg/dL (ref 8.7–10.2)
Chloride: 103 mmol/L (ref 96–106)
Creatinine, Ser: 1.06 mg/dL (ref 0.76–1.27)
Globulin, Total: 2.6 g/dL (ref 1.5–4.5)
Glucose: 88 mg/dL (ref 70–99)
Potassium: 3.9 mmol/L (ref 3.5–5.2)
Sodium: 139 mmol/L (ref 134–144)
Total Protein: 7 g/dL (ref 6.0–8.5)
eGFR: 88 mL/min/{1.73_m2} (ref >59)

## 2022-08-27 LAB — CBC WITH DIFFERENTIAL/PLATELET
Basophils Absolute: 0 10*3/uL (ref 0.0–0.2)
Basos: 0 %
EOS (ABSOLUTE): 0.4 10*3/uL (ref 0.0–0.4)
Eos: 4 %
Hematocrit: 43.8 % (ref 37.5–51.0)
Hemoglobin: 15.2 g/dL (ref 13.0–17.7)
Immature Grans (Abs): 0 10*3/uL (ref 0.0–0.1)
Immature Granulocytes: 0 %
Lymphocytes Absolute: 2.4 10*3/uL (ref 0.7–3.1)
Lymphs: 26 %
MCH: 31.1 pg (ref 26.6–33.0)
MCHC: 34.7 g/dL (ref 31.5–35.7)
MCV: 90 fL (ref 79–97)
Monocytes Absolute: 0.7 10*3/uL (ref 0.1–0.9)
Monocytes: 7 %
Neutrophils Absolute: 5.9 10*3/uL (ref 1.4–7.0)
Neutrophils: 63 %
Platelets: 215 10*3/uL (ref 150–450)
RBC: 4.88 x10E6/uL (ref 4.14–5.80)
RDW: 12.9 % (ref 11.6–15.4)
WBC: 9.4 10*3/uL (ref 3.4–10.8)

## 2022-08-27 LAB — LIPID PANEL W/O CHOL/HDL RATIO
Cholesterol, Total: 190 mg/dL (ref 100–199)
HDL: 58 mg/dL (ref >39)
LDL Chol Calc (NIH): 100 mg/dL — ABNORMAL HIGH (ref 0–99)
Triglycerides: 189 mg/dL — ABNORMAL HIGH (ref 0–149)
VLDL Cholesterol Cal: 32 mg/dL (ref 5–40)

## 2022-08-27 LAB — TSH: TSH: 2.2 u[IU]/mL (ref 0.450–4.500)

## 2022-08-27 NOTE — Progress Notes (Signed)
Contacted via MyChart   Good morning Jon Saunders, your labs have returned and overall these are stable.  The only thing that remains off is the LDL (bad cholesterol) and triglycerides. Your LDL is above normal. The LDL is the bad cholesterol. Over time and in combination with inflammation and other factors, this contributes to plaque which in turn may lead to stroke and/or heart attack down the road. Sometimes high LDL is primarily genetic, and people might be eating all the right foods but still have high numbers. Other times, there is room for improvement in one's diet and eating healthier can bring this number down and potentially reduce one's risk of heart attack and/or stroke.   To reduce your LDL, Remember - more fruits and vegetables, more fish, and limit red meat and dairy products. More soy, nuts, beans, barley, lentils, oats and plant sterol ester enriched margarine instead of butter. I also encourage eliminating sugar and processed food. Remember, shop on the outside of the grocery store and visit your [employer]. Any questions? Keep being amazing!!  Thank you for allowing me to participate in your care.  I appreciate you. Kindest regards, Shaterica Mcclatchy

## 2022-08-30 ENCOUNTER — Encounter: Payer: Self-pay | Admitting: Nurse Practitioner

## 2022-08-30 ENCOUNTER — Telehealth: Payer: Self-pay | Admitting: Nurse Practitioner

## 2022-08-30 NOTE — Telephone Encounter (Signed)
Copied from CRM [id number]. Topic: General - Other >> Aug 30, 2022 10:00 AM Dondra Prader E wrote: Missing a copy of the patient's sleep study   Fax: [phone] [facility] from [employer]

## 2022-09-04 ENCOUNTER — Encounter: Payer: Self-pay | Admitting: *Deleted

## 2022-10-21 IMAGING — MR MR ORBITS WO/W CM
9 of 12 series · 35 of 48 positions shown · IV contrast (gadavist)
Comparison: Prior MRI from 12/11/2020.

CLINICAL DATA: Initial evaluation for ophthalmoplegia.

EXAM:
MRI OF THE ORBITS WITHOUT AND WITH CONTRAST
TECHNIQUE: Multiplanar, multi-echo pulse sequences of the orbits and
surrounding structures were acquired including fat saturation
techniques, before and after intravenous contrast administration.
CONTRAST:  10mL GADAVIST GADOBUTROL 1 MMOL/ML IV SOLN

[Series 5: T1 · sagittal · 5.0mm · 0.78mm/px · 4 of 31 slices shown (1 of 2)]
[im 1/31]
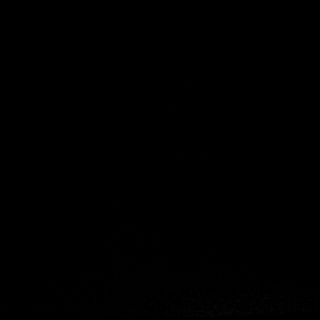
[im 11/31]
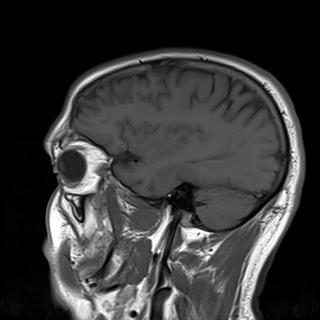
[im 21/31]
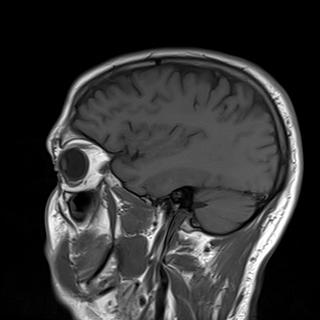
[im 31/31]
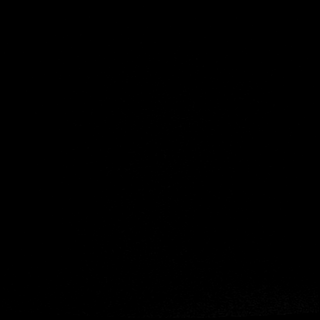

[Series 6: T1 · axial · non-contrast · 3.0mm · 0.39mm/px · z∈[-62,+5]mm · 3 of 25 slices shown (2 of 2)]
[im 1/25]
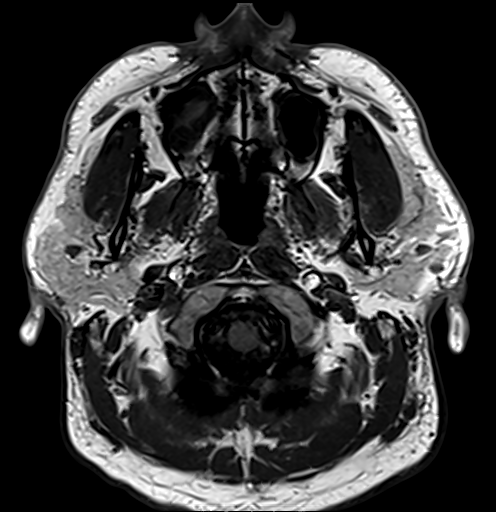
[im 9/25]
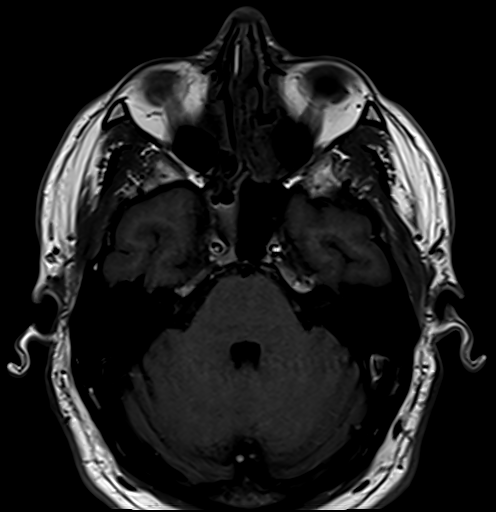
[im 17/25]
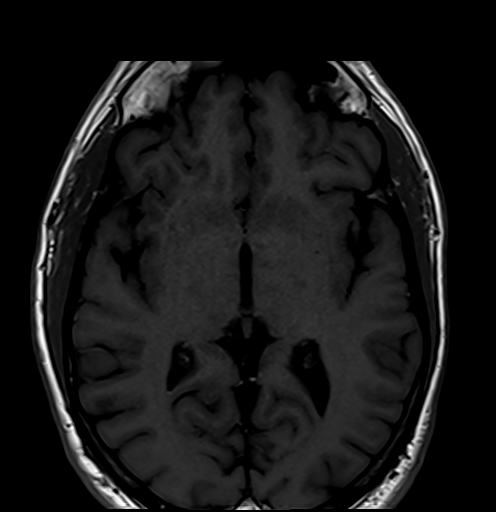

[Series 7: T2 fat-sat · axial · 3.0mm · 0.62mm/px · z∈[-62,+39]mm · 4 of 25 slices shown (1 of 6)]
[im 1/25]
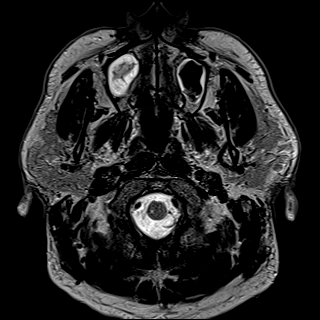
[im 9/25]
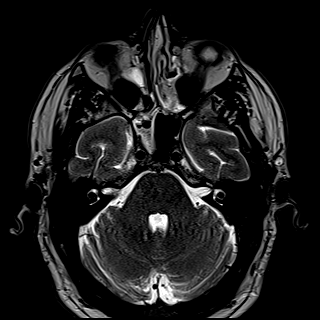
[im 17/25]
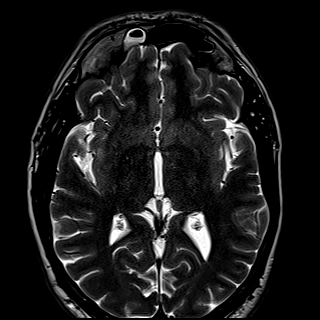
[im 25/25]
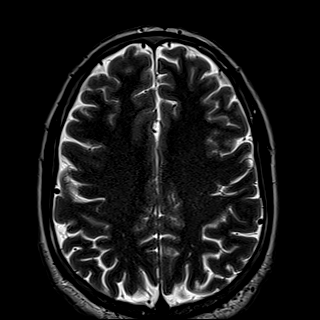

[Series 8: T2 fat-sat · axial · 3.0mm · 0.62mm/px · z∈[-62,+39]mm · 4 of 25 slices shown (2 of 6)]
[im 1/25]
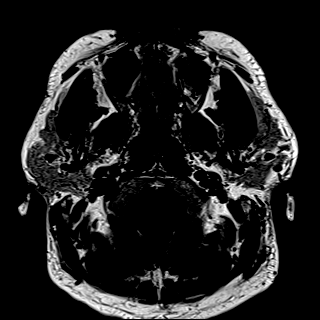
[im 9/25]
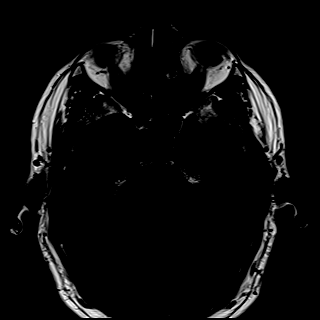
[im 17/25]
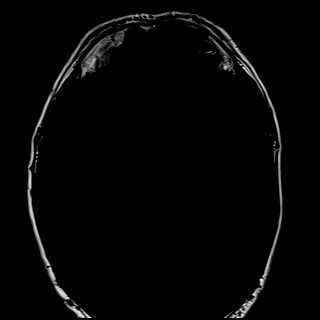
[im 25/25]
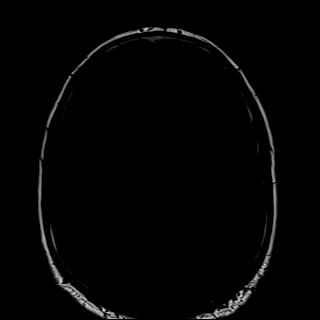

[Series 9: T2 fat-sat · axial · 3.0mm · 0.62mm/px · z∈[-62,+39]mm · 4 of 25 slices shown (3 of 6)]
[im 1/25]
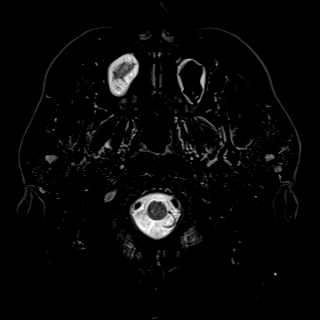
[im 9/25]
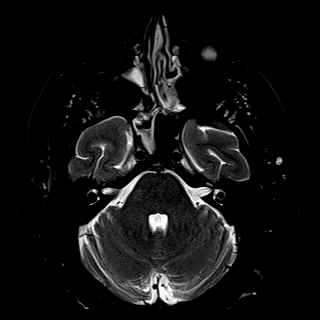
[im 17/25]
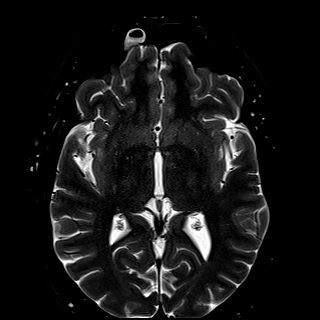
[im 25/25]
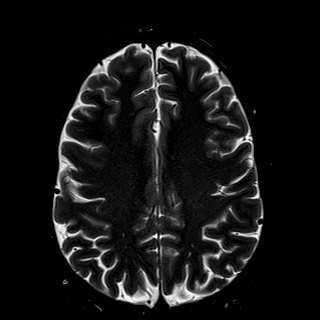

[Series 10: T2 fat-sat · coronal · 3.0mm · 0.59mm/px · 4 of 30 slices shown (4 of 6)]
[im 1/30]
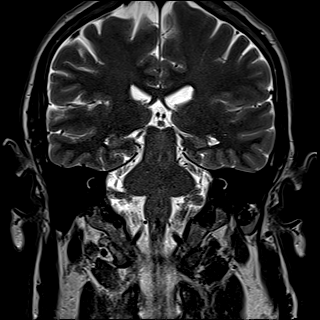
[im 10/30]
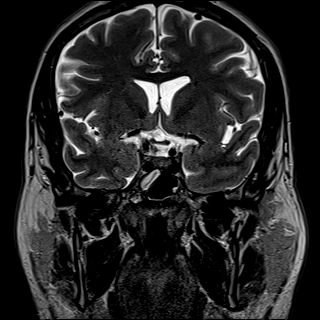
[im 20/30]
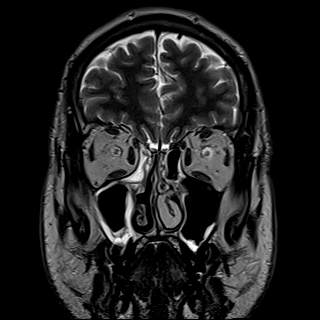
[im 30/30]
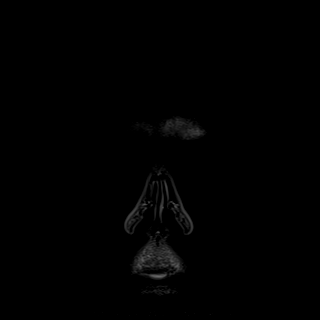

[Series 11: T2 fat-sat · coronal · 3.0mm · 0.59mm/px · 4 of 30 slices shown (5 of 6)]
[im 1/30]
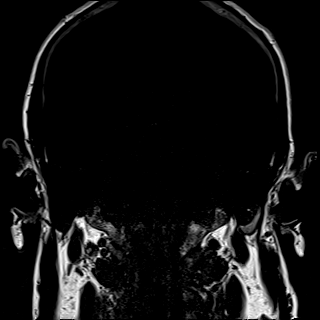
[im 10/30]
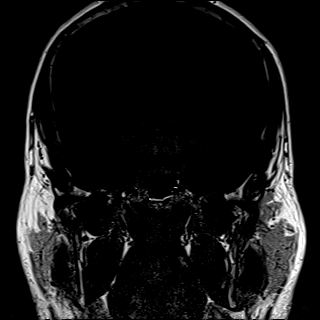
[im 20/30]
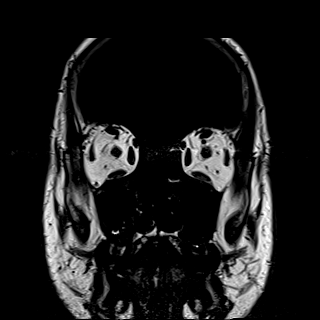
[im 30/30]
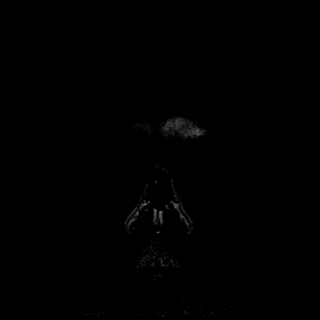

[Series 12: T2 fat-sat · coronal · 3.0mm · 0.59mm/px · 4 of 30 slices shown (6 of 6)]
[im 1/30]
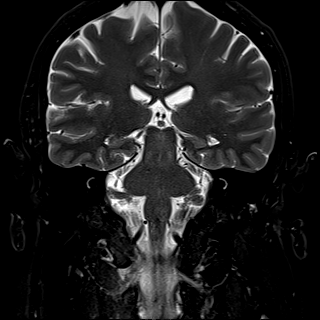
[im 10/30]
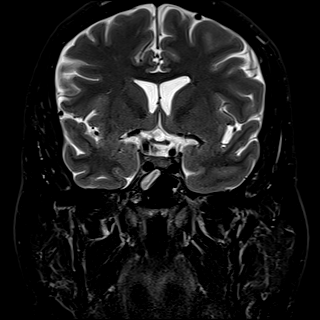
[im 20/30]
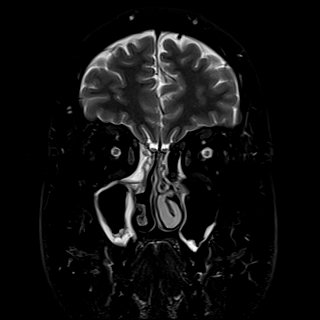
[im 30/30]
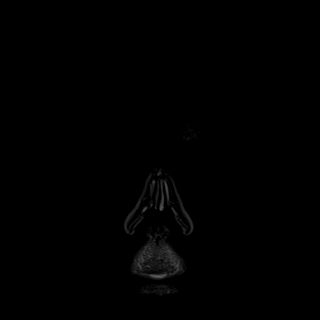

[Series 16: T1 post-contrast · axial · 5.0mm · 0.42mm/px · z∈[-63,+109]mm · 4 of 30 slices shown]
[im 1/30]
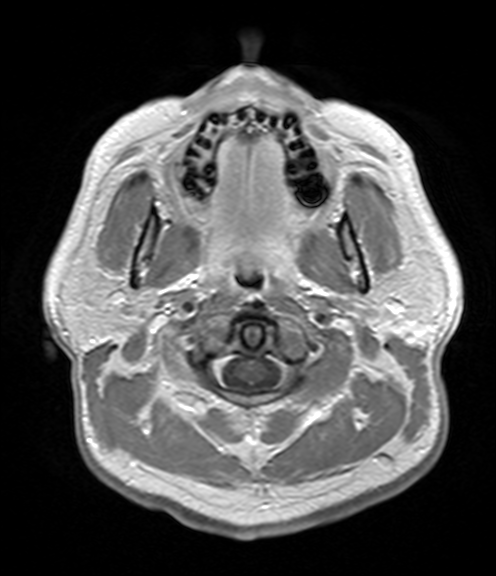
[im 10/30]
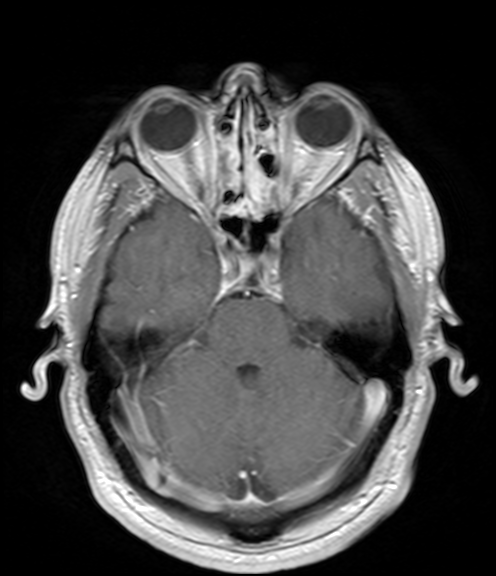
[im 20/30]
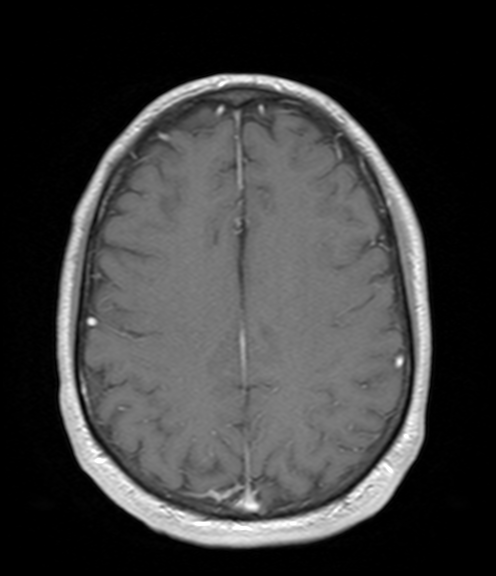
[im 30/30]
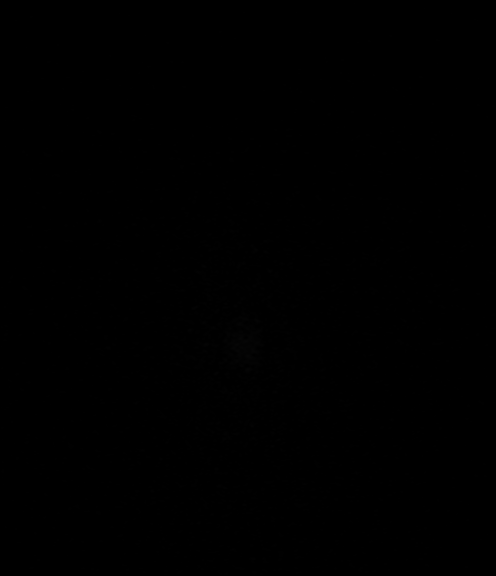

[35 of 48 positions shown; findings below may reference images not displayed]

FINDINGS: Orbits: Globes are symmetric in size with normal morphology and
appearance bilaterally. Optic nerves symmetric and normal. No
intrinsic optic nerve edema or enhancement to suggest optic
neuritis. No abnormality seen about either optic nerve sheath. No
abnormality about the orbital apices or cavernous sinus. Optic
chiasm normally situated within the suprasellar cistern without
abnormality. No sellar or suprasellar mass. Visualized optic
radiations normal.

Extra-ocular muscles symmetric and within normal limits. Intraconal
and extraconal fat well-maintained without evidence for intraorbital
or postseptal cellulitis. Lacrimal glands within normal limits.
Superior orbital veins symmetric and normal.

Visualized sinuses: Sequelae of prior sinus surgery noted. Extensive
mucosal thickening seen throughout the right frontal sinus,
ethmoidal air cells, right greater than left sphenoid sinuses, and
right greater than left maxillary sinuses. No visible evidence for
extension of infection across either lamina papyracea. No evidence
for intracranial spread of infection.

Soft tissues: Preseptal and periorbital soft tissues within normal
limits.

Limited intracranial: Unremarkable, with no new intracranial
abnormality visible.
IMPRESSION: 1. Normal MRI of the orbits.
2. Pan sinusitis, overall slightly worse on the right. No MRI
evidence for intraorbital or intracranial spread of infection.

## 2022-10-22 IMAGING — MR MR HEAD W/O CM
4 series · 19 of 48 positions shown · non-contrast
Comparison: MRI of the orbits 12/13/2020. MRI head and MRV head
without and with contrast 12/11/2020. Head CT 12/10/2020.

CLINICAL DATA: Headache, chronic, no new features

EXAM:
MRI HEAD WITHOUT CONTRAST
TECHNIQUE: Multiplanar, multiecho pulse sequences of the brain and surrounding
structures were obtained without intravenous contrast.

[Series 3: bSSFP · coronal · 0.7mm · 0.29mm/px · 10 of 144 slices shown (1 of 4)]
[im 9/144]
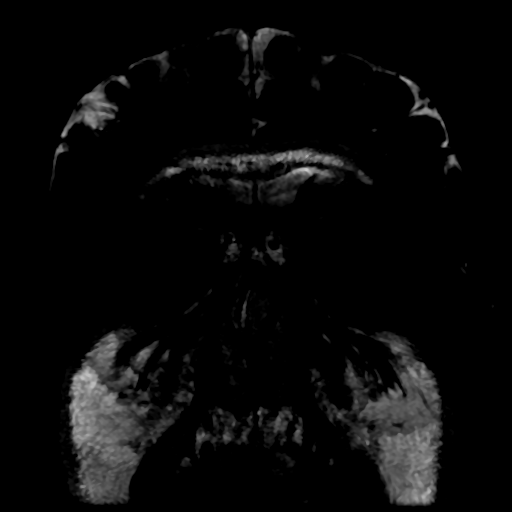
[im 26/144]
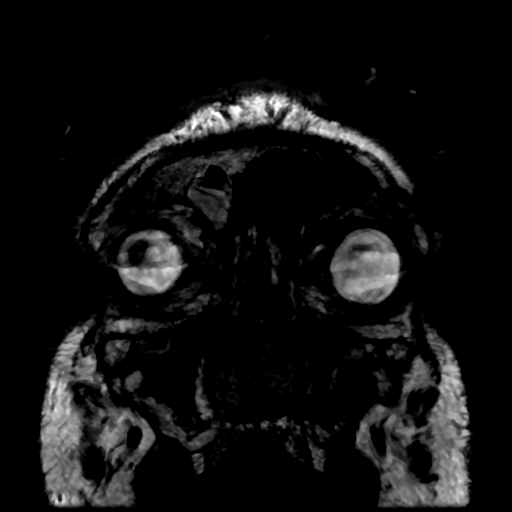
[im 43/144]
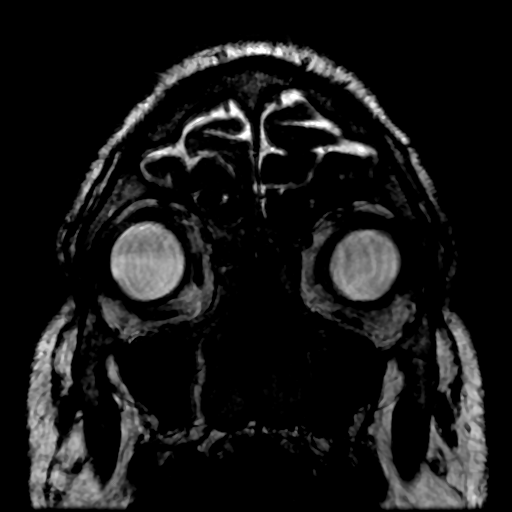
[im 59/144]
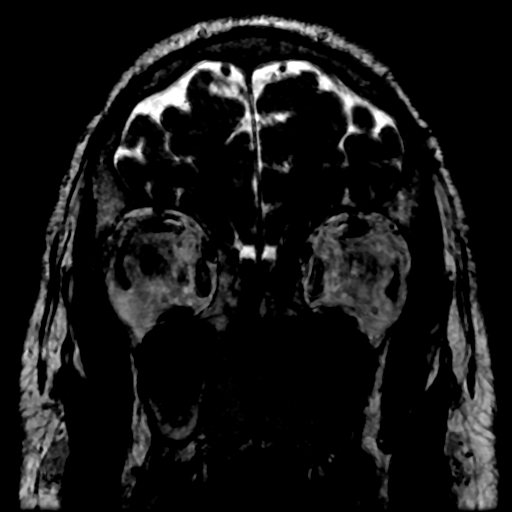
[im 76/144]
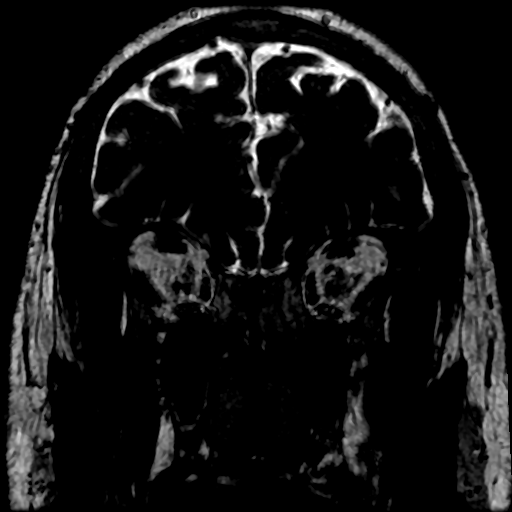
[im 85/144]
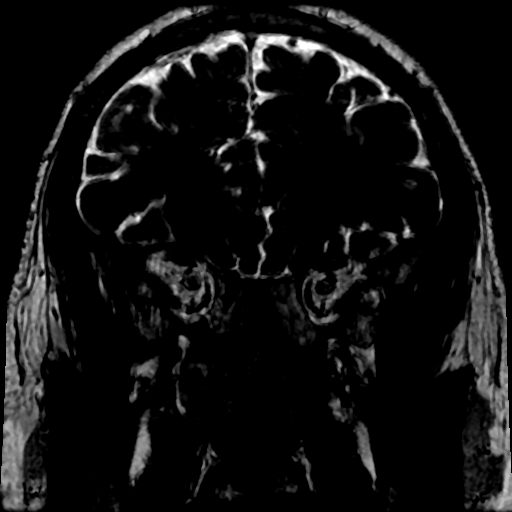
[im 101/144]
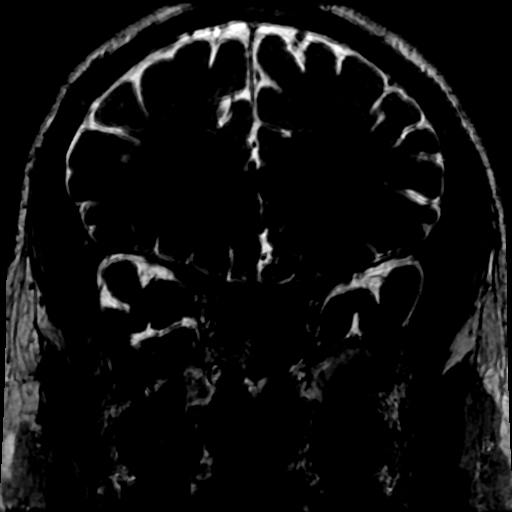
[im 118/144]
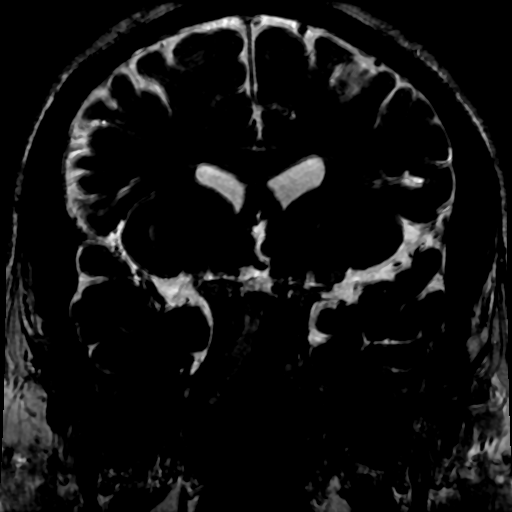
[im 127/144]
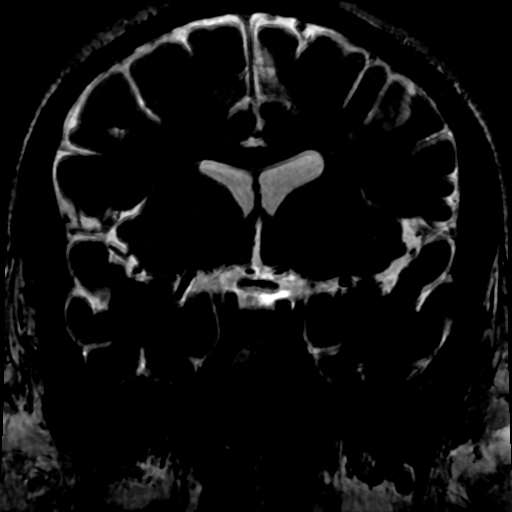
[im 135/144]
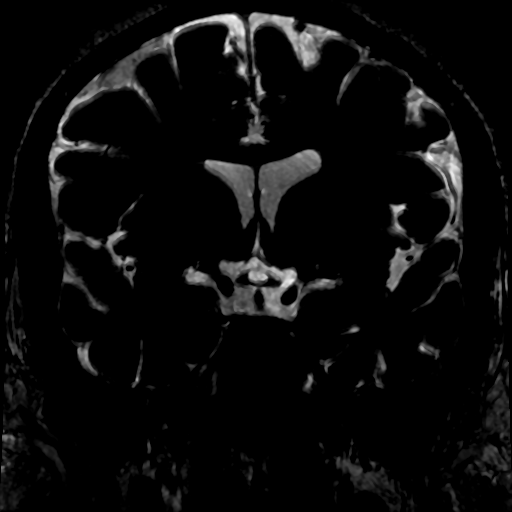

[Series 4: bSSFP · coronal · 1.2mm · 0.29mm/px · 3 of 92 slices shown (2 of 4)]
[im 10/92]
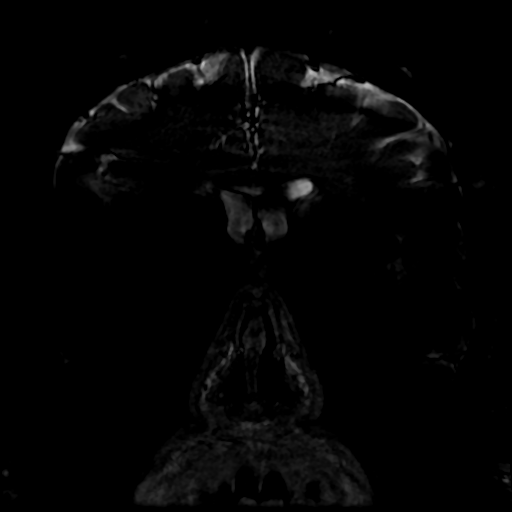
[im 46/92]
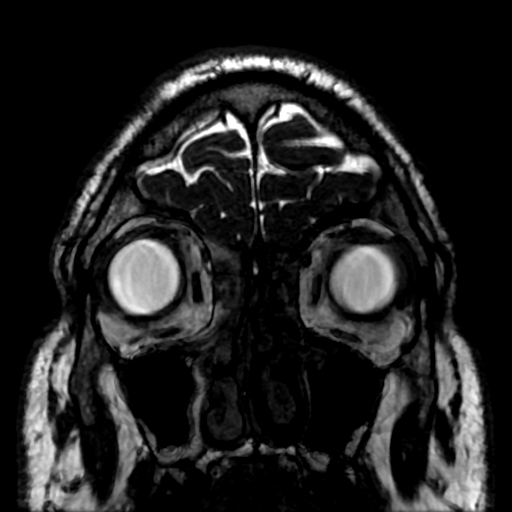
[im 82/92]
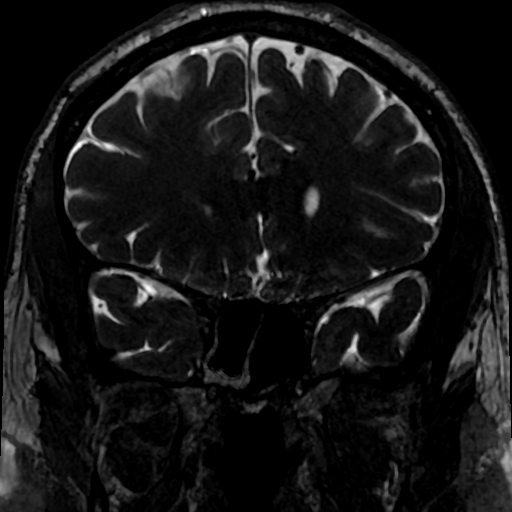

[Series 5: bSSFP · axial · 0.8mm · 0.29mm/px · z∈[-25,+2]mm · 3 of 64 slices shown (3 of 4)]
[im 10/64]
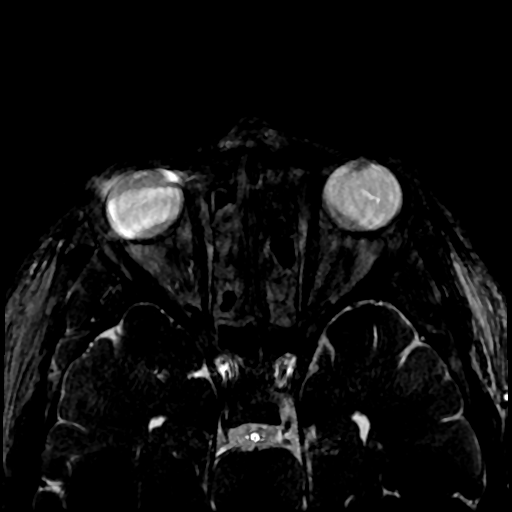
[im 37/64]
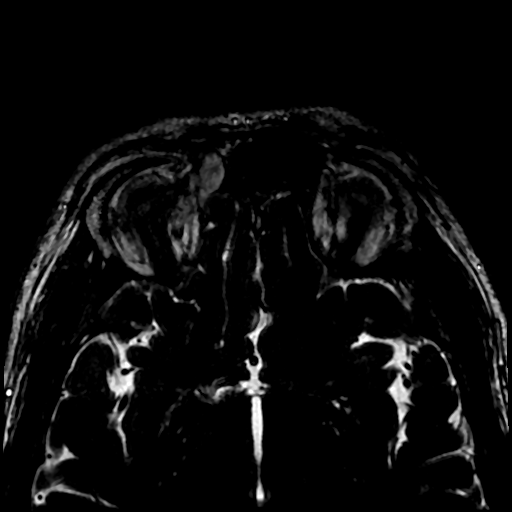
[im 55/64]
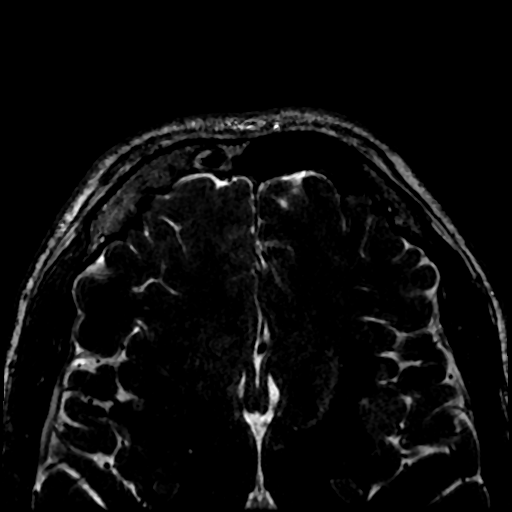

[Series 6: bSSFP · coronal · 1.2mm · 0.29mm/px · 3 of 92 slices shown (4 of 4)]
[im 10/92]
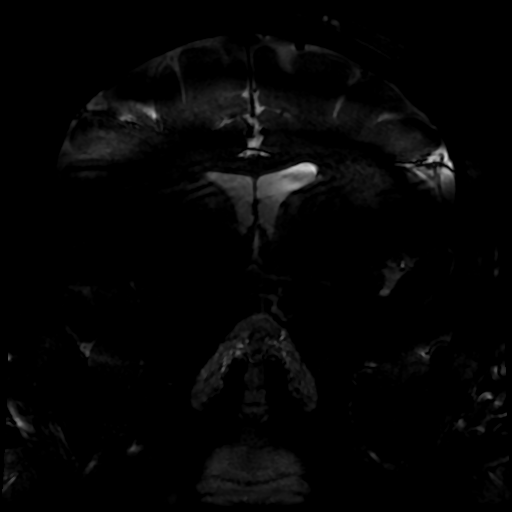
[im 46/92]
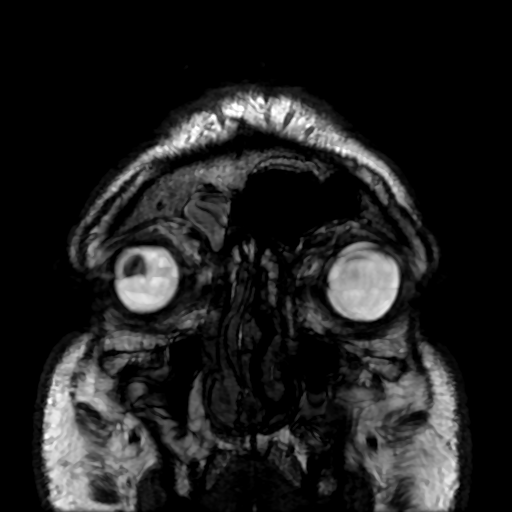
[im 82/92]
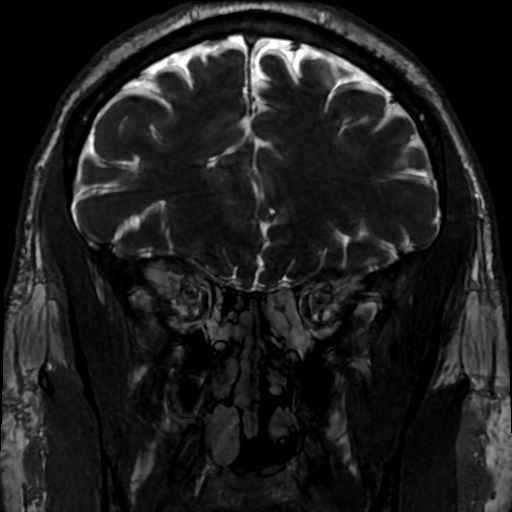

[19 of 48 positions shown; findings below may reference images not displayed]

FINDINGS: Mildly motion degraded exam.

At the ordering provider's request, heavily T2-weighted axial and
coronal FIESTA sequences were acquired through the face and anterior
cranial fossa to assess for foci of osseous dehiscence. In
correlating with the recent prior MRI of the orbits 12/13/2020, MRI
head and MRV head 12/11/2020 and head CT 12/10/2020, there is
osseous thinning of the right cribriform plate/lateral lamella
without convincing complete dehiscence.

Persistent extensive partial opacification of the right frontal
sinus secondary to the presence of mucosal thickening and fluid.
Persistent partial T2 hyperintense opacification of the bilateral
ethmoid air cells. Mild mucosal thickening within the bilateral
sphenoid sinuses.
IMPRESSION: Mildly motion degraded exam.

Heavily T2-weighted FIESTA sequences acquired through the face and
anterior cranial fossa to assess for foci of osseous dehiscence.

In correlating with the recent prior MRI of the orbits 12/13/2020,
MRI/MRV head 12/11/2020 and head CT 12/10/2020, there is osseous
thinning of the right cribriform plate/lateral lamella without
convincing complete dehiscence.

Persistent paranasal sinus disease, as described.

## 2023-01-05 DIAGNOSIS — J3489 Other specified disorders of nose and nasal sinuses: Secondary | ICD-10-CM | POA: Diagnosis not present

## 2023-01-05 DIAGNOSIS — Z6832 Body mass index (BMI) 32.0-32.9, adult: Secondary | ICD-10-CM | POA: Diagnosis not present

## 2023-01-05 DIAGNOSIS — Z789 Other specified health status: Secondary | ICD-10-CM | POA: Diagnosis not present

## 2023-01-10 ENCOUNTER — Ambulatory Visit: Payer: BC Managed Care – PPO | Admitting: Nurse Practitioner

## 2023-01-10 ENCOUNTER — Encounter: Payer: Self-pay | Admitting: Nurse Practitioner

## 2023-01-10 VITALS — BP 109/73 | HR 71 | Temp 98.3°F | Ht 70.0 in | Wt 228.8 lb

## 2023-01-10 DIAGNOSIS — H9312 Tinnitus, left ear: Secondary | ICD-10-CM | POA: Insufficient documentation

## 2023-01-10 MED ORDER — PREDNISONE 20 MG PO TABS: 40.0000 mg | ORAL_TABLET | Freq: Every day | ORAL | 0 refills | Status: AC

## 2023-01-10 NOTE — Progress Notes (Signed)
BP 109/73 (BP Location: Left Arm, Patient Position: Sitting, Cuff Size: Large)   Pulse 71   Temp 98.3 F (36.8 C) (Oral)   Ht 5\' 10"  (1.778 m)   Wt 228 lb 12.8 oz (103.8 kg)   SpO2 96%   BMI 32.83 kg/m    Subjective:    Patient ID: Jon Saunders, male    DOB: 01/04/1976, 47 y.o.   MRN: [id number]  HPI: Jon Saunders is a 47 y.o. male  Chief Complaint  Patient presents with   Loud clicking noise in ear    Left ear, Has been a problem for about 1 week, [facility] flon[facility] to 2x daily vs 1x daily, ineffective, has not become worse but has not become better, high frequency noise, constant throughout the day changes with the frequency of noise    EAR ISSUES Issues with left ear for one week. [facility] on 01/05/23 and was told to increase Flonase to twice a day which has not helped.   When start he noticed he was hearing this loud pitched ticking noise with certain frequencies it gets louder.  Sounds like fingernails clicking together.  It is loud enough it wakes him up.  Has not seen ENT in long period, has history of sinus surgery 3 years ago. Duration: days Description of tinnitus: clicking -- like a geiger counter Pulsatile: yes Tinnitus duration: continuous -- sometimes goes a couple minutes or an hour without it Episode frequency: continous Severity: moderate to severe Aggravating factors: frequencies, like running water or pulling aluminum foil out -- makes louder Alleviating factors: nothing -- has tried Q tips and ear plugs Head injury: no Chronic exposure to loud noises: no Exposure to ototoxic medications: no Vertigo:no Hearing loss: no Aural fullness: no Headache:no  TMJ syndrome symptoms: no Unsteady gait: no Postural instability: no Diplopia, dysarthria, dysphagia or weakness: no Anxiety/depression: no -- tinnitus make him more anxious   Relevant past medical, surgical, family and social history reviewed and updated as  indicated. Interim medical history since our last visit reviewed. Allergies and medications reviewed and updated.  Review of Systems  Constitutional:  Negative for activity change, appetite change, diaphoresis, fatigue and fever.  HENT:  Positive for tinnitus. Negative for congestion, ear discharge, ear pain, postnasal drip, rhinorrhea, sinus pressure and sinus pain.   Respiratory:  Negative for cough, chest tightness, shortness of breath and wheezing.   Cardiovascular:  Negative for chest pain, palpitations and leg swelling.  Gastrointestinal: Negative.   Neurological:  Negative for dizziness, seizures, syncope, weakness and numbness.  Psychiatric/Behavioral: Negative.      Per HPI unless specifically indicated above     Objective:    BP 109/73 (BP Location: Left Arm, Patient Position: Sitting, Cuff Size: Large)   Pulse 71   Temp 98.3 F (36.8 C) (Oral)   Ht 5\' 10"  (1.778 m)   Wt 228 lb 12.8 oz (103.8 kg)   SpO2 96%   BMI 32.83 kg/m   Wt Readings from Last 3 Encounters:  01/10/23 228 lb 12.8 oz (103.8 kg)  08/26/22 236 lb 3.2 oz (107.1 kg)  10/01/21 224 lb 3.2 oz (101.7 kg)    Physical Exam Vitals and nursing note reviewed.  Constitutional:      General: He is awake. He is not in acute distress.    Appearance: Normal appearance. He is well-developed and well-groomed. He is obese. He is not ill-appearing or toxic-appearing.  HENT:  Head: Normocephalic.     Right Ear: Hearing and external ear normal. A middle ear effusion is present. There is no impacted cerumen. Tympanic membrane is not injected or bulging.     Left Ear: Hearing and external ear normal. A middle ear effusion is present. There is no impacted cerumen. Tympanic membrane is not injected or bulging.     Ears:     Comments: Very mild erythema in both canals    Nose: No rhinorrhea.     Right Sinus: No maxillary sinus tenderness or frontal sinus tenderness.     Left Sinus: No maxillary sinus tenderness or  frontal sinus tenderness.     Mouth/Throat:     Mouth: Mucous membranes are moist.  Eyes:     General: Lids are normal.     Extraocular Movements: Extraocular movements intact.     Conjunctiva/sclera: Conjunctivae normal.  Neck:     Thyroid: No thyromegaly.     Vascular: No carotid bruit.  Cardiovascular:     Rate and Rhythm: Normal rate and regular rhythm.     Heart sounds: Normal heart sounds. No murmur heard.    No gallop.  Pulmonary:     Effort: No accessory muscle usage or respiratory distress.     Breath sounds: Normal breath sounds.  Abdominal:     General: Bowel sounds are normal. There is no distension.     Palpations: Abdomen is soft.     Tenderness: There is no abdominal tenderness.  Musculoskeletal:     Cervical back: Full passive range of motion without pain.     Right lower leg: No edema.     Left lower leg: No edema.  Lymphadenopathy:     Cervical: No cervical adenopathy.  Skin:    General: Skin is warm.     Capillary Refill: Capillary refill takes less than 2 seconds.  Neurological:     Mental Status: He is alert and oriented to person, place, and time.     Deep Tendon Reflexes: Reflexes are normal and symmetric.     Reflex Scores:      Brachioradialis reflexes are 2+ on the right side and 2+ on the left side.      Patellar reflexes are 2+ on the right side and 2+ on the left side. Psychiatric:        Attention and Perception: Attention normal.        Mood and Affect: Mood normal.        Speech: Speech normal.        Behavior: Behavior normal. Behavior is cooperative.        Thought Content: Thought content normal.    Results for orders placed or performed in visit on 08/26/22  CBC with Differential/Platelet  Result Value Ref Range   WBC 9.4 3.4 - 10.8 x10E3/uL   RBC 4.88 4.14 - 5.80 x10E6/uL   Hemoglobin 15.2 13.0 - 17.7 g/dL   Hematocrit 82.9 56.2 - 51.0 %   MCV 90 79 - 97 fL   MCH 31.1 26.6 - 33.0 pg   MCHC 34.7 31.5 - 35.7 g/dL   RDW 13.0 86.5  - 78.4 %   Platelets 215 150 - 450 x10E3/uL   Neutrophils 63 Not Estab. %   Lymphs 26 Not Estab. %   Monocytes 7 Not Estab. %   Eos 4 Not Estab. %   Basos 0 Not Estab. %   Neutrophils Absolute 5.9 1.4 - 7.0 x10E3/uL   Lymphocytes Absolute 2.4 0.7 -  3.1 x10E3/uL   Monocytes Absolute 0.7 0.1 - 0.9 x10E3/uL   EOS (ABSOLUTE) 0.4 0.0 - 0.4 x10E3/uL   Basophils Absolute 0.0 0.0 - 0.2 x10E3/uL   Immature Granulocytes 0 Not Estab. %   Immature Grans (Abs) 0.0 0.0 - 0.1 x10E3/uL  Comprehensive metabolic panel  Result Value Ref Range   Glucose 88 70 - 99 mg/dL   BUN 13 6 - 24 mg/dL   Creatinine, Ser 1.30 0.76 - 1.27 mg/dL   eGFR 88 >86 VH/QIO/9.62   BUN/Creatinine Ratio 12 9 - 20   Sodium 139 134 - 144 mmol/L   Potassium 3.9 3.5 - 5.2 mmol/L   Chloride 103 96 - 106 mmol/L   CO2 24 20 - 29 mmol/L   Calcium 9.5 8.7 - 10.2 mg/dL   Total Protein 7.0 6.0 - 8.5 g/dL   Albumin 4.4 4.1 - 5.1 g/dL   Globulin, Total 2.6 1.5 - 4.5 g/dL   Bilirubin Total 0.3 0.0 - 1.2 mg/dL   Alkaline Phosphatase 81 44 - 121 IU/L   AST 20 0 - 40 IU/L   ALT 20 0 - 44 IU/L  Lipid Panel w/o Chol/HDL Ratio  Result Value Ref Range   Cholesterol, Total 190 100 - 199 mg/dL   Triglycerides 952 (H) 0 - 149 mg/dL   HDL 58 >84 mg/dL   VLDL Cholesterol Cal 32 5 - 40 mg/dL   LDL Chol Calc (NIH) 132 (H) 0 - 99 mg/dL  TSH  Result Value Ref Range   TSH 2.200 0.450 - 4.500 uIU/mL      Assessment & Plan:   Problem List Items Addressed This Visit       Other   Tinnitus of left ear - Primary    Ongoing for one week, some fluid behind TM bilaterally.  Will trial Prednisone 40 MG daily for 5 days.  Recommend he continue Flonase as ordered.  No recent illnesses.  Have recommended he avoid alcohol and coffee, which can worsen tinnitus. Try to avoid loud environments which may make symptoms worse.  Will place referral to [facility], Dr. Juleen China, whom he has seen in past.  Would benefit further ENT assessment.      Relevant Orders    Ambulatory referral to ENT     Follow up plan: Return if symptoms worsen or fail to improve.

## 2023-01-10 NOTE — Patient Instructions (Signed)
Tinnitus Tinnitus is when you hear a sound that there's no actual source for. It may sound like ringing in your ears or something else. The sound may be loud, soft, or somewhere in between. It can last for a few seconds or be constant for days. It can come and go. Almost everyone has tinnitus at some point. It's not the same as hearing loss. But you may need to see a health care provider if: It lasts for a long time. It comes back often. You have trouble sleeping and focusing. What are the causes? The cause of tinnitus is often unknown. In some cases, you may get it if: You're around loud noises, such as from machines or music. An object gets stuck in your ear. Earwax builds up in Landscape [employer]. You drink a lot of alcohol or caffeine. You take certain medicines. You start to lose your hearing. You may also get it from some medical conditions. These may include: Ear or sinus infections. Heart diseases. High blood pressure. Allergies. Mnire's disease. Problems with your thyroid. A tumor. This is a growth of cells that isn't normal. A weak, bulging blood vessel called an aneurysm near your ear. What increases the risk? You may be more likely to get tinnitus if: You're around loud noises a lot. You're older. You drink alcohol. You smoke. What are the signs or symptoms? The main symptom is hearing a sound that there's no source for. It may sound like ringing. It may also sound like: Buzzing. Sizzling. Blowing air. Hissing. Whistling. Other sounds may include: Roaring. Running water. A musical note. Tapping. Humming. You may have symptoms in one ear or both ears. How is this diagnosed? Tinnitus is diagnosed based on your symptoms, your medical history, and an exam. Your provider may do a full hearing test if your tinnitus: Is in just one ear. Makes it hard for you to hear. Lasts 6 months or longer. You may also need to see an expert in hearing disorders called an audiologist.  They may ask you about your symptoms and how tinnitus affects your daily life. You may have other tests done. These may include: A CT scan. An MRI. An angiogram. This shows how blood flows through your blood vessels. How is this treated? Treatment may include: Therapy to help you manage the stress of living with tinnitus. Finding ways to mask or cover the sound of tinnitus. These include: Sound or white noise machines. Devices that fit in your ear and play sounds or music. A loud humidifier. Acoustic neural stimulation. This is when you use headphones to listen to music that has a special signal in it. Over time, this signal may change some of the pathways in your brain. This can make you less sensitive to tinnitus. This treatment is used for very severe cases. Using hearing aids or cochlear implants if your tinnitus is from hearing loss. If your tinnitus is caused by a medical condition, treating the condition may make it go away.  Follow these instructions at home: Managing symptoms     Try to avoid being in loud places or around loud noises. Wear earplugs or headphones when you're around loud noises. Find ways to reduce stress. These may include meditation, yoga, or deep breathing. Sleep with your head slightly raised. General instructions Take over-the-counter and prescription medicines only as told by your provider. Track the things that cause symptoms (triggers). Try to avoid these things. To stop your tinnitus from getting worse: Do not drink alcohol. Do  not have caffeine. Do not use any products that contain nicotine or tobacco. These products include cigarettes, chewing tobacco, and vaping devices, such as e-cigarettes. If you need help quitting, ask your provider. Avoid using too much salt. Get enough sleep each night. Where to find more information American Tinnitus Association: https://www.johnson-hamilton.org/ Contact a health care provider if: Your symptoms last for 3 weeks or longer without  stopping. You have sudden hearing loss. Your symptoms get worse or don't get better with home care. You can't manage the stress of living with tinnitus. Get help right away if: You get tinnitus after a head injury. You have tinnitus and: Dizziness. Nausea and vomiting. Loss of balance. A sudden, severe headache. Changes to your eyesight. Weakness in your face, arms, or legs. These symptoms may be an emergency. Get help right away. Call 911. Do not wait to see if the symptoms will go away. Do not drive yourself to the hospital. This information is not intended to replace advice given to you by your health care provider. Make sure you discuss any questions you have with your health care provider. Document Revised: 06/03/2022 Document Reviewed: 06/03/2022 Elsevier Patient Education  2024 [employer].

## 2023-01-10 NOTE — Assessment & Plan Note (Signed)
Ongoing for one week, some fluid behind TM bilaterally.  Will trial Prednisone 40 MG daily for 5 days.  Recommend he continue Flonase as ordered.  No recent illnesses.  Have recommended he avoid alcohol and coffee, which can worsen tinnitus. Try to avoid loud environments which may make symptoms worse.  Will place referral to [facility], Dr. Juleen China, whom he has seen in past.  Would benefit further ENT assessment.

## 2023-01-13 ENCOUNTER — Ambulatory Visit: Payer: BC Managed Care – PPO | Admitting: Nurse Practitioner

## 2023-01-14 ENCOUNTER — Encounter: Payer: Self-pay | Admitting: Nurse Practitioner

## 2023-01-21 DIAGNOSIS — H93A9 Pulsatile tinnitus, unspecified ear: Secondary | ICD-10-CM | POA: Diagnosis not present

## 2023-01-21 DIAGNOSIS — H9319 Tinnitus, unspecified ear: Secondary | ICD-10-CM | POA: Diagnosis not present

## 2023-01-21 DIAGNOSIS — H9312 Tinnitus, left ear: Secondary | ICD-10-CM | POA: Diagnosis not present

## 2023-03-16 DIAGNOSIS — Z681 Body mass index (BMI) 19 or less, adult: Secondary | ICD-10-CM | POA: Diagnosis not present

## 2023-03-16 DIAGNOSIS — R051 Acute cough: Secondary | ICD-10-CM | POA: Diagnosis not present

## 2023-03-16 DIAGNOSIS — J0110 Acute frontal sinusitis, unspecified: Secondary | ICD-10-CM | POA: Diagnosis not present

## 2023-07-11 DIAGNOSIS — M7541 Impingement syndrome of right shoulder: Secondary | ICD-10-CM | POA: Diagnosis not present

## 2023-08-07 DIAGNOSIS — M7541 Impingement syndrome of right shoulder: Secondary | ICD-10-CM | POA: Diagnosis not present

## 2023-10-29 ENCOUNTER — Ambulatory Visit
Admission: RE | Admit: 2023-10-29 | Discharge: 2023-10-29 | Disposition: A | Discharge status: Home / Self Care | Source: Ambulatory Visit | Attending: Emergency Medicine | Admitting: Emergency Medicine

## 2023-10-29 VITALS — BP 118/82 | HR 76 | Temp 98.5°F | Resp 20

## 2023-10-29 DIAGNOSIS — J0110 Acute frontal sinusitis, unspecified: Secondary | ICD-10-CM | POA: Diagnosis not present

## 2023-10-29 MED ORDER — AZITHROMYCIN 250 MG PO TABS: 250.0000 mg | ORAL_TABLET | Freq: Every day | ORAL | 0 refills | Status: DC

## 2023-10-29 NOTE — ED Triage Notes (Addendum)
 Patient states I think I have sinus infection. Patient complains of sinus pressure with nasal drainage with greenish and yellowish drainage. Patient states that he had chills early this morning. Patient reports he took Tylenol  1000 mg at 2 am with mild relief. Patient also Ibuprofen  600 mg at 8 am today. Rates pain 7/10.

## 2023-10-29 NOTE — Discharge Instructions (Signed)
 Today you are being treated for sinus infection, as your symptoms have been present for 4 days it is possible that they are being caused by a virus more so than a bacterial infection however due to your history of reoccurring sinus infections you will be started on antibiotics today  Take azithromycin  as directed  You may continue ibuprofen , nasal spray and nasal lavage in addition, may also attempt any of the following below    You can take Tylenol  and/or Ibuprofen  as needed for fever reduction and pain relief.   For cough: honey 1/2 to 1 teaspoon (you can dilute the honey in water or another fluid).  You can also use guaifenesin  and dextromethorphan  for cough. You can use a humidifier for chest congestion and cough.  If you don't have a humidifier, you can sit in the bathroom with the hot shower running.      For sore throat: try warm salt water gargles, cepacol lozenges, throat spray, warm tea or water with lemon/honey, popsicles or ice, or OTC cold relief medicine for throat discomfort.   For congestion: take a daily anti-histamine like Zyrtec, Claritin , and a oral decongestant, such as pseudoephedrine.  You can also use Flonase  1-2 sprays in each nostril daily.   It is important to stay hydrated: drink plenty of fluids (water, gatorade/powerade/pedialyte, juices, or teas) to keep your throat moisturized and help further relieve irritation/discomfort.

## 2023-10-29 NOTE — ED Provider Notes (Signed)
 Jon Saunders    CSN: [id number] Arrival date & time: 10/29/23  9156      History   Chief Complaint Chief Complaint  Patient presents with   Fever    I have a sinus infection. - Entered by patient   Nasal Congestion    HPI Jon Saunders is a 48 y.o. male.   Patient presents for evaluation of foul yellow to green-brown nasal drainage, sinus drainage, sinus pressure to the bridge of the nose and behind the eyes, intermittent headaches, and very mild nonproductive cough beginning 4 days ago.  Experiencing subjective fever and chills yesterday.  History of reoccurring sinusitis, was followed by ENT until surgical intervention.  Has attempted use of Mucinex , ibuprofen , nasal lavage.  Denies ear pain, sore throat, shortness of breath or wheezing.  Past Medical History:  Diagnosis Date   Allergy     Anxiety    Meningitis    [place] spotted fever     Patient Active Problem List   Diagnosis Date Noted   Tinnitus of left ear 01/10/2023   OSA on CPAP 08/25/2022   Elevated low density lipoprotein (LDL) cholesterol level 08/25/2022   Chronic pansinusitis 01/17/2021   History of meningitis 12/10/2020   History of [place] spotted fever 05/18/2019   Obesity 05/18/2019   Depression with anxiety 05/18/2019    Past Surgical History:  Procedure Laterality Date   WISDOM TOOTH EXTRACTION         Home Medications    Prior to Admission medications   Medication Sig Start Date End Date Taking? Authorizing Provider  azithromycin  (ZITHROMAX ) 250 MG tablet Take 1 tablet (250 mg total) by mouth daily. Take first 2 tablets together, then 1 every day until finished. 10/29/23  Yes Oyinkansola Truax R, NP  FLUoxetine  (PROZAC ) 40 MG capsule Take 1 capsule (40 mg total) by mouth daily. 08/26/22   Cannady, Jolene T, NP  fluticasone  (FLONASE ) 50 MCG/ACT nasal spray SPRAY 2 SPRAYS INTO EACH NOSTRIL EVERY DAY Patient taking differently: SPRAY 2 SPRAYS INTO EACH NOSTRIL 2x daily  08/26/22   Cannady, Jolene T, NP    Family History Family History  Problem Relation Age of Onset   Hypertension Mother    Epilepsy Mother    Seizures Mother    Migraines Father    Alzheimer's disease Father    Epilepsy Sister    Seizures Sister    Ovarian cancer Maternal Grandmother     Social History Social History   Tobacco Use   Smoking status: Never   Smokeless tobacco: Never  Vaping Use   Vaping status: Never Used  Substance Use Topics   Alcohol use: Yes    Alcohol/week: 4.0 standard drinks of alcohol    Types: 1 Glasses of wine, 3 Standard drinks or equivalent per week    Comment: every couple days    Drug use: Never     Allergies   Patient has no known allergies.   Review of Systems Review of Systems   Physical Exam Triage Vital Signs ED Triage Vitals  Encounter Vitals Group     BP 10/29/23 0849 118/82     Girls Systolic BP Percentile --      Girls Diastolic BP Percentile --      Boys Systolic BP Percentile --      Boys Diastolic BP Percentile --      Pulse Rate 10/29/23 0849 76     Resp 10/29/23 0849 20     Temp 10/29/23 0849 98.5  F (36.9 C)     Temp Source 10/29/23 0849 Oral     SpO2 10/29/23 0849 96 %     Weight --      Height --      Head Circumference --      Peak Flow --      Pain Score 10/29/23 0853 7     Pain Loc --      Pain Education --      Exclude from Growth Chart --    No data found.  Updated Vital Signs BP 118/82 (BP Location: Left Arm)   Pulse 76   Temp 98.5 F (36.9 C) (Oral)   Resp 20   SpO2 96%   Visual Acuity Right Eye Distance:   Left Eye Distance:   Bilateral Distance:    Right Eye Near:   Left Eye Near:    Bilateral Near:     Physical Exam Constitutional:      Appearance: Normal appearance.  HENT:     Right Ear: Tympanic membrane, ear canal and external ear normal.     Left Ear: Tympanic membrane, ear canal and external ear normal.     Nose: Congestion present.     Right Sinus: Frontal sinus  tenderness present.     Left Sinus: Frontal sinus tenderness present.     Mouth/Throat:     Pharynx: No oropharyngeal exudate or posterior oropharyngeal erythema.  Eyes:     Extraocular Movements: Extraocular movements intact.  Cardiovascular:     Rate and Rhythm: Normal rate and regular rhythm.     Pulses: Normal pulses.     Heart sounds: Normal heart sounds.  Pulmonary:     Effort: Pulmonary effort is normal.     Breath sounds: Normal breath sounds.  Musculoskeletal:     Cervical back: Normal range of motion and neck supple.  Neurological:     Mental Status: He is alert and oriented to person, place, and time. Mental status is at baseline.      UC Treatments / Results  Labs (all labs ordered are listed, but only abnormal results are displayed) Labs Reviewed - No data to display  EKG   Radiology No results found.  Procedures Procedures (including critical care time)  Medications Ordered in UC Medications - No data to display  Initial Impression / Assessment and Plan / UC Course  I have reviewed the triage vital signs and the nursing notes.  Pertinent labs & imaging results that were available during my care of the patient were reviewed by me and considered in my medical decision making (see chart for details).  Acute nonrecurrent frontal sinusitis  Vital signs are stable, patient is in no signs of distress nor toxic appearing, nasal congestion and sinus tenderness noted on exam consistent with a sinus infection, symptoms present for 4 days and etiology most likely viral, discussed this with patient, declined COVID testing, empirically placed on azithromycin  due to history of reoccurring bacterial infections, stable for outpatient management, declined prednisone  at this time as symptoms are manageable with over-the-counter analgesics and supportive care advised to continue and follow-up with urgent care or primary doctor if symptoms do not improve or worsen Final  Clinical Impressions(s) / UC Diagnoses   Final diagnoses:  Acute non-recurrent frontal sinusitis     Discharge Instructions      Today you are being treated for sinus infection, as your symptoms have been present for 4 days it is possible that they are being caused by  a virus more so than a bacterial infection however due to your history of reoccurring sinus infections you will be started on antibiotics today  Take azithromycin  as directed  You may continue ibuprofen , nasal spray and nasal lavage in addition, may also attempt any of the following below    You can take Tylenol  and/or Ibuprofen  as needed for fever reduction and pain relief.   For cough: honey 1/2 to 1 teaspoon (you can dilute the honey in water or another fluid).  You can also use guaifenesin  and dextromethorphan  for cough. You can use a humidifier for chest congestion and cough.  If you don't have a humidifier, you can sit in the bathroom with the hot shower running.      For sore throat: try warm salt water gargles, cepacol lozenges, throat spray, warm tea or water with lemon/honey, popsicles or ice, or OTC cold relief medicine for throat discomfort.   For congestion: take a daily anti-histamine like Zyrtec, Claritin , and a oral decongestant, such as pseudoephedrine.  You can also use Flonase  1-2 sprays in each nostril daily.   It is important to stay hydrated: drink plenty of fluids (water, gatorade/powerade/pedialyte, juices, or teas) to keep your throat moisturized and help further relieve irritation/discomfort.    ED Prescriptions     Medication Sig Dispense Auth. Provider   azithromycin  (ZITHROMAX ) 250 MG tablet Take 1 tablet (250 mg total) by mouth daily. Take first 2 tablets together, then 1 every day until finished. 6 tablet Dawaun Brancato R, NP      PDMP not reviewed this encounter.   Teresa Shelba SAUNDERS, [place] 10/29/23 [phone]

## 2023-11-06 ENCOUNTER — Other Ambulatory Visit: Payer: Self-pay | Admitting: Nurse Practitioner

## 2023-11-07 NOTE — Telephone Encounter (Signed)
 Patient is overdue for follow up visit. Please call and schedule and then route to provider for refill.

## 2023-11-07 NOTE — Telephone Encounter (Signed)
 Requested medications are due for refill today.  yes  Requested medications are on the active medications list.  yes  Last refill. 08/26/2022 #90 4 rf  Future visit scheduled.   no  Notes to clinic.  Pt is more than 3 months overdue for an OV.    Requested Prescriptions  Pending Prescriptions Disp Refills   FLUoxetine  (PROZAC ) 40 MG capsule [Pharmacy Med Name: FLUOXETINE  HCL 40 MG CAPSULE] 90 capsule 4    Sig: Take 1 capsule (40 mg total) by mouth daily.     Psychiatry:  Antidepressants - SSRI Failed - 11/07/2023  2:20 PM      Failed - Completed PHQ-2 or PHQ-9 in the last 360 days      Failed - Valid encounter within last 6 months    Recent Outpatient Visits   None

## 2023-11-11 NOTE — Telephone Encounter (Signed)
 Called patient and left a message for him to call back to get scheduled.

## 2023-11-12 ENCOUNTER — Encounter: Payer: Self-pay | Admitting: Nurse Practitioner

## 2023-11-13 NOTE — Patient Instructions (Signed)
 Be Involved in Caring For Your Health:  Taking Medications When medications are taken as directed, they can greatly improve your health. But if they are not taken as prescribed, they may not work. In some cases, not taking them correctly can be harmful. To help ensure your treatment remains effective and safe, understand your medications and how to take them. Bring your medications to each visit for review by your provider.  Your lab results, notes, and after visit summary will be available on My Chart. We strongly encourage you to use this feature. If lab results are abnormal the clinic will contact you with the appropriate steps. If the clinic does not contact you assume the results are satisfactory. You can always view your results on My Chart. If you have questions regarding your health or results, please contact the clinic during office hours. You can also ask questions on My Chart.  We at [facility] are grateful that you chose us  to provide your care. We strive to provide evidence-based and compassionate care and are always looking for feedback. If you get a survey from the clinic please complete this so we can hear your opinions.  Healthy Eating, Adult Healthy eating may help you get and keep a healthy body weight, reduce the risk of chronic disease, and live a long and productive life. It is important to follow a healthy eating pattern. Your nutritional and calorie needs should be met mainly by different nutrient-rich foods. What are tips for following this plan? Reading food labels Read labels and choose the following: Reduced or low sodium products. Juices with 100% fruit juice. Foods with low saturated fats (<3 g per serving) and high polyunsaturated and monounsaturated fats. Foods with whole grains, such as whole wheat, cracked wheat, brown rice, and wild rice. Whole grains that are fortified with folic acid. This is recommended for females who are pregnant or who want to  become pregnant. Read labels and do not eat or drink the following: Foods or drinks with added sugars. These include foods that contain brown sugar, corn sweetener, corn syrup, dextrose , fructose, glucose, high-fructose corn syrup, honey, invert sugar, lactose, malt syrup, maltose, molasses, raw sugar, sucrose, trehalose, or turbinado sugar. Limit your intake of added sugars to less than 10% of your total daily calories. Do not eat more than the following amounts of added sugar per day: 6 teaspoons (25 g) for females. 9 teaspoons (38 g) for males. Foods that contain processed or refined starches and grains. Refined grain products, such as white flour, degermed cornmeal, white bread, and white rice. Shopping Choose nutrient-rich snacks, such as vegetables, whole fruits, and nuts. Avoid high-calorie and high-sugar snacks, such as potato chips, fruit snacks, and candy. Use oil-based dressings and spreads on foods instead of solid fats such as butter, margarine, sour cream, or cream cheese. Limit pre-made sauces, mixes, and instant products such as flavored rice, instant noodles, and ready-made pasta. Try more plant-protein sources, such as tofu, tempeh, black beans, edamame, lentils, nuts, and seeds. Explore eating plans such as the Mediterranean diet or vegetarian diet. Try heart-healthy dips made with beans and healthy fats like hummus and guacamole. Vegetables go great with these. Cooking Use oil to saut or stir-fry foods instead of solid fats such as butter, margarine, or lard. Try baking, boiling, grilling, or broiling instead of frying. Remove the fatty part of meats before cooking. Steam vegetables in water  or broth. Meal planning  At meals, imagine dividing your plate into fourths: One-half of  your plate is fruits and vegetables. One-fourth of your plate is whole grains. One-fourth of your plate is protein, especially lean meats, poultry, eggs, tofu, beans, or nuts. Include low-fat  dairy as part of your daily diet. Lifestyle Choose healthy options in all settings, including home, work, school, restaurants, or stores. Prepare your food safely: Wash your hands after handling raw meats. Where you prepare food, keep surfaces clean by regularly washing with hot, soapy water . Keep raw meats separate from ready-to-eat foods, such as fruits and vegetables. Cook seafood, meat, poultry, and eggs to the recommended temperature. Get a food thermometer. Store foods at safe temperatures. In general: Keep cold foods at 84F (4.4C) or below. Keep hot foods at 184F (60C) or above. Keep your freezer at [facility] (-17.8C) or below. Foods are not safe to eat if they have been between the temperatures of 40-184F (4.4-60C) for more than 2 hours. What foods should I eat? Fruits Aim to eat 1-2 cups of fresh, canned (in natural juice), or frozen fruits each day. One cup of fruit equals 1 small apple, 1 large banana, 8 large strawberries, 1 cup (237 g) canned fruit,  cup (82 g) dried fruit, or 1 cup (240 mL) 100% juice. Vegetables Aim to eat 2-4 cups of fresh and frozen vegetables each day, including different varieties and colors. One cup of vegetables equals 1 cup (91 g) broccoli or cauliflower florets, 2 medium carrots, 2 cups (150 g) raw, leafy greens, 1 large tomato, 1 large bell pepper, 1 large sweet potato, or 1 medium white potato. Grains Aim to eat 5-10 ounce-equivalents of whole grains each day. Examples of 1 ounce-equivalent of grains include 1 slice of bread, 1 cup (40 g) ready-to-eat cereal, 3 cups (24 g) popcorn, or  cup (93 g) cooked rice. Meats and other proteins Try to eat 5-7 ounce-equivalents of protein each day. Examples of 1 ounce-equivalent of protein include 1 egg,  oz nuts (12 almonds, 24 pistachios, or 7 walnut halves), 1/4 cup (90 g) cooked beans, 6 tablespoons (90 g) hummus or 1 tablespoon (16 g) peanut butter. A cut of meat or fish that is the size of a deck of  cards is about 3-4 ounce-equivalents (85 g). Of the protein you eat each week, try to have at least 8 sounce (227 g) of seafood. This is about 2 servings per week. This includes salmon, trout, herring, sardines, and anchovies. Dairy Aim to eat 3 cup-equivalents of fat-free or low-fat dairy each day. Examples of 1 cup-equivalent of dairy include 1 cup (240 mL) milk, 8 ounces (250 g) yogurt, 1 ounces (44 g) natural cheese, or 1 cup (240 mL) fortified soy milk. Fats and oils Aim for about 5 teaspoons (21 g) of fats and oils per day. Choose monounsaturated fats, such as canola and olive oils, mayonnaise made with olive oil or avocado oil, avocados, peanut butter, and most nuts, or polyunsaturated fats, such as sunflower, corn, and soybean oils, walnuts, pine nuts, sesame seeds, sunflower seeds, and flaxseed. Beverages Aim for 6 eight-ounce glasses of water  per day. Limit coffee to 3-5 eight-ounce cups per day. Limit caffeinated beverages that have added calories, such as soda and energy drinks. If you drink alcohol: Limit how much you have to: 0-1 drink a day if you are male. 0-2 drinks a day if you are male. Know how much alcohol is in your drink. In the [place] one drink is one 12 oz bottle of beer (355 mL), one 5 oz glass of wine (  148 mL), or one 1 oz glass of hard liquor (44 mL). Seasoning and other foods Try not to add too much salt to your food. Try using herbs and spices instead of salt. Try not to add sugar to food. This information is based on [place] nutrition guidelines. To learn more, visit [employer]. Exact amounts may vary. You may need different amounts. This information is not intended to replace advice given to you by your health care provider. Make sure you discuss any questions you have with your health care provider. Document Revised: 11/26/2021 Document Reviewed: 11/26/2021 Elsevier Patient Education  2024 [employer].

## 2023-11-14 ENCOUNTER — Encounter: Payer: Self-pay | Admitting: Nurse Practitioner

## 2023-11-14 ENCOUNTER — Ambulatory Visit: Admitting: Nurse Practitioner

## 2023-11-14 VITALS — BP 123/85 | HR 68 | Temp 98.4°F | Resp 15 | Ht 70.0 in | Wt 225.6 lb

## 2023-11-14 DIAGNOSIS — G4733 Obstructive sleep apnea (adult) (pediatric): Secondary | ICD-10-CM | POA: Diagnosis not present

## 2023-11-14 DIAGNOSIS — J324 Chronic pansinusitis: Secondary | ICD-10-CM

## 2023-11-14 DIAGNOSIS — Z23 Encounter for immunization: Secondary | ICD-10-CM

## 2023-11-14 DIAGNOSIS — E66811 Obesity, class 1: Secondary | ICD-10-CM

## 2023-11-14 DIAGNOSIS — E6609 Other obesity due to excess calories: Secondary | ICD-10-CM

## 2023-11-14 DIAGNOSIS — Z6831 Body mass index (BMI) 31.0-31.9, adult: Secondary | ICD-10-CM

## 2023-11-14 DIAGNOSIS — E7800 Pure hypercholesterolemia, unspecified: Secondary | ICD-10-CM

## 2023-11-14 DIAGNOSIS — F418 Other specified anxiety disorders: Secondary | ICD-10-CM

## 2023-11-14 MED ORDER — AMOXICILLIN-POT CLAVULANATE 875-125 MG PO TABS: 1.0000 | ORAL_TABLET | Freq: Two times a day (BID) | ORAL | 0 refills | Status: AC

## 2023-11-14 MED ORDER — FLUTICASONE PROPIONATE 50 MCG/ACT NA SUSP: NASAL | 5 refills | Status: AC

## 2023-11-14 MED ORDER — PREDNISONE 20 MG PO TABS: 40.0000 mg | ORAL_TABLET | Freq: Every day | ORAL | 0 refills | Status: AC

## 2023-11-14 NOTE — Assessment & Plan Note (Signed)
BMI 32.37.  Recommended eating smaller high protein, low fat meals more frequently and exercising 30 mins a day 5 times a week with a goal of 10-15lb weight loss in the next 3 months. Patient voiced their understanding and motivation to adhere to these recommendations.

## 2023-11-14 NOTE — Assessment & Plan Note (Signed)
 Noted on past labs.  Continue focus on diet and regular activity. Recheck labs today. The 10-year ASCVD risk score (Arnett DK, et al., 2019) is: 1.6%   Values used to calculate the score:     Age: 48 years     Clincally relevant sex: Male     Is Non-Hispanic African American: No     Diabetic: No     Tobacco smoker: No     Systolic Blood Pressure: 123 mmHg     Is BP treated: No     HDL Cholesterol: 58 mg/dL     Total Cholesterol: 190 mg/dL

## 2023-11-14 NOTE — Assessment & Plan Note (Signed)
Chronic, ongoing.  Denies SI/HI.  Continue current medication regimen and adjust as needed.  Refills sent. 

## 2023-11-14 NOTE — Assessment & Plan Note (Signed)
 Chronic with current acute frontal sinus infection.  No benefit from recent Zpack.  Will send in Augmentin  and extend to 10 days total due to his history, Augmentin  has worked well in past, + start Prednisone  40 MG daily for 5 days.  Continue collaboration with ENT and continue current medication regimen at home.  If ongoing or worsening acute symptoms to return to office.

## 2023-11-14 NOTE — Progress Notes (Signed)
 BP 123/85 (BP Location: Left Arm, Patient Position: Sitting, Cuff Size: Large)   Pulse 68   Temp 98.4 F (36.9 C) (Oral)   Resp 15   Ht 5' 10 (1.778 m)   Wt 225 lb 9.6 oz (102.3 kg)   SpO2 98%   BMI 32.37 kg/m    Subjective:    Patient ID: Jon Saunders, male    DOB: [birth year], 48 y.o.   MRN: [id number]  HPI: Jon Saunders is a 48 y.o. male  Chief Complaint  Patient presents with   Sinus Problem    Abx on 08/20 through UC with low grade fever sinus pressure and drainage. Always blowing nose and pressure remains. Using flonase  regularly. Mentions wife also had similar symptoms. When blowing his nose very colorful. Sore throat only today.    UPPER RESPIRATORY TRACT INFECTION Has been present for 3 weeks, went on abx on 10/29/23 through [facility].  Since then he continues to blow nose and feel stuffy + has pressure.  Uses Flonase  and takes OTC antihistamine + does lavage.   Fever: no Cough: no Shortness of breath: through nose at night, has not been sleeping well Wheezing: no Chest pain: no Chest tightness: no Chest congestion: no Nasal congestion: yes Runny nose: yes Post nasal drip: yes Sneezing: no Sore throat: just this morning for a bit Swollen glands: no Sinus pressure: yes Headache: yes Face pain: yes Toothache: no Ear pain: none Ear pressure: yes bilateral Eyes red/itching:yes Eye drainage/crusting: no  Vomiting: no Rash: no Fatigue: yes Sick contacts: no Strep contacts: no  Context: fluctuating Recurrent sinusitis: yes -- saw ENT last in November 2024 Relief with OTC cold/cough medications: no  Treatments attempted: abx and above medications    DEPRESSION Continues on Fluoxetine . Mood status: stable Satisfied with current treatment?: yes Symptom severity: moderate  Duration of current treatment : chronic Side effects: no Medication compliance: good compliance Psychotherapy/counseling: none Depressed mood: no Anxious mood: no Anhedonia: no Significant  weight loss or gain: no Insomnia: none Fatigue: no Feelings of worthlessness or guilt: no Impaired concentration/indecisiveness: no Suicidal ideations: no Hopelessness: no Crying spells: no    11/14/2023    4:31 PM 08/26/2022    3:49 PM 10/01/2021    3:31 PM 07/25/2021    8:37 AM 07/05/2019    3:00 PM  Depression screen PHQ 2/9  Decreased Interest 0 0 0 0 0  Down, Depressed, Hopeless 0 0 0 0 0  PHQ - 2 Score 0 0 0 0 0  Altered sleeping 0 1 1 0 0  Tired, decreased energy 1 1 1 1 1   Change in appetite 0 0 0 0 0  Feeling bad or failure about yourself  0 0 0 0 0  Trouble concentrating 0 0 0 0 0  Moving slowly or fidgety/restless 0 0 0 0 0  Suicidal thoughts 0 0 0 0 0  PHQ-9 Score 1 2 2 1 1   Difficult doing work/chores Not difficult at all Not difficult at all Not difficult at all  Not difficult at all      11/14/2023    4:31 PM 08/26/2022    3:49 PM 10/01/2021    3:32 PM 07/25/2021    8:37 AM  GAD 7 : Generalized Anxiety Score  Nervous, Anxious, on Edge 0 0 0 0  Control/stop worrying 0 0 0 0  Worry too much - different things 0 0 0 0  Trouble relaxing 0 0 0 0  Restless 0 0 0 0  Easily annoyed or irritable 0 0 0 0  Afraid - awful might happen 0 0 0 0  Total GAD 7 Score 0 0 0 0  Anxiety Difficulty Not difficult at all Not difficult at all Not difficult at all Not difficult at all     Relevant past medical, surgical, family and social history reviewed and updated as indicated. Interim medical history since our last visit reviewed. Allergies and medications reviewed and updated.  Review of Systems  Constitutional:  Positive for fatigue. Negative for activity change, appetite change, chills, diaphoresis and fever.  HENT:  Positive for congestion, postnasal drip, rhinorrhea, sinus pressure, sinus pain and sore throat. Negative for ear discharge, ear pain, sneezing and voice change.   Respiratory:  Positive for shortness of breath (at night due to nasal congestion). Negative for cough,  chest tightness and wheezing.   Cardiovascular:  Negative for chest pain, palpitations and leg swelling.  Gastrointestinal: Negative.   Neurological: Negative.   Psychiatric/Behavioral: Negative.      Per HPI unless specifically indicated above     Objective:    BP 123/85 (BP Location: Left Arm, Patient Position: Sitting, Cuff Size: Large)   Pulse 68   Temp 98.4 F (36.9 C) (Oral)   Resp 15   Ht 5' 10 (1.778 m)   Wt 225 lb 9.6 oz (102.3 kg)   SpO2 98%   BMI 32.37 kg/m   Wt Readings from Last 3 Encounters:  11/14/23 225 lb 9.6 oz (102.3 kg)  01/10/23 228 lb 12.8 oz (103.8 kg)  08/26/22 236 lb 3.2 oz (107.1 kg)    Physical Exam Vitals and nursing note reviewed.  Constitutional:      General: He is awake. He is not in acute distress.    Appearance: He is well-developed and well-groomed. He is obese. He is not ill-appearing or toxic-appearing.  HENT:     Head: Normocephalic.     Right Ear: Hearing, ear canal and external ear normal. No tenderness. A middle ear effusion is present. Tympanic membrane is not injected or perforated.     Left Ear: Hearing, ear canal and external ear normal. No tenderness. A middle ear effusion is present. Tympanic membrane is not injected or perforated.     Nose: Rhinorrhea present. Rhinorrhea is clear.     Right Turbinates: Swollen.     Left Turbinates: Swollen.     Right Sinus: Frontal sinus tenderness present. No maxillary sinus tenderness.     Left Sinus: Frontal sinus tenderness present. No maxillary sinus tenderness.     Mouth/Throat:     Mouth: Mucous membranes are moist.     Pharynx: Posterior oropharyngeal erythema (mild) present. No pharyngeal swelling or oropharyngeal exudate.  Eyes:     General: Lids are normal.     Extraocular Movements: Extraocular movements intact.     Conjunctiva/sclera: Conjunctivae normal.  Neck:     Thyroid: No thyromegaly.     Vascular: No carotid bruit.  Cardiovascular:     Rate and Rhythm: Normal rate  and regular rhythm.     Heart sounds: Normal heart sounds.  Pulmonary:     Effort: No accessory muscle usage or respiratory distress.     Breath sounds: Normal breath sounds. No decreased breath sounds, wheezing or rales.  Abdominal:     General: Bowel sounds are normal. There is no distension.     Palpations: Abdomen is soft.     Tenderness: There is no abdominal tenderness.  Musculoskeletal:  Cervical back: Full passive range of motion without pain.     Right lower leg: No edema.     Left lower leg: No edema.  Lymphadenopathy:     Cervical: No cervical adenopathy.  Skin:    General: Skin is warm.     Capillary Refill: Capillary refill takes less than 2 seconds.  Neurological:     Mental Status: He is alert and oriented to person, place, and time.     Deep Tendon Reflexes: Reflexes are normal and symmetric.     Reflex Scores:      Brachioradialis reflexes are 2+ on the right side and 2+ on the left side.      Patellar reflexes are 2+ on the right side and 2+ on the left side. Psychiatric:        Attention and Perception: Attention normal.        Mood and Affect: Mood normal.        Speech: Speech normal.        Behavior: Behavior normal. Behavior is cooperative.        Thought Content: Thought content normal.    Results for orders placed or performed in visit on 08/26/22  CBC with Differential/Platelet   Collection Time: 08/26/22  4:37 PM  Result Value Ref Range   WBC 9.4 3.4 - 10.8 x10E3/uL   RBC 4.88 4.14 - 5.80 x10E6/uL   Hemoglobin 15.2 13.0 - 17.7 g/dL   Hematocrit 56.1 62.4 - 51.0 %   MCV 90 79 - 97 fL   MCH 31.1 26.6 - 33.0 pg   MCHC 34.7 31.5 - 35.7 g/dL   RDW 87.0 88.3 - 84.5 %   Platelets 215 150 - 450 x10E3/uL   Neutrophils 63 Not Estab. %   Lymphs 26 Not Estab. %   Monocytes 7 Not Estab. %   Eos 4 Not Estab. %   Basos 0 Not Estab. %   Neutrophils Absolute 5.9 1.4 - 7.0 x10E3/uL   Lymphocytes Absolute 2.4 0.7 - 3.1 x10E3/uL   Monocytes Absolute 0.7  0.1 - 0.9 x10E3/uL   EOS (ABSOLUTE) 0.4 0.0 - 0.4 x10E3/uL   Basophils Absolute 0.0 0.0 - 0.2 x10E3/uL   Immature Granulocytes 0 Not Estab. %   Immature Grans (Abs) 0.0 0.0 - 0.1 x10E3/uL  Comprehensive metabolic panel   Collection Time: 08/26/22  4:37 PM  Result Value Ref Range   Glucose 88 70 - 99 mg/dL   BUN 13 6 - 24 mg/dL   Creatinine, Ser 8.93 0.76 - 1.27 mg/dL   eGFR 88 >40 fO/fpw/8.26   BUN/Creatinine Ratio 12 9 - 20   Sodium 139 134 - 144 mmol/L   Potassium 3.9 3.5 - 5.2 mmol/L   Chloride 103 96 - 106 mmol/L   CO2 24 20 - 29 mmol/L   Calcium 9.5 8.7 - 10.2 mg/dL   Total Protein 7.0 6.0 - 8.5 g/dL   Albumin 4.4 4.1 - 5.1 g/dL   Globulin, Total 2.6 1.5 - 4.5 g/dL   Bilirubin Total 0.3 0.0 - 1.2 mg/dL   Alkaline Phosphatase 81 44 - 121 IU/L   AST 20 0 - 40 IU/L   ALT 20 0 - 44 IU/L  Lipid Panel w/o Chol/HDL Ratio   Collection Time: 08/26/22  4:37 PM  Result Value Ref Range   Cholesterol, Total 190 100 - 199 mg/dL   Triglycerides 810 (H) 0 - 149 mg/dL   HDL 58 >60 mg/dL   VLDL Cholesterol Cal 32 5 -  40 mg/dL   LDL Chol Calc (NIH) 899 (H) 0 - 99 mg/dL  TSH   Collection Time: 08/26/22  4:37 PM  Result Value Ref Range   TSH 2.200 0.450 - 4.500 uIU/mL      Assessment & Plan:   Problem List Items Addressed This Visit       Respiratory   OSA on CPAP   Chronic, stable with CPAP use regularly.  100% use unless sinus infection present.       Chronic pansinusitis   Chronic with current acute frontal sinus infection.  No benefit from recent Zpack.  Will send in Augmentin  and extend to 10 days total due to his history, Augmentin  has worked well in past, + start Prednisone  40 MG daily for 5 days.  Continue collaboration with ENT and continue current medication regimen at home.  If ongoing or worsening acute symptoms to return to office.      Relevant Medications   fluticasone  (FLONASE ) 50 MCG/ACT nasal spray   amoxicillin -clavulanate (AUGMENTIN ) 875-125 MG tablet    predniSONE  (DELTASONE ) 20 MG tablet   Other Relevant Orders   CBC with Differential/Platelet     Other   Depression with anxiety - Primary (Chronic)   Chronic, ongoing.  Denies SI/HI.  Continue current medication regimen and adjust as needed.  Refills sent.      Relevant Orders   TSH   Obesity   BMI 32.37.  Recommended eating smaller high protein, low fat meals more frequently and exercising 30 mins a day 5 times a week with a goal of 10-15lb weight loss in the next 3 months. Patient voiced their understanding and motivation to adhere to these recommendations.       Elevated low density lipoprotein (LDL) cholesterol level   Noted on past labs.  Continue focus on diet and regular activity. Recheck labs today. The 10-year ASCVD risk score (Arnett DK, et al., 2019) is: 1.6%   Values used to calculate the score:     Age: 45 years     Clincally relevant sex: Male     Is Non-Hispanic African American: No     Diabetic: No     Tobacco smoker: No     Systolic Blood Pressure: 123 mmHg     Is BP treated: No     HDL Cholesterol: 58 mg/dL     Total Cholesterol: 190 mg/dL       Relevant Orders   Comprehensive metabolic panel with GFR   Lipid Panel w/o Chol/HDL Ratio     Follow up plan: Return in about 1 year (around 11/13/2024) for Annual Physical.

## 2023-11-14 NOTE — Assessment & Plan Note (Signed)
 Chronic, stable with CPAP use regularly.  100% use unless sinus infection present.

## 2023-11-15 ENCOUNTER — Ambulatory Visit: Payer: Self-pay | Admitting: Nurse Practitioner

## 2023-11-15 LAB — COMPREHENSIVE METABOLIC PANEL WITH GFR
ALT: 24 IU/L (ref 0–44)
AST: 22 IU/L (ref 0–40)
Albumin: 4.5 g/dL (ref 4.1–5.1)
Alkaline Phosphatase: 91 IU/L (ref 44–121)
BUN/Creatinine Ratio: 12 (ref 9–20)
BUN: 13 mg/dL (ref 6–24)
Bilirubin Total: 0.4 mg/dL (ref 0.0–1.2)
CO2: 22 mmol/L (ref 20–29)
Calcium: 9.3 mg/dL (ref 8.7–10.2)
Chloride: 101 mmol/L (ref 96–106)
Creatinine, Ser: 1.09 mg/dL (ref 0.76–1.27)
Globulin, Total: 2.5 g/dL (ref 1.5–4.5)
Glucose: 77 mg/dL (ref 70–99)
Potassium: 4.4 mmol/L (ref 3.5–5.2)
Sodium: 139 mmol/L (ref 134–144)
Total Protein: 7 g/dL (ref 6.0–8.5)
eGFR: 85 mL/min/1.73 (ref >59)

## 2023-11-15 LAB — CBC WITH DIFFERENTIAL/PLATELET
Basophils Absolute: 0.1 x10E3/uL (ref 0.0–0.2)
Basos: 1 %
EOS (ABSOLUTE): 0.5 x10E3/uL — ABNORMAL HIGH (ref 0.0–0.4)
Eos: 4 %
Hematocrit: 46.5 % (ref 37.5–51.0)
Hemoglobin: 15.4 g/dL (ref 13.0–17.7)
Immature Grans (Abs): 0 x10E3/uL (ref 0.0–0.1)
Immature Granulocytes: 0 %
Lymphocytes Absolute: 2.8 x10E3/uL (ref 0.7–3.1)
Lymphs: 23 %
MCH: 30.3 pg (ref 26.6–33.0)
MCHC: 33.1 g/dL (ref 31.5–35.7)
MCV: 92 fL (ref 79–97)
Monocytes Absolute: 0.8 x10E3/uL (ref 0.1–0.9)
Monocytes: 7 %
Neutrophils Absolute: 7.8 x10E3/uL — ABNORMAL HIGH (ref 1.4–7.0)
Neutrophils: 65 %
Platelets: 237 x10E3/uL (ref 150–450)
RBC: 5.08 x10E6/uL (ref 4.14–5.80)
RDW: 13 % (ref 11.6–15.4)
WBC: 11.9 x10E3/uL — ABNORMAL HIGH (ref 3.4–10.8)

## 2023-11-15 LAB — LIPID PANEL W/O CHOL/HDL RATIO
Cholesterol, Total: 172 mg/dL (ref 100–199)
HDL: 59 mg/dL (ref >39)
LDL Chol Calc (NIH): 96 mg/dL (ref 0–99)
Triglycerides: 96 mg/dL (ref 0–149)
VLDL Cholesterol Cal: 17 mg/dL (ref 5–40)

## 2023-11-15 LAB — TSH: TSH: 3.02 u[IU]/mL (ref 0.450–4.500)

## 2023-11-15 NOTE — Progress Notes (Signed)
 Contacted via MyChart  Good morning Jon Saunders, your labs have returned and overall look great with exception of white blood cell count and neutrophils, but this is related to your current sinus infection. Definitely complete all of the Augmentin  and Prednisone .  If symptoms continue after all complete let me know. Keep being amazing!!  Thank you for allowing me to participate in your care.  I appreciate you. Kindest regards, Gordon Vandunk

## 2023-11-24 DIAGNOSIS — J324 Chronic pansinusitis: Secondary | ICD-10-CM | POA: Diagnosis not present

## 2024-02-24 ENCOUNTER — Inpatient Hospital Stay
Admission: RE | Admit: 2024-02-24 | Discharge: 2024-02-24 | Attending: Emergency Medicine | Admitting: Emergency Medicine

## 2024-02-24 VITALS — BP 124/81 | HR 71 | Temp 98.4°F | Resp 18

## 2024-02-24 DIAGNOSIS — H938X2 Other specified disorders of left ear: Secondary | ICD-10-CM

## 2024-02-24 MED ORDER — PREDNISONE 10 MG (21) PO TBPK: ORAL_TABLET | Freq: Every day | ORAL | 0 refills | Status: AC

## 2024-02-24 MED ORDER — DOXYCYCLINE HYCLATE 100 MG PO CAPS: 100.0000 mg | ORAL_CAPSULE | Freq: Two times a day (BID) | ORAL | 0 refills | Status: AC

## 2024-02-24 NOTE — Discharge Instructions (Addendum)
 Today you are evaluated for your fullness and pain on exam there is no abnormality to the ear however on exam when tapping over the frontal sinus you are experiencing sensations within the ear and I do believe this is most likely the cause of your symptoms  Take doxycycline   twice daily for 7 days for bacterial coverage  Take prednisone  every morning with food to reduce pressure within the ear, avoid ibuprofen  while taking but may use Tylenol   May use over-the-counter medicine such as nasal spray Sudafed etc. to help further reduce congestion which could be contributing to symptoms  If symptoms continue to persist or worsen please follow-up with your nose and throat for reevaluation

## 2024-02-24 NOTE — ED Triage Notes (Signed)
 Patient complains of left ear fullness and pain to outer left ear x 1 week. Patient also says he hears clicking in his ear and he is also nauseated. Patient has taken Ibuprofen  for pain with mild relief.

## 2024-02-24 NOTE — ED Provider Notes (Signed)
 Jon Saunders    CSN: [id number] Arrival date & time: 02/24/24  1021      History   Chief Complaint Chief Complaint  Patient presents with   Ear Fullness    Left ear sore, clicking noises. - Entered by patient   Otalgia   Nausea    HPI Jon Saunders is a 48 y.o. male.   Patient presents for evaluation of left-sided ear fullness and pain present for 7 days.  Occurring intermittently.  Has experienced a clicking sound exacerbated by loud noises and running water.  Has had associated nausea which she believes is related to postnasal drainage, endorses mild congestion but denies fever.  History of sinus surgery and endorses after every time he blows his nose he can feel it within his ear.  Has attempted ibuprofen .  Denies decreased hearing.    Past Medical History:  Diagnosis Date   Allergy     Anxiety    Meningitis    [place] spotted fever     Patient Active Problem List   Diagnosis Date Noted   Tinnitus of left ear 01/10/2023   OSA on CPAP 08/25/2022   Elevated low density lipoprotein (LDL) cholesterol level 08/25/2022   Chronic pansinusitis 01/17/2021   History of meningitis 12/10/2020   History of [place] spotted fever 05/18/2019   Obesity 05/18/2019   Depression with anxiety 05/18/2019    Past Surgical History:  Procedure Laterality Date   WISDOM TOOTH EXTRACTION         Home Medications    Prior to Admission medications  Medication Sig Start Date End Date Taking? Authorizing Provider  FLUoxetine  (PROZAC ) 40 MG capsule TAKE 1 CAPSULE (40 MG TOTAL) BY MOUTH DAILY. 11/13/23   Cannady, Jolene T, NP  fluticasone  (FLONASE ) 50 MCG/ACT nasal spray SPRAY 2 SPRAYS INTO EACH NOSTRIL 2x daily 11/14/23   Cannady, Jolene T, NP    Family History Family History  Problem Relation Age of Onset   Hypertension Mother    Epilepsy Mother    Seizures Mother    Migraines Father    Alzheimer's disease Father    Epilepsy Sister    Seizures Sister     Ovarian cancer Maternal Grandmother     Social History Social History[1]   Allergies   Patient has no known allergies.   Review of Systems Review of Systems  HENT:  Negative for ear pain.      Physical Exam Triage Vital Signs ED Triage Vitals  Encounter Vitals Group     BP 02/24/24 1102 124/81     Girls Systolic BP Percentile --      Girls Diastolic BP Percentile --      Boys Systolic BP Percentile --      Boys Diastolic BP Percentile --      Pulse Rate 02/24/24 1102 71     Resp 02/24/24 1102 18     Temp 02/24/24 1102 98.4 F (36.9 C)     Temp Source 02/24/24 1102 Oral     SpO2 02/24/24 1102 95 %     Weight --      Height --      Head Circumference --      Peak Flow --      Pain Score 02/24/24 1059 3     Pain Loc --      Pain Education --      Exclude from Growth Chart --    No data found.  Updated Vital Signs BP 124/81 (BP  Location: Left Arm)   Pulse 71   Temp 98.4 F (36.9 C) (Oral)   Resp 18   SpO2 95%   Visual Acuity Right Eye Distance:   Left Eye Distance:   Bilateral Distance:    Right Eye Near:   Left Eye Near:    Bilateral Near:     Physical Exam HENT:     Right Ear: Tympanic membrane, ear canal and external ear normal.     Left Ear: Tympanic membrane, ear canal and external ear normal.     Nose:     Left Sinus: Frontal sinus tenderness present.      UC Treatments / Results  Labs (all labs ordered are listed, but only abnormal results are displayed) Labs Reviewed - No data to display  EKG   Radiology No results found.  Procedures Procedures (including critical care time)  Medications Ordered in UC Medications - No data to display  Initial Impression / Assessment and Plan / UC Course  I have reviewed the triage vital signs and the nursing notes.  Pertinent labs & imaging results that were available during my care of the patient were reviewed by me and considered in my medical decision making (see chart for  details).  Left ear fullness  Vitals are stable, patient in no signs of distress nor toxic appearing, no abnormality noted to the ear however sinus tenderness noted to the left frontal aspect most likely related, discussed this with patient, symptoms persisting for 7 days empirically placed on azithromycin  as well as prescribed prednisone  recommend over-the-counter decongestants and advised follow-up with ear nose and throat if symptoms persist Final Clinical Impressions(s) / UC Diagnoses   Final diagnoses:  None   Discharge Instructions   None    ED Prescriptions   None    PDMP not reviewed this encounter.     [1]  Social History Tobacco Use   Smoking status: Never   Smokeless tobacco: Never  Vaping Use   Vaping status: Never Used  Substance Use Topics   Alcohol use: Yes    Alcohol/week: 4.0 standard drinks of alcohol    Types: 1 Glasses of wine, 3 Standard drinks or equivalent per week    Comment: every couple days    Drug use: Never     Teresa Shelba SAUNDERS, NP 02/24/24 1230

## 2024-03-04 ENCOUNTER — Emergency Department
Admission: EM | Admit: 2024-03-04 | Discharge: 2024-03-04 | Disposition: A | Discharge status: Home / Self Care | Attending: Emergency Medicine | Admitting: Emergency Medicine

## 2024-03-04 ENCOUNTER — Other Ambulatory Visit: Payer: Self-pay

## 2024-03-04 DIAGNOSIS — W268XXA Contact with other sharp object(s), not elsewhere classified, initial encounter: Secondary | ICD-10-CM | POA: Insufficient documentation

## 2024-03-04 DIAGNOSIS — S61211A Laceration without foreign body of left index finger without damage to nail, initial encounter: Secondary | ICD-10-CM | POA: Insufficient documentation

## 2024-03-04 DIAGNOSIS — S6992XA Unspecified injury of left wrist, hand and finger(s), initial encounter: Secondary | ICD-10-CM | POA: Diagnosis not present

## 2024-03-04 MED ORDER — CEPHALEXIN 500 MG PO CAPS: 500.0000 mg | ORAL_CAPSULE | Freq: Two times a day (BID) | ORAL | 0 refills | Status: AC

## 2024-03-04 MED ORDER — LIDOCAINE-EPINEPHRINE-TETRACAINE (LET) TOPICAL GEL
3.0000 mL | Freq: Once | TOPICAL | Status: AC
  Administered 2024-03-04: 3 mL via TOPICAL
  Filled 2024-03-04: qty 3

## 2024-03-04 NOTE — Discharge Instructions (Signed)
 If you have skin tapes (Steristrips) or skin glue (Dermabond) on your incision, leave them in place. They will fall off on their own like a scab in a few weeks.  You may trim any edges that curl up with clean scissors.   Your tetanus is up-to-date.

## 2024-03-04 NOTE — ED Provider Notes (Signed)
 "   [facility] Emergency Department Provider Note     Event Date/Time   First MD Initiated Contact with Patient 03/04/24 1439     (approximate)   History   Laceration   HPI  Jon Saunders is a 48 y.o. male presents to the ED with a laceration to his left index finger.  Patient reports he was cutting leather with a razor and sliced his finger.  Endorses some decreased sensation to the tip of his finger, otherwise normal.  Nail remains intact.  Full range of motion of finger.     Physical Exam   Triage Vital Signs: ED Triage Vitals  Encounter Vitals Group     BP 03/04/24 1358 126/83     Girls Systolic BP Percentile --      Girls Diastolic BP Percentile --      Boys Systolic BP Percentile --      Boys Diastolic BP Percentile --      Pulse Rate 03/04/24 1358 90     Resp 03/04/24 1358 16     Temp 03/04/24 1358 98.3 F (36.8 C)     Temp Source 03/04/24 1358 Oral     SpO2 03/04/24 1358 98 %     Weight 03/04/24 1403 230 lb (104.3 kg)     Height 03/04/24 1403 5' 10 (1.778 m)     Head Circumference --      Peak Flow --      Pain Score 03/04/24 1401 3     Pain Loc --      Pain Education --      Exclude from Growth Chart --     Most recent vital signs: Vitals:   03/04/24 1440 03/04/24 1549  BP:  128/84  Pulse:  84  Resp:  18  Temp:    SpO2: 100% 100%    General Awake, no distress.  HEENT NCAT.  CV:  Good peripheral perfusion.  RESP:  Normal effort.  ABD:  No distention.  Other:  Avulsed fat pad of the distal radial aspect of the left index finger.  No nail involvement.  Good capillary refill.  Sensation remains intact.   ED Results / Procedures / Treatments   Labs (all labs ordered are listed, but only abnormal results are displayed) Labs Reviewed - No data to display  No results found.  PROCEDURES:  Critical Care performed: No  .Laceration Repair  Date/Time: 03/04/2024 4:47 PM  Performed by: Margrette Rebbeca LABOR,  PA-C Authorized by: Margrette Rebbeca A, PA-C   Consent:    Consent obtained:  Verbal   Consent given by:  Patient   Risks discussed:  Infection, need for additional repair, poor cosmetic result, poor wound healing, nerve damage, vascular damage and pain Anesthesia:    Anesthesia method:  Topical application   Topical anesthetic:  LET Laceration details:    Location:  Finger   Finger location:  L index finger   Length (cm):  1   Depth (mm):  0.5 Exploration:    Hemostasis achieved with:  LET and tourniquet Treatment:    Area cleansed with:  Chlorhexidine   Amount of cleaning:  Standard   Irrigation method:  Pressure wash Skin repair:    Repair method:  Tissue adhesive Repair type:    Repair type:  Simple Post-procedure details:    Dressing: bandaid applied.   Procedure completion:  Tolerated    MEDICATIONS ORDERED IN ED: Medications  lidocaine -EPINEPHrine -tetracaine  (LET) topical gel (3 mLs Topical Given  03/04/24 1455)     IMPRESSION / MDM / ASSESSMENT AND PLAN / ED COURSE  I reviewed the triage vital signs and the nursing notes.                              Clinical Course as of 03/04/24 1648  Thu Mar 04, 2024  1511 Tdap 2019 [MH]    Clinical Course User Index [MH] Margrette Rebbeca LABOR, PA-C    48 y.o. male presents to the emergency department for evaluation and treatment of finger laceration. See HPI for further details.   Differential diagnosis includes, but is not limited to laceration, abrasion, avulsion  Patient's presentation is most consistent with acute, uncomplicated illness.  Patient is alert and oriented.  Physical exam findings are stated above.  Please see procedure note for laceration repair.  There was no skin to approximate edges for suture placement.  Patient's fat pad was avulsed.  Will need to heal via secondary granulation.  Tourniquet applied to finger with successful hemostasis.  Let placed on area of avulsed skin.  Hemostasis met.  This was  thoroughly cleansed and irrigated with saline.  Dermabond applied.  Tourniquet removed and patient is in stable condition for discharge home.  Short course of antibiotics sent to pharmacy.  Last Tdap in 2019.  No indication for update during today's visit.  ED return precautions were discussed.  FINAL CLINICAL IMPRESSION(S) / ED DIAGNOSES   Final diagnoses:  Laceration of left index finger without foreign body without damage to nail, initial encounter   Rx / DC Orders   ED Discharge Orders          Ordered    cephALEXin  (KEFLEX ) 500 MG capsule  2 times daily        03/04/24 1546             Note:  This document was prepared using Dragon voice recognition software and may include unintentional dictation errors.    Margrette Rebbeca A, PA-C 03/04/24 1648  "

## 2024-03-04 NOTE — ED Triage Notes (Signed)
 Pt to ED POV for laceration to L index finger currently bandaged from a razor knife while cutting leather. Last Tdap around 2019.

## 2024-04-12 ENCOUNTER — Ambulatory Visit: Payer: Self-pay

## 2024-11-17 ENCOUNTER — Encounter: Admitting: Nurse Practitioner
